# Patient Record
Sex: Female | Born: 2000 | Race: White | Hispanic: No | Marital: Married | Smoking: Never smoker
Health system: Southern US, Community
[De-identification: ages and names within clinical notes are randomized; demographics above are authoritative.]

## PROBLEM LIST (undated history)

## (undated) DIAGNOSIS — Q796 Ehlers-Danlos syndrome, unspecified: Secondary | ICD-10-CM

## (undated) DIAGNOSIS — G43909 Migraine, unspecified, not intractable, without status migrainosus: Secondary | ICD-10-CM

## (undated) DIAGNOSIS — Z8739 Personal history of other diseases of the musculoskeletal system and connective tissue: Secondary | ICD-10-CM

## (undated) DIAGNOSIS — K589 Irritable bowel syndrome without diarrhea: Secondary | ICD-10-CM

## (undated) DIAGNOSIS — F32A Depression, unspecified: Secondary | ICD-10-CM

## (undated) DIAGNOSIS — R519 Headache, unspecified: Secondary | ICD-10-CM

## (undated) DIAGNOSIS — K219 Gastro-esophageal reflux disease without esophagitis: Secondary | ICD-10-CM

## (undated) DIAGNOSIS — R55 Syncope and collapse: Secondary | ICD-10-CM

## (undated) DIAGNOSIS — K6289 Other specified diseases of anus and rectum: Secondary | ICD-10-CM

## (undated) DIAGNOSIS — S060X9A Concussion with loss of consciousness of unspecified duration, initial encounter: Secondary | ICD-10-CM

## (undated) DIAGNOSIS — M1990 Unspecified osteoarthritis, unspecified site: Secondary | ICD-10-CM

## (undated) DIAGNOSIS — M5418 Radiculopathy, sacral and sacrococcygeal region: Secondary | ICD-10-CM

## (undated) DIAGNOSIS — G90A Postural orthostatic tachycardia syndrome (POTS): Secondary | ICD-10-CM

## (undated) DIAGNOSIS — J45990 Exercise induced bronchospasm: Secondary | ICD-10-CM

## (undated) DIAGNOSIS — I951 Orthostatic hypotension: Secondary | ICD-10-CM

## (undated) DIAGNOSIS — R51 Headache: Secondary | ICD-10-CM

## (undated) DIAGNOSIS — T7840XA Allergy, unspecified, initial encounter: Secondary | ICD-10-CM

## (undated) DIAGNOSIS — G90521 Complex regional pain syndrome I of right lower limb: Secondary | ICD-10-CM

## (undated) DIAGNOSIS — Z973 Presence of spectacles and contact lenses: Secondary | ICD-10-CM

## (undated) DIAGNOSIS — G43009 Migraine without aura, not intractable, without status migrainosus: Secondary | ICD-10-CM

## (undated) DIAGNOSIS — Z87898 Personal history of other specified conditions: Secondary | ICD-10-CM

## (undated) DIAGNOSIS — J45909 Unspecified asthma, uncomplicated: Secondary | ICD-10-CM

## (undated) DIAGNOSIS — M797 Fibromyalgia: Secondary | ICD-10-CM

## (undated) DIAGNOSIS — G90523 Complex regional pain syndrome I of lower limb, bilateral: Secondary | ICD-10-CM

## (undated) DIAGNOSIS — F419 Anxiety disorder, unspecified: Secondary | ICD-10-CM

## (undated) DIAGNOSIS — M4317 Spondylolisthesis, lumbosacral region: Secondary | ICD-10-CM

## (undated) HISTORY — PX: WISDOM TOOTH EXTRACTION: SHX21

## (undated) HISTORY — DX: Syncope and collapse: R55

## (undated) HISTORY — DX: Headache: R51

## (undated) HISTORY — DX: Migraine without aura, not intractable, without status migrainosus: G43.009

## (undated) HISTORY — DX: Postural orthostatic tachycardia syndrome (POTS): G90.A

## (undated) HISTORY — DX: Allergy, unspecified, initial encounter: T78.40XA

## (undated) HISTORY — DX: Orthostatic hypotension: I95.1

## (undated) HISTORY — DX: Fibromyalgia: M79.7

## (undated) HISTORY — DX: Ehlers-Danlos syndrome, unspecified: Q79.60

## (undated) HISTORY — DX: Unspecified osteoarthritis, unspecified site: M19.90

## (undated) HISTORY — DX: Gastro-esophageal reflux disease without esophagitis: K21.9

## (undated) HISTORY — DX: Complex regional pain syndrome i of right lower limb: G90.521

## (undated) HISTORY — DX: Unspecified asthma, uncomplicated: J45.909

## (undated) HISTORY — DX: Concussion with loss of consciousness of unspecified duration, initial encounter: S06.0X9A

## (undated) HISTORY — DX: Migraine, unspecified, not intractable, without status migrainosus: G43.909

---
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
## 2021-01-01 ENCOUNTER — Other Ambulatory Visit: Payer: Self-pay

## 2021-01-01 ENCOUNTER — Encounter: Payer: Self-pay | Admitting: Neurology

## 2021-01-01 ENCOUNTER — Ambulatory Visit: Payer: No Typology Code available for payment source | Admitting: Neurology

## 2021-01-01 VITALS — BP 120/77 | HR 93 | Resp 18 | Ht 67.0 in | Wt 153.0 lb

## 2021-01-01 DIAGNOSIS — R55 Syncope and collapse: Secondary | ICD-10-CM

## 2021-01-01 DIAGNOSIS — R200 Anesthesia of skin: Secondary | ICD-10-CM | POA: Diagnosis not present

## 2021-01-01 DIAGNOSIS — R202 Paresthesia of skin: Secondary | ICD-10-CM

## 2021-01-01 DIAGNOSIS — G43009 Migraine without aura, not intractable, without status migrainosus: Secondary | ICD-10-CM

## 2021-01-01 DIAGNOSIS — G90521 Complex regional pain syndrome I of right lower limb: Secondary | ICD-10-CM

## 2021-01-01 NOTE — Patient Instructions (Addendum)
  1. Will check MRI of brain with and without contrast 2. Will check routine EEG.  If unremarkable, will check ambulatory EEG 3. Start Qulipta 60mg  daily  4. Take sumatriptan 60mg  at earliest onset of headache.  May repeat dose once in 2 hours if needed.  Maximum 2 tablets in 24 hours. 5. Limit use of pain relievers to no more than 2 days out of the week.  These medications include acetaminophen, NSAIDs (ibuprofen/Advil/Motrin, naproxen/Aleve, triptans (Imitrex/sumatriptan), Excedrin, and narcotics.  This will help reduce risk of rebound headaches. 6. Be aware of common food triggers:  - Caffeine:  coffee, black tea, cola, Mt. Dew  - Chocolate  - Dairy:  aged cheeses (brie, blue, cheddar, gouda, [place], provolone, [place], Swiss, etc), chocolate milk, buttermilk, sour cream, limit eggs and yogurt  - Nuts, peanut butter  - Alcohol  - Cereals/grains:  FRESH breads (fresh bagels, sourdough, doughnuts), yeast productions  - Processed/canned/aged/cured meats (pre-packaged deli meats, hotdogs)  - MSG/glutamate:  soy sauce, flavor enhancer, pickled/preserved/marinated foods  - Sweeteners:  aspartame (Equal, Nutrasweet).  Sugar and Splenda are okay  - Vegetables:  legumes (lima beans, lentils, snow peas, fava beans, pinto peans, peas, garbanzo beans), sauerkraut, onions, olives, pickles  - Fruit:  avocados, bananas, citrus fruit (orange, lemon, grapefruit), mango  - Other:  Frozen meals, macaroni and cheese 7. Routine exercise 8. Stay adequately hydrated (aim for 64 oz water daily) 9. Keep headache diary 10. Maintain proper stress management 11. Maintain proper sleep hygiene 12. Do not skip meals 13. Consider supplements:  magnesium citrate 400mg  daily, riboflavin 400mg  daily, coenzyme Q10 100mg  three times daily.    We have sent a referral to [facility] for your MRI and they will call you directly to schedule your appointment. They are located at [address] [facility]. If you need to  contact them directly please call [phone].

## 2021-01-07 ENCOUNTER — Other Ambulatory Visit: Payer: Self-pay | Admitting: Cardiology

## 2021-01-07 ENCOUNTER — Encounter: Payer: Self-pay | Admitting: Family Medicine

## 2021-01-07 DIAGNOSIS — G901 Familial dysautonomia [Riley-Day]: Secondary | ICD-10-CM

## 2021-01-07 MED ORDER — MIDODRINE HCL 2.5 MG PO TABS: 2.5000 mg | ORAL_TABLET | Freq: Two times a day (BID) | ORAL | 1 refills | Status: DC

## 2021-01-07 MED ORDER — MIDODRINE HCL 2.5 MG PO TABS: 2.5000 mg | ORAL_TABLET | Freq: Two times a day (BID) | ORAL | Status: DC

## 2021-01-10 ENCOUNTER — Ambulatory Visit (INDEPENDENT_AMBULATORY_CARE_PROVIDER_SITE_OTHER): Payer: No Typology Code available for payment source | Admitting: Neurology

## 2021-01-10 ENCOUNTER — Other Ambulatory Visit: Payer: Self-pay | Admitting: Neurology

## 2021-01-10 ENCOUNTER — Other Ambulatory Visit: Payer: Self-pay

## 2021-01-10 DIAGNOSIS — G43009 Migraine without aura, not intractable, without status migrainosus: Secondary | ICD-10-CM

## 2021-01-10 DIAGNOSIS — R200 Anesthesia of skin: Secondary | ICD-10-CM

## 2021-01-10 DIAGNOSIS — R202 Paresthesia of skin: Secondary | ICD-10-CM

## 2021-01-10 DIAGNOSIS — G90521 Complex regional pain syndrome I of right lower limb: Secondary | ICD-10-CM

## 2021-01-10 DIAGNOSIS — R55 Syncope and collapse: Secondary | ICD-10-CM

## 2021-01-10 MED ORDER — QULIPTA 60 MG PO TABS: 60.0000 mg | ORAL_TABLET | Freq: Every day | ORAL | 5 refills | Status: DC

## 2021-01-10 NOTE — Procedures (Signed)
ELECTROENCEPHALOGRAM REPORT  Date of Study: 01/10/2021  Patient's Name: Kiara Brewer MRN: [id number] Date of Birth: Jun 17, 2001   Clinical History: 20 year old female with migraines and chronic pain presenting with recurrent syncope  Medications: PROVENTIL HFA;VENTOLIN HFA 108 (90 Base) MCG/ACT inhaler WELLBUTRIN XL 300 MG 24 hr tablet TUMS - DOSED IN MG ELEMENTAL CALCIUM 500 MG chewable tablet CYMBALTA 60 MG capsule EPINEPHrine 0.3 mg/0.3 mL IJ SOAJ injection JUNEL 1/20 1-20 MG-MCG tablet KEPPRA 1000 MG tablet SINGULAIR 10 MG tablet MULTIVITAMIN tablet IMITREX 50 MG tablet ZANAFLEX 4 MG tablet  Technical Summary: A multichannel digital EEG recording measured by the international 10-20 system with electrodes applied with paste and impedances below 5000 ohms performed in our laboratory with EKG monitoring in an awake and asleep patient.  Photic stimulation was performed.  The digital EEG was referentially recorded, reformatted, and digitally filtered in a variety of bipolar and referential montages for optimal display.    Description: The patient is awake and asleep during the recording.  During maximal wakefulness, there is a symmetric, medium voltage 10 Hz posterior dominant rhythm that attenuates with eye opening.  The record is symmetric.  During drowsiness and sleep, there is an increase in theta slowing of the background.  Vertex waves and symmetric sleep spindles were seen.  Photic stimulation did not elicit any abnormalities.  There were no epileptiform discharges or electrographic seizures seen.    EKG lead was unremarkable.  Impression: This awake and asleep EEG is normal.    Clinical Correlation: A normal EEG does not exclude a clinical diagnosis of epilepsy.  If further clinical questions remain, prolonged EEG may be helpful.  Clinical correlation is advised.   Shon Millet, DO

## 2021-01-13 NOTE — Progress Notes (Unsigned)
Cardiology Office Note:    Date:  01/15/2021   ID:  Kiara Brewer, DOB 01/01/2001, MRN [id number]  PCP:  Orland MustardWolfe, Allison, MD   [facility]  Cardiologist:  No primary care provider on file.  Advanced Practice Provider:  No care team member to display Electrophysiologist:  None    Referring MD: Orland MustardWolfe, Allison, MD     History of Present Illness:    Kiara Brewer is a 20 y.o. female with a hx of  anxiety, complex regional pain syndrome, fibromyalgia, IBS, GERD and orthostatic hypotension who presents to clinic for follow-up of her syncope  Patient was initially seen by Dr. Lalla BrothersLambert on 09/07/20 where she presented with multiple episodes of orthostatic hypotension and passing out spells. Episodes frequently occurred with changes of position although not always. One of the episodes was preceded by facial numbness. When she passes out she is unconscious for just a couple of seconds and then regains consciousness. Almost every time she changes position from sitting to standing she feels lightheaded. She says she drinks at least a gallon of water daily. During that visit, the patient symptoms were thought to be due to orthostatic intolerance with concern that polypharmacy is contributing. Recommended to see psychiatry to help with medication titration. There was low suspicion for Ehlers-Danlos or Marfan's. Was recommended for cardiac monitor, but could only tolerate for 4 days due to skin irritation. Monitor showed HR [id number] avg 93bpm. Patient triggered events corresponded to sinus rhythm/sinus tachycardia. No SVT. Rare PACs/PVCs.  Was last seen in clinic by me on 12/17/20 where continued to faint daily and sometimes multiple times per day. Often had nausea, sharp headache, and dysequilibrium prior to fainting. Usually occurred when going from sitting to standing or after prolonged standing. Out for a couple seconds and then back to baseline mental status. When she  came back to, she feels tired and nauseas. Was able to exercise but has to take frequent breaks. Had since stopped her norco.  Called clinic due to persistent symptoms and was started on midodrine. Now returning to clinic for follow-up.  Patient states that she did not tolerate midodrine due to migraines. Continues to have syncopal episodes daily with last episode yesterday while exercising at the gym and doing bicep curls. Did not have prodromal symptoms at that time. Was out for a couple of seconds before returning to normal. She has been drinking water and gatorade throughout the day as well as been increasing her salt intake and using compression sock therapy. Nothing has seemed to help.  Past Medical History:  Diagnosis Date  . Anal pain   . Anxiety   . Complex regional pain syndrome of both lower extremities    per pt w/ right thigh numbness  . Depression   . Exercise-induced asthma   . Fibromyalgia   . GERD (gastroesophageal reflux disease)   . History of concussion 04/2019   per pt no residual  . History of Osgood-Schlatter disease   . History of syncope    ED visit 08-29-2020 w/ cp, palpitations--- pt sent for cardiology evaluation , dr Lalla Brotherslambert, office note 09-07-2020 dx orthostatic intolerence ;   echo in epic 09-14-2020 , normal;  event monitor 09-13-2020 no signfiance arrhythmia , SR/ST;  CT angio chest 09-02-2020  normal  . Irritable bowel   . Migraine without aura   . Orthostatic hypotension   . Sacral radiculopathy   . Spondylolisthesis, lumbosacral region    L5--S1  . Wears contact lenses  Past Surgical History:  Procedure Laterality Date  . RECTAL EXAM UNDER ANESTHESIA N/A 11/22/2020   Procedure: ANAL  EXAM UNDER ANESTHESIA;  Surgeon: Romie Levee, MD;  Location: [facility];  Service: General;  Laterality: N/A;  . SPHINCTEROTOMY N/A 11/22/2020   Procedure: CHEMICAL SPHINCTEROTOMY/ FISSURECTOMY;  Surgeon: Romie Levee, MD;  Location: [facility];  Service: General;  Laterality: N/A;  . TONSILLECTOMY AND ADENOIDECTOMY  2010  . WISDOM TOOTH EXTRACTION  2020    Current Medications: Current Meds  Medication Sig  . albuterol (PROVENTIL HFA;VENTOLIN HFA) 108 (90 Base) MCG/ACT inhaler Inhale 2 puffs into the lungs every 6 (six) hours as needed for wheezing or shortness of breath.  . Atogepant (QULIPTA) 60 MG TABS Take 60 mg by mouth daily.  Marland Kitchen buPROPion (WELLBUTRIN XL) 300 MG 24 hr tablet Take 1 tablet (300 mg total) by mouth daily.  . calcium carbonate (TUMS - DOSED IN MG ELEMENTAL CALCIUM) 500 MG chewable tablet Chew 1 tablet by mouth as needed for indigestion or heartburn.  . DULoxetine (CYMBALTA) 60 MG capsule TAKE 1 CAPSULE BY MOUTH EVERY DAY  . EPINEPHrine 0.3 mg/0.3 mL IJ SOAJ injection Inject 0.3 mg into the muscle daily as needed for anaphylaxis.   . fludrocortisone (FLORINEF) 0.1 MG tablet Take 1 tablet (0.1 mg total) by mouth daily.  Colleen Can 1/20 1-20 MG-MCG tablet TAKE 1 TABLET BY MOUTH DAILY. TAKE CONTINUOUSLY, SKIP PLACEBO WEEK (INS ONLY COVERS 90 DAYS)  . levETIRAcetam (KEPPRA) 1000 MG tablet Take 1 tablet (1,000 mg total) by mouth 2 (two) times daily. For nerve pain  . midodrine (PROAMATINE) 2.5 MG tablet Take 1 tablet (2.5 mg total) by mouth 2 (two) times daily with a meal.  . montelukast (SINGULAIR) 10 MG tablet Take 1 tablet (10 mg total) by mouth daily.  . Multiple Vitamin (MULTIVITAMIN) tablet Take 1 tablet by mouth daily.  . promethazine (PHENERGAN) 12.5 MG tablet Take 1 tablet (12.5 mg total) by mouth every 6 (six) hours as needed for nausea or vomiting.  . SUMAtriptan (IMITREX) 50 MG tablet Take 1 tablet (50 mg total) by mouth every 2 (two) hours as needed for migraine. May repeat in 2 hours if headache persists or recurs.  Marland Kitchen tiZANidine (ZANAFLEX) 4 MG tablet TAKE 0.5-1 TABLETS (2-4 MG TOTAL) BY MOUTH 2 (TWO) TIMES DAILY AS NEEDED FOR MUSCLE SPASMS.     Allergies:   Pecan extract allergy skin test,  Strawberry extract, Other, and Gluten meal   Social History   Socioeconomic History  . Marital status: Single    Spouse name: Not on file  . Number of children: Not on file  . Years of education: Not on file  . Highest education level: Not on file  Occupational History  . Not on file  Tobacco Use  . Smoking status: Never Smoker  . Smokeless tobacco: Never Used  Vaping Use  . Vaping Use: Never used  Substance and Sexual Activity  . Alcohol use: Never  . Drug use: Never  . Sexual activity: Not on file  Other Topics Concern  . Not on file  Social History Narrative   Kiara Brewer is in the 12th grade at [employer]; she does well academically. She lives with her parents. She enjoys going to the gym, boxing, and playing with animals   Right handed   Drinks caffeine   An apartment   Social Determinants of Health   Financial Resource Strain: Not on file  Food Insecurity: Not on  file  Transportation Needs: Not on file  Physical Activity: Not on file  Stress: Not on file  Social Connections: Not on file     Family History: The patient's family history includes Alcohol abuse in her paternal grandfather; Anxiety disorder in her brother and brother; Arthritis in her maternal grandmother and mother; Asthma in her brother, brother, and mother; COPD in her father and paternal grandfather; Cancer in her maternal grandmother, paternal grandfather, and paternal grandmother; Diabetes in her maternal grandfather; Heart attack in her maternal grandfather and paternal grandfather; Heart disease in her maternal grandfather and paternal grandfather; Hyperlipidemia in her maternal grandfather and paternal grandfather; Hypertension in her father, maternal grandfather, and paternal grandfather; Migraines in her brother, father, and mother; Miscarriages / Stillbirths in her maternal grandmother and mother; OCD in her brother; Stroke in her maternal grandfather. There is no history of Seizures, Depression, Bipolar  disorder, Schizophrenia, ADD / ADHD, or Autism.  ROS:   Please see the history of present illness.    Review of Systems  Constitutional: Positive for malaise/fatigue. Negative for chills and fever.  HENT: Negative for hearing loss.   Eyes: Negative for blurred vision and redness.  Respiratory: Negative for shortness of breath.   Cardiovascular: Positive for palpitations. Negative for chest pain, orthopnea, claudication, leg swelling and PND.  Gastrointestinal: Negative for melena, nausea and vomiting.  Genitourinary: Negative for dysuria and flank pain.  Musculoskeletal: Positive for falls and myalgias.  Neurological: Positive for dizziness and loss of consciousness.  Endo/Heme/Allergies: Negative for polydipsia.  Psychiatric/Behavioral: Negative for substance abuse.    EKGs/Labs/Other Studies Reviewed:    The following studies were reviewed today: TTE 07-Oct-2020: IMPRESSIONS  1. Left ventricular ejection fraction, by estimation, is 60 to 65%. The  left ventricle has normal function. The left ventricle has no regional  wall motion abnormalities. Left ventricular diastolic parameters were  normal.  2. Right ventricular systolic function is normal. The right ventricular  size is normal.  3. The mitral valve is normal in structure. No evidence of mitral valve  regurgitation. No evidence of mitral stenosis.  4. The aortic valve is normal in structure. Aortic valve regurgitation is  not visualized. No aortic stenosis is present.  5. The inferior vena cava is normal in size with greater than 50%  respiratory variability, suggesting right atrial pressure of 3 mmHg.   Cardiac Monitor 09/13/20: HR 43-173, average 93bpm. Patient triggered episodes correspond to sinus rhythm/sinus tachycardia. No episodes of SVT. Rare supraventricular and ventricular ectopy.   Recent Labs: 08/27/2020: TSH 1.44 10/31/2020: ALT 33; BUN 10; Creatinine, Ser 0.97; Hemoglobin 13.5; Platelets 312.0;  Potassium 4.6; Sodium 136  Recent Lipid Panel    Component Value Date/Time   CHOL 155 08/16/2018 0848   TRIG 80.0 08/16/2018 0848   HDL 58.50 08/16/2018 0848   CHOLHDL 3 08/16/2018 0848   VLDL 16.0 08/16/2018 0848   LDLCALC 81 08/16/2018 0848     Risk Assessment/Calculations:       Physical Exam:    VS:  BP 114/82   Pulse (!) 107   Ht 5\' 7"  (1.702 m)   Wt 151 lb 8 oz (68.7 kg)   SpO2 96%   BMI 23.73 kg/m     Wt Readings from Last 3 Encounters:  01/15/21 151 lb 8 oz (68.7 kg) (83 %, Z= 0.97)*  01/01/21 153 lb (69.4 kg) (85 %, Z= 1.02)*  12/21/20 152 lb (68.9 kg) (84 %, Z= 0.99)*   * Growth percentiles are based on  CDC (Girls, 2-20 Years) data.     GEN:  Well nourished, well developed in no acute distress HEENT: Normal NECK: No JVD; No carotid bruits CARDIAC: RRR, no murmurs, rubs, gallops RESPIRATORY:  Clear to auscultation without rales, wheezing or rhonchi  ABDOMEN: Soft, non-tender, non-distended MUSCULOSKELETAL:  No edema; No deformity  SKIN: Warm and dry NEUROLOGIC:  Alert and oriented x 3 PSYCHIATRIC:  Normal affect   ASSESSMENT:    1. Orthostatic intolerance   2. Syncope, unspecified syncope type   3. Fibromyalgia   4. Dysautonomia (HCC)    PLAN:    In order of problems listed above:  #Orthostatic Hypotension: #Syncope: #Dysautonomia: Patient with suspected orthostatic syncope where she usually faints when going from a seated to a standing position or after prolonged standing. Usually has preceding nausea, headache, dysequilibrium but can occur without warning. Was seen by Dr. Lalla Brothers where orthostatics were positive . There was concern that polypharmacy was contributing to symptoms as patient was on codeine, Wellbutrin, Cymbalta, Neurontin, Lyrica, Zanaflex. He recommended continued hydration, avoiding rapid changes in positions, and compression therapy. Reassured the patient and her mother that she did not appear to have POTs, Ehlers Danlos or  Marfan syndrome. TTE with normal BiV function, no significant valvular abnormalities, normal aortic root and ascending aorta size.  She has since stopped the opiate but has continued symptoms. We did a trial of midodrine which she was not able to tolerate due to migraines. Has been drinking fluid including water and gatorade, increased salt intake and worn compression socks. Given persistent symptoms, will refer to Dr. Graciela Husbands for further management. - Refer to Dr. Graciela Husbands -Trial of fludrocortisone 0.1mg  daily -Did not tolerate midodrine due to migraines -Maintain aggressive hydration with both water and gatorade/poweraide -At least 4g salt per day -Compression therapy and abdominal binders -Avoid triggers like hot showers, warm work-out rooms -Discussed that she needs frequent meals and to always have something with salt with her -Needs to go slow with changing positions -If able, take most sedating medications/vasodilating meds at night -When seated, elevate the legs at the end of the day -Continue graduated exercise as tolerated  #Fibromyalgia #Chronic pain syndrome: -Management per pain specialist   Medication Adjustments/Labs and Tests Ordered: Current medicines are reviewed at length with the patient today.  Concerns regarding medicines are outlined above.  Orders Placed This Encounter  Procedures  . Ambulatory referral to Cardiac Electrophysiology   Meds ordered this encounter  Medications  . fludrocortisone (FLORINEF) 0.1 MG tablet    Sig: Take 1 tablet (0.1 mg total) by mouth daily.    Dispense:  30 tablet    Refill:  11    Patient Instructions  Medication Instructions:  1) STOP MIDODRINE 2) START FLORINEF 0.1 mg tablets once daily *If you need a refill on your cardiac medications before your next appointment, please call your pharmacy*  Follow-Up: You have been referred to Dr. Graciela Husbands for orthostatic hypotension and syncope.    Signed, Meriam Sprague, MD   01/15/2021 12:31 PM    [facility]

## 2021-01-15 ENCOUNTER — Encounter: Payer: Self-pay | Admitting: Cardiology

## 2021-01-15 ENCOUNTER — Ambulatory Visit: Payer: No Typology Code available for payment source | Admitting: Cardiology

## 2021-01-15 ENCOUNTER — Other Ambulatory Visit: Payer: Self-pay

## 2021-01-15 VITALS — BP 114/82 | HR 107 | Ht 67.0 in | Wt 151.5 lb

## 2021-01-15 DIAGNOSIS — M797 Fibromyalgia: Secondary | ICD-10-CM

## 2021-01-15 DIAGNOSIS — G901 Familial dysautonomia [Riley-Day]: Secondary | ICD-10-CM | POA: Diagnosis not present

## 2021-01-15 DIAGNOSIS — I951 Orthostatic hypotension: Secondary | ICD-10-CM

## 2021-01-15 DIAGNOSIS — R55 Syncope and collapse: Secondary | ICD-10-CM | POA: Diagnosis not present

## 2021-01-15 MED ORDER — FLUDROCORTISONE ACETATE 0.1 MG PO TABS: 0.1000 mg | ORAL_TABLET | Freq: Every day | ORAL | 11 refills | Status: DC

## 2021-01-15 NOTE — Patient Instructions (Addendum)
Medication Instructions:  1) STOP MIDODRINE 2) START FLORINEF 0.1 mg tablets once daily *If you need a refill on your cardiac medications before your next appointment, please call your pharmacy*  Follow-Up: You have been referred to Dr. Graciela Husbands for orthostatic hypotension and syncope.

## 2021-01-16 ENCOUNTER — Encounter: Payer: Self-pay | Admitting: Gastroenterology

## 2021-01-16 ENCOUNTER — Ambulatory Visit (AMBULATORY_SURGERY_CENTER): Payer: Self-pay | Admitting: *Deleted

## 2021-01-16 ENCOUNTER — Other Ambulatory Visit: Payer: Self-pay

## 2021-01-16 DIAGNOSIS — K625 Hemorrhage of anus and rectum: Secondary | ICD-10-CM

## 2021-01-16 DIAGNOSIS — K219 Gastro-esophageal reflux disease without esophagitis: Secondary | ICD-10-CM

## 2021-01-16 DIAGNOSIS — R1310 Dysphagia, unspecified: Secondary | ICD-10-CM

## 2021-01-16 MED ORDER — PLENVU 140 G PO SOLR: 1.0000 | ORAL | 0 refills | Status: DC

## 2021-01-16 NOTE — Progress Notes (Signed)
No egg or soy allergy known to patient  No issues with past sedation with any surgeries or procedures Patient denies ever being told they had issues or difficulty with intubation  No FH of Malignant Hyperthermia No diet pills per patient No home 02 use per patient  No blood thinners per patient  Pt denies issues with constipation  No A fib or A flutter  EMMI video to pt or via MyChart  COVID 19 guidelines implemented in PV today with Pt and RN  Pt is fully vaccinated  for Covid    Due to the COVID-19 pandemic we are asking patients to follow certain guidelines.  Pt aware of COVID protocols and [facility] guidelines   Plenvu sample LOT [id number] exp 05-2022  Pt and mother instructed in PV today that it is IMPERATIVE that pt hydrates well he day prior and the morning of her procedures- instructed her to drink 8-10 oz EVERY HOUR on Monday  And up until 8 am TUES - she and her mom both verbalized understanding

## 2021-01-18 ENCOUNTER — Telehealth: Payer: Self-pay

## 2021-01-18 ENCOUNTER — Other Ambulatory Visit: Payer: Self-pay

## 2021-01-18 ENCOUNTER — Encounter: Payer: Self-pay | Admitting: Family Medicine

## 2021-01-18 ENCOUNTER — Ambulatory Visit (INDEPENDENT_AMBULATORY_CARE_PROVIDER_SITE_OTHER): Payer: No Typology Code available for payment source | Admitting: Family Medicine

## 2021-01-18 ENCOUNTER — Ambulatory Visit
Admission: RE | Admit: 2021-01-18 | Discharge: 2021-01-18 | Disposition: A | Discharge status: Home / Self Care | Payer: No Typology Code available for payment source | Source: Ambulatory Visit | Attending: Neurology | Admitting: Neurology

## 2021-01-18 VITALS — BP 124/60 | HR 97 | Temp 98.2°F | Ht 67.0 in | Wt 152.4 lb

## 2021-01-18 DIAGNOSIS — I951 Orthostatic hypotension: Secondary | ICD-10-CM

## 2021-01-18 DIAGNOSIS — G901 Familial dysautonomia [Riley-Day]: Secondary | ICD-10-CM

## 2021-01-18 MED ORDER — GADOBENATE DIMEGLUMINE 529 MG/ML IV SOLN
13.0000 mL | Freq: Once | INTRAVENOUS | Status: AC | PRN
  Administered 2021-01-18: 13 mL via INTRAVENOUS

## 2021-01-18 NOTE — Progress Notes (Signed)
Patient: Kiara Brewer MRN: [id number] DOB: 03-Jun-2001 PCP: Orland Mustard, MD     Subjective:  Chief Complaint  Patient presents with  . Loss of Consciousness    Pt is requesting a service animal. Pt says that her Cardiologist recommended her to discuss with PCP.    HPI: The patient is a 20 y.o. female who presents today to discuss a cardiac service animal. Cardiologist requests she speak with PCP. She says that PCP will have more resources. She was given options by the cardiologist as far as resources. She would like to have a trained service dog, not a puppy. I think this would be a great idea considering she is having syncopal episodes 3-4x/day. She is out for only a few seconds when it happens. She is seeing Dr. Graciela Husbands soon for further testing.   She is currently on fludrocortisone, but doesn't like this. She doesn't feel like it's making a difference as she continues to have syncopal episodes.  Going to [place] this summer. She is getting marred in July and this is her honeymoon.    Review of Systems  Constitutional: Negative for chills, fatigue and fever.  HENT: Negative for dental problem, ear pain, hearing loss and trouble swallowing.   Eyes: Negative for photophobia and visual disturbance.  Respiratory: Negative for cough, chest tightness and shortness of breath.   Cardiovascular: Negative for chest pain, palpitations and leg swelling.  Gastrointestinal: Negative for abdominal pain, blood in stool, diarrhea and nausea.  Endocrine: Negative for cold intolerance, polydipsia, polyphagia and polyuria.  Genitourinary: Negative for dysuria and hematuria.  Musculoskeletal: Negative for arthralgias.  Skin: Negative for rash.  Neurological: Positive for dizziness and syncope. Negative for weakness and headaches.  Psychiatric/Behavioral: Negative for dysphoric mood and sleep disturbance. The patient is not nervous/anxious.     Allergies Patient is allergic to pecan extract  allergy skin test, strawberry extract, other, gluten meal, and shellfish allergy.  Past Medical History Patient  has a past medical history of Anal pain, Anxiety, Complex regional pain syndrome of both lower extremities, Depression, Exercise-induced asthma, Fibromyalgia, GERD (gastroesophageal reflux disease), History of concussion (04/2019), History of Osgood-Schlatter disease, History of syncope, Irritable bowel, Migraine without aura, Orthostatic hypotension, Sacral radiculopathy, Spondylolisthesis, lumbosacral region, and Wears contact lenses.  Surgical History Patient  has a past surgical history that includes Wisdom tooth extraction (2020); Tonsillectomy and adenoidectomy (2010); Rectal exam under anesthesia (N/A, 11/22/2020); and Sphincterotomy (N/A, 11/22/2020).  Family History Pateint's family history includes Alcohol abuse in her paternal grandfather; Anxiety disorder in her brother and brother; Arthritis in her maternal grandmother and mother; Asthma in her brother, brother, and mother; COPD in her father and paternal grandfather; Cancer in her maternal grandmother, paternal grandfather, and paternal grandmother; Colon polyps in her maternal grandfather; Diabetes in her maternal grandfather; Heart attack in her maternal grandfather and paternal grandfather; Heart disease in her maternal grandfather and paternal grandfather; Hyperlipidemia in her maternal grandfather and paternal grandfather; Hypertension in her father, maternal grandfather, and paternal grandfather; Migraines in her brother, father, and mother; Miscarriages / Stillbirths in her maternal grandmother and mother; OCD in her brother; Stroke in her maternal grandfather.  Social History Patient  reports that she has never smoked. She has never used smokeless tobacco. She reports that she does not drink alcohol and does not use drugs.    Objective: Vitals:   01/18/21 0915  BP: 124/60  Pulse: 97  Temp: 98.2 F (36.8 C)   TempSrc: Temporal  SpO2: 99%  Weight:  152 lb 6.4 oz (69.1 kg)  Height: 5\' 7"  (1.702 m)    Body mass index is 23.87 kg/m.  Physical Exam Vitals reviewed.  Constitutional:      Appearance: Normal appearance. She is normal weight.  HENT:     Head: Normocephalic and atraumatic.  Cardiovascular:     Rate and Rhythm: Normal rate and regular rhythm.     Heart sounds: Normal heart sounds.  Pulmonary:     Effort: Pulmonary effort is normal.     Breath sounds: Normal breath sounds.  Abdominal:     General: Abdomen is flat. Bowel sounds are normal.     Palpations: Abdomen is soft.  Musculoskeletal:     Cervical back: Normal range of motion and neck supple.  Skin:    General: Skin is warm.     Capillary Refill: Capillary refill takes less than 2 seconds.  Neurological:     General: No focal deficit present.     Mental Status: She is alert and oriented to person, place, and time.  Psychiatric:        Mood and Affect: Mood normal.        Behavior: Behavior normal.        Assessment/plan: 1. Dysautonomia (HCC) I think a service dog would be a great idea considering she has multiple syncopal episodes a day and is at risk of hurting herself. If the dog could alert it, it could really be beneficial for her. I will write her a letter regarding this.   2. Orthostatic syncope See above, seeing Dr. for further testing.    This visit occurred during the SARS-CoV-2 public health emergency.  Safety protocols were in place, including screening questions prior to the visit, additional usage of staff PPE, and extensive cleaning of exam room while observing appropriate contact time as indicated for disinfecting solutions.      Return if symptoms worsen or fail to improve.   Graciela Husbands, MD [facility]   01/18/2021

## 2021-01-18 NOTE — Telephone Encounter (Signed)
-----   Message from Meriam Sprague, MD sent at 01/18/2021 12:32 PM EDT ----- Can we print this letter for her so she can apply for a cardiac service dog. Also very tempted to write one for [place] so we can also have a Cardiology office service dog :).   -------------------------------------------------------------------------------------------------------------------------    To Whom It May Concern,  Ms. Kiara Brewer has been suffering from recurrent syncopal episodes that have been refractory to medical management. This has resulted in numerous injuries to the patient and continues to pose a risk to her on a daily basis. She would greatly benefit from a cardiac service dog who could sense changes in her heart rate and blood pressure to notify her to lay down before she loses consciousness and falls to the ground. If you have any further questions or concerns, please do not hesitate to call our office at [phone].  Thank you very much.  Sincerely,   Laurance Flatten

## 2021-01-18 NOTE — Patient Instructions (Signed)
im so happy for you! Congratulations!  -will mail you letter so I have time to write it!   Dr. Artis Flock

## 2021-01-18 NOTE — Telephone Encounter (Addendum)
Lm for the pt to see if she would like her letter mailed to her or if she will pick it up.  Letter sent to her My Chart.

## 2021-01-21 NOTE — Telephone Encounter (Signed)
Sent letter to patient via MyCHart.  Requested she contact the office if she needs a copy mailed to her.

## 2021-01-22 NOTE — Progress Notes (Signed)
Pt advised of her Mri results.

## 2021-01-29 ENCOUNTER — Other Ambulatory Visit: Payer: Self-pay | Admitting: Gastroenterology

## 2021-01-29 ENCOUNTER — Ambulatory Visit (AMBULATORY_SURGERY_CENTER): Payer: No Typology Code available for payment source | Admitting: Gastroenterology

## 2021-01-29 ENCOUNTER — Other Ambulatory Visit: Payer: Self-pay

## 2021-01-29 ENCOUNTER — Encounter: Payer: Self-pay | Admitting: Gastroenterology

## 2021-01-29 VITALS — BP 109/61 | HR 71 | Temp 98.0°F | Resp 14 | Ht 67.0 in | Wt 150.0 lb

## 2021-01-29 DIAGNOSIS — K625 Hemorrhage of anus and rectum: Secondary | ICD-10-CM | POA: Diagnosis present

## 2021-01-29 DIAGNOSIS — R1310 Dysphagia, unspecified: Secondary | ICD-10-CM | POA: Diagnosis not present

## 2021-01-29 DIAGNOSIS — K2980 Duodenitis without bleeding: Secondary | ICD-10-CM

## 2021-01-29 DIAGNOSIS — K602 Anal fissure, unspecified: Secondary | ICD-10-CM | POA: Diagnosis not present

## 2021-01-29 DIAGNOSIS — R194 Change in bowel habit: Secondary | ICD-10-CM

## 2021-01-29 DIAGNOSIS — K219 Gastro-esophageal reflux disease without esophagitis: Secondary | ICD-10-CM

## 2021-01-29 DIAGNOSIS — K319 Disease of stomach and duodenum, unspecified: Secondary | ICD-10-CM

## 2021-01-29 DIAGNOSIS — K2100 Gastro-esophageal reflux disease with esophagitis, without bleeding: Secondary | ICD-10-CM | POA: Diagnosis not present

## 2021-01-29 DIAGNOSIS — R109 Unspecified abdominal pain: Secondary | ICD-10-CM

## 2021-01-29 DIAGNOSIS — K3189 Other diseases of stomach and duodenum: Secondary | ICD-10-CM

## 2021-01-29 MED ORDER — OMEPRAZOLE 20 MG PO CPDR: 20.0000 mg | DELAYED_RELEASE_CAPSULE | Freq: Every day | ORAL | 1 refills | Status: DC

## 2021-01-29 MED ORDER — SODIUM CHLORIDE 0.9 % IV SOLN: 500.0000 mL | Freq: Once | INTRAVENOUS | Status: AC

## 2021-01-29 MED ORDER — AMBULATORY NON FORMULARY MEDICATION: 3 refills | Status: DC

## 2021-01-29 NOTE — Patient Instructions (Signed)
YOU HAD AN ENDOSCOPIC PROCEDURE TODAY AT THE [facility]:   Refer to the procedure report that was given to you for any specific questions about what was found during the examination.  If the procedure report does not answer your questions, please call your gastroenterologist to clarify.  If you requested that your care partner not be given the details of your procedure findings, then the procedure report has been included in a sealed envelope for you to review at your convenience later.  YOU SHOULD EXPECT: Some feelings of bloating in the abdomen. Passage of more gas than usual.  Walking can help get rid of the air that was put into your GI tract during the procedure and reduce the bloating. If you had a lower endoscopy (such as a colonoscopy or flexible sigmoidoscopy) you may notice spotting of blood in your stool or on the toilet paper. If you underwent a bowel prep for your procedure, you may not have a normal bowel movement for a few days.  Please Note:  You might notice some irritation and congestion in your nose or some drainage.  This is from the oxygen used during your procedure.  There is no need for concern and it should clear up in a day or so.  SYMPTOMS TO REPORT IMMEDIATELY:   Following lower endoscopy (colonoscopy or flexible sigmoidoscopy):  Excessive amounts of blood in the stool  Significant tenderness or worsening of abdominal pains  Swelling of the abdomen that is new, acute  Fever of 100F or higher   Following upper endoscopy (EGD)  Vomiting of blood or coffee ground material  New chest pain or pain under the shoulder blades  Painful or persistently difficult swallowing  New shortness of breath  Fever of 100F or higher  Black, tarry-looking stools  For urgent or emergent issues, a gastroenterologist can be reached at any hour by calling [phone]. Do not use MyChart messaging for urgent concerns.    DIET:  We do recommend a small meal at first, but  then you may proceed to your regular diet.  Drink plenty of fluids but you should avoid alcoholic beverages for 24 hours.  ACTIVITY:  You should plan to take it easy for the rest of today and you should NOT DRIVE or use heavy machinery until tomorrow (because of the sedation medicines used during the test).    FOLLOW UP: Our staff will call the number listed on your records 48-72 hours following your procedure to check on you and address any questions or concerns that you may have regarding the information given to you following your procedure. If we do not reach you, we will leave a message.  We will attempt to reach you two times.  During this call, we will ask if you have developed any symptoms of COVID 19. If you develop any symptoms (ie: fever, flu-like symptoms, shortness of breath, cough etc.) before then, please call [phone].  If you test positive for Covid 19 in the 2 weeks post procedure, please call and report this information to us.    If any biopsies were taken you will be contacted by phone or by letter within the next 1-3 weeks.  Please call us at [phone] if you have not heard about the biopsies in 3 weeks.    SIGNATURES/CONFIDENTIALITY: You and/or your care partner have signed paperwork which will be entered into your electronic medical record.  These signatures attest to the fact that that the information above on   your After Visit Summary has been reviewed and is understood.  Full responsibility of the confidentiality of this discharge information lies with you and/or your care-partner. 

## 2021-01-29 NOTE — Progress Notes (Signed)
Pt's states no medical or surgical changes since previsit or office visit.  CHECK-IN-AER  VITAL SIGNS-CW 

## 2021-01-29 NOTE — Progress Notes (Signed)
pt tolerated well. VSS. awake and to recovery. Report given to RN. Bite block left insitu to recovery. 

## 2021-01-29 NOTE — Op Note (Addendum)
[facility] Patient Name: Kiara Brewer Procedure Date: 01/29/2021 10:57 AM MRN: [id number] Endoscopist: Viviann Spare P. Adela Lank , MD Age: 20 Referring MD:  Date of Birth: 2001-09-10 Gender: Female Account #: [id number] Procedure:                Colonoscopy Indications:              Rectal bleeding, altered bowel habits (loose and                            constipated stools), abdominal pain, previously                            treated for fissue with topical diltiazem ointment,                            topical nitroglycerin (caused headache), Botox                            injection per surgery, and on metamucil Medicines:                Monitored Anesthesia Care Procedure:                Pre-Anesthesia Assessment:                           - Prior to the procedure, a History and Physical                            was performed, and patient medications and                            allergies were reviewed. The patient's tolerance of                            previous anesthesia was also reviewed. The risks                            and benefits of the procedure and the sedation                            options and risks were discussed with the patient.                            All questions were answered, and informed consent                            was obtained. Prior Anticoagulants: The patient has                            taken no previous anticoagulant or antiplatelet                            agents. ASA Grade Assessment: II - A patient with  mild systemic disease. After reviewing the risks                            and benefits, the patient was deemed in                            satisfactory condition to undergo the procedure.                           After obtaining informed consent, the colonoscope                            was passed under direct vision. Throughout the                            procedure, the  patient's blood pressure, pulse, and                            oxygen saturations were monitored continuously. The                            Olympus PFC-H190DL (#[id number]) Colonoscope was                            introduced through the anus and advanced to the the                            terminal ileum, with identification of the                            appendiceal orifice and IC valve. The colonoscopy                            was performed without difficulty. The patient                            tolerated the procedure well. The quality of the                            bowel preparation was adequate. The terminal ileum,                            ileocecal valve, appendiceal orifice, and rectum                            were photographed. Scope In: 11:21:52 AM Scope Out: 11:45:05 AM Scope Withdrawal Time: 0 hours 18 minutes 22 seconds  Total Procedure Duration: 0 hours 23 minutes 13 seconds  Findings:                 An anal fissure was found on perianal exam in the                            anterior midline location.  The terminal ileum appeared normal.                           Internal hemorrhoids were found during                            retroflexion. The hemorrhoids were small.                           Anal papilla(e) were hypertrophied.                           The exam was otherwise without abnormality. No                            overt inflammatory changes. The prep was adequate                            but several minutes taken to lavage the colon to                            achieve adequate views.                           Biopsies for histology were taken with a cold                            forceps from the right colon, left colon and                            transverse colon for evaluation of microscopic                            colitis. Complications:            No immediate complications. Estimated blood loss:                             Minimal. Estimated Blood Loss:     Estimated blood loss was minimal. Impression:               - Anal fissure found on perianal exam.                           - The examined portion of the ileum was normal.                           - Internal hemorrhoids.                           - Anal papilla(e) were hypertrophied.                           - The examination was otherwise normal.                           - Biopsies were taken with  a cold forceps from the                            right colon, left colon and transverse colon for                            evaluation of microscopic colitis.                           Anal fissure is the likely cause for bleeding                            symptoms. Recommendation:           - Patient has a contact number available for                            emergencies. The signs and symptoms of potential                            delayed complications were discussed with the                            patient. Return to normal activities tomorrow.                            Written discharge instructions were provided to the                            patient.                           - Resume previous diet.                           - Continue present medications.                           - Stop metamucil, trial of Citrucel once daily                            (will be better tolerated)                           - Given intolerance of topical nitroglycerin, would                            continue topical diltiazem ointment applied three                            times daily, pea sized amount. Follow up with                            general surgery if fissure persists                           - Await  pathology results. Viviann Spare P. Hagen Tidd, MD 01/29/2021 11:51:13 AM This report has been signed electronically.

## 2021-01-29 NOTE — Op Note (Signed)
Kiara Brewer Patient Name: Kiara Brewer Procedure Date: 01/29/2021 10:58 AM MRN: [id number] Endoscopist: Viviann Spare P. Adela Lank , MD Age: 20 Referring MD:  Date of Birth: 2001/03/07 Gender: Female Account #: [id number] Procedure:                Upper GI endoscopy Indications:              Upper abdominal pain, Dysphagia, Suspected                            gastro-esophageal reflux disease, history of                            reported hematemesis, patient with history of                            gluten intolerance and some food allergies Medicines:                Monitored Anesthesia Care Procedure:                Pre-Anesthesia Assessment:                           - Prior to the procedure, a History and Physical                            was performed, and patient medications and                            allergies were reviewed. The patient's tolerance of                            previous anesthesia was also reviewed. The risks                            and benefits of the procedure and the sedation                            options and risks were discussed with the patient.                            All questions were answered, and informed consent                            was obtained. Prior Anticoagulants: The patient has                            taken no previous anticoagulant or antiplatelet                            agents. ASA Grade Assessment: II - A patient with                            mild systemic disease. After reviewing the risks  and benefits, the patient was deemed in                            satisfactory condition to undergo the procedure.                           After obtaining informed consent, the endoscope was                            passed under direct vision. Throughout the                            procedure, the patient's blood pressure, pulse, and                            oxygen saturations  were monitored continuously. The                            Endoscope was introduced through the mouth, and                            advanced to the second part of duodenum. The upper                            GI endoscopy was accomplished without difficulty.                            The patient tolerated the procedure well. Scope In: Scope Out: Findings:                 Esophagogastric landmarks were identified: the                            Z-line was found at 37 cm, the gastroesophageal                            junction was found at 37 cm and the upper extent of                            the gastric folds was found at 37 cm from the                            incisors.                           LA Grade A esophagitis was found 37 cm from the                            incisors.                           The exam of the esophagus was otherwise normal. No  obvious stenosis / stricture appreciated.                           A guidewire was placed and the scope was withdrawn.                            Empiric dilation was performed in the entire                            esophagus with a Savary dilator with mild                            resistance at 17 mm. Relook endoscopy showed no                            mucosal wrents. Biopsies were taken with a cold                            forceps in the upper third of the esophagus, in the                            middle third of the esophagus and in the lower                            third of the esophagus for histology.                           The entire examined stomach was normal. Biopsies                            were taken with a cold forceps for Helicobacter                            pylori testing.                           The duodenal bulb and second portion of the                            duodenum were normal. Biopsies for histology were                            taken with a cold  forceps for evaluation of celiac                            disease. Complications:            No immediate complications. Estimated blood loss:                            Minimal. Estimated Blood Loss:     Estimated blood loss was minimal. Impression:               - Esophagogastric landmarks identified.                           -  LA Grade A reflux esophagitis.                           - Normal esophagus otherwise - empiric dilation                            performed to 58mm and biopsies obtained.                           - Normal stomach. Biopsied.                           - Normal duodenal bulb and second portion of the                            duodenum. Biopsied. Recommendation:           - Patient has a contact number available for                            emergencies. The signs and symptoms of potential                            delayed complications were discussed with the                            patient. Return to normal activities tomorrow.                            Written discharge instructions were provided to the                            patient.                           - Resume previous diet.                           - Continue present medications.                           - Await pathology results.                           - Trial of omeprazole 20mg  / day if patient has not                            tried PPI yet P. Eternity Dexter, MD 01/29/2021 12:01:14 PM This report has been signed electronically.

## 2021-01-29 NOTE — Progress Notes (Signed)
Patient spit out bite block per Ellwood Sayers, CRNA.

## 2021-01-31 ENCOUNTER — Telehealth: Payer: Self-pay

## 2021-01-31 NOTE — Telephone Encounter (Signed)
  Follow up Call-  Call back number 01/29/2021  Post procedure Call Back phone  # [phone]  Permission to leave phone message Yes  Some recent data might be hidden     Patient questions:  Do you have a fever, pain , or abdominal swelling? No. Pain Score  0 *  Have you tolerated food without any problems? Yes.    Have you been able to return to your normal activities? Yes.    Do you have any questions about your discharge instructions: Diet   No. Medications  No. Follow up visit  No.  Do you have questions or concerns about your Care? No.  Actions: * If pain score is 4 or above: No action needed, pain <4.   1. Have you developed a fever since your procedure? No   2.   Have you had an respiratory symptoms (SOB or cough) since your procedure? No   3.   Have you tested positive for COVID 19 since your procedure? No   4.   Have you had any family members/close contacts diagnosed with the COVID 19 since your procedure?  No    If yes to any of these questions please route to Laverna Peace, RN and Karlton Lemon, RN

## 2021-02-04 ENCOUNTER — Encounter: Payer: Self-pay | Admitting: Neurology

## 2021-02-04 ENCOUNTER — Telehealth: Payer: Self-pay

## 2021-02-04 ENCOUNTER — Other Ambulatory Visit: Payer: Self-pay

## 2021-02-04 ENCOUNTER — Ambulatory Visit (INDEPENDENT_AMBULATORY_CARE_PROVIDER_SITE_OTHER): Payer: No Typology Code available for payment source | Admitting: Neurology

## 2021-02-04 DIAGNOSIS — R55 Syncope and collapse: Secondary | ICD-10-CM

## 2021-02-04 NOTE — Telephone Encounter (Signed)
-----   Message from Vallarie Mare sent at 02/04/2021 10:22 AM EDT ----- Regarding: need order Will you please place an order for an ambulatory EEG for her? Thanks

## 2021-02-07 NOTE — Procedures (Signed)
ELECTROENCEPHALOGRAM REPORT  Dates of Recording: 02/04/2021 at 07:14 to 02/06/2021 at 07:45  Patient's Name: Kiara Brewer MRN: [id number] Date of Birth: May 07, 2001  Procedure: 48-hour ambulatory EEG  History: 20 year old female with chronic pain syndrome and migraines who presents for recurrent syncope  Medications:  PROVENTIL HFA;VENTOLIN HFA 108 (90 Base) MCG/ACT inhaler WELLBUTRIN XL 300 MG 24 hr tablet TUMS - DOSED IN MG ELEMENTAL CALCIUM 500 MG chewable tablet CYMBALTA 60 MG capsule EPINEPHrine 0.3 mg/0.3 mL IJ SOAJ injection JUNEL 1/20 1-20 MG-MCG tablet KEPPRA 1000 MG tablet SINGULAIR 10 MG tablet MULTIVITAMIN tablet IMITREX 50 MG tablet ZANAFLEX 4 MG tablet  Technical Summary: This is a 48-hour multichannel digital EEG recording measured by the international 10-20 system with electrodes applied with paste and impedances below 5000 ohms performed as portable with EKG monitoring.  The digital EEG was referentially recorded, reformatted, and digitally filtered in a variety of bipolar and referential montages for optimal display.    DESCRIPTION OF RECORDING: During maximal wakefulness, the background activity consisted of a symmetric 10Hz  posterior dominant rhythm which was reactive to eye opening.  There were no epileptiform discharges or focal slowing seen in wakefulness.  During the recording, the patient progresses through wakefulness, drowsiness, and Stage 2 sleep.  Again, there were no epileptiform discharges seen.  Events:  02/04/2021 at 21:02 - Patient reported fainting.  On video, patient stood up from laying supine on the floor, stood for 3 to 4 seconds and then fell.  Leaning on elbow for a few seconds before sitting up  .  No convulsions.  There were no electrographic seizures seen.  EKG lead was unremarkable.  02/05/2021 at 20:56 - Patient reported fainting.  Video not available.  There were no electrographic seizures seen.  EKG lead was  unremarkable.  02/05/2021 at 21:55 - Patient reported fainting.  Video not available.  There were no electrographic seizures seen.  EKG lead was unremarkable.  IMPRESSION: This 48-hour ambulatory EEG study is normal.    CLINICAL CORRELATION: 3 of patient's habitual events captured without electrographic correlate, indicating these are not epileptic events.     04/07/2021, DO

## 2021-02-13 NOTE — Telephone Encounter (Signed)
Error

## 2021-02-18 ENCOUNTER — Encounter: Payer: No Typology Code available for payment source | Admitting: Physical Medicine and Rehabilitation

## 2021-03-07 ENCOUNTER — Other Ambulatory Visit: Payer: Self-pay | Admitting: Obstetrics and Gynecology

## 2021-03-13 ENCOUNTER — Other Ambulatory Visit: Payer: Self-pay

## 2021-03-13 ENCOUNTER — Other Ambulatory Visit: Payer: Self-pay | Admitting: Obstetrics and Gynecology

## 2021-03-13 NOTE — Telephone Encounter (Signed)
Last AEX 12/12/19. Refill denied with note to call the office to schedule an appt. Prior to refill.

## 2021-03-14 ENCOUNTER — Ambulatory Visit: Payer: No Typology Code available for payment source | Admitting: Internal Medicine

## 2021-03-14 ENCOUNTER — Encounter: Payer: Self-pay | Admitting: Internal Medicine

## 2021-03-14 ENCOUNTER — Other Ambulatory Visit: Payer: Self-pay

## 2021-03-14 VITALS — BP 114/73 | HR 89 | Ht 67.0 in | Wt 150.0 lb

## 2021-03-14 DIAGNOSIS — I951 Orthostatic hypotension: Secondary | ICD-10-CM | POA: Diagnosis not present

## 2021-03-14 DIAGNOSIS — G901 Familial dysautonomia [Riley-Day]: Secondary | ICD-10-CM

## 2021-03-14 MED ORDER — BISOPROLOL FUMARATE 5 MG PO TABS: 2.5000 mg | ORAL_TABLET | Freq: Every day | ORAL | 0 refills | Status: DC

## 2021-03-14 MED ORDER — METOPROLOL TARTRATE 25 MG PO TABS: 12.5000 mg | ORAL_TABLET | Freq: Two times a day (BID) | ORAL | 0 refills | Status: DC

## 2021-03-14 MED ORDER — ATENOLOL 25 MG PO TABS: 12.5000 mg | ORAL_TABLET | Freq: Every day | ORAL | 0 refills | Status: DC

## 2021-03-14 MED ORDER — SUMATRIPTAN SUCCINATE 50 MG PO TABS: 50.0000 mg | ORAL_TABLET | ORAL | 5 refills | Status: DC | PRN

## 2021-03-14 NOTE — Patient Instructions (Addendum)
Medication Instructions:  - Your physician has recommended you make the following change in your medication:   You have been given 3 different beta blockers to try. You may take them in any order, but DO NOT take more than 1 at a time. Please try to give at least 2 weeks on each medication to see what works the best for you. If you decide which 1 you like, please call the office at [phone] so we may send in additional refills for you.   1) Atenolol 25 mg- take 0.5 tablet (12.5 mg) by mouth ONCE daily  2) Metoprolol tartrate 25 mg- take 0.5 tablet (12.5 mg) by mouth TWICE daily  3) Bisoprolol 5 mg- take 0.5 tablet (2.5 mg) by mouth ONCE daily   *If you need a refill on your cardiac medications before your next appointment, please call your pharmacy*   Lab Work: - none ordered  If you have labs (blood work) drawn today and your tests are completely normal, you will receive your results only by: Marland Kitchen MyChart Message (if you have MyChart) OR . A paper copy in the mail If you have any lab test that is abnormal or we need to change your treatment, we will call you to review the results.   Testing/Procedures: - none ordered   Follow-Up: At [facility], you and your health needs are our priority.  As part of our continuing mission to provide you with exceptional heart care, we have created designated Provider Care Teams.  These Care Teams include your primary Cardiologist (physician) and Advanced Practice Providers (APPs -  Physician Assistants and Nurse Practitioners) who all work together to provide you with the care you need, when you need it.  We recommend signing up for the patient portal called "MyChart".  Sign up information is provided on this After Visit Summary.  MyChart is used to connect with patients for Virtual Visits (Telemedicine).  Patients are able to view lab/test results, encounter notes, upcoming appointments, etc.  Non-urgent messages can be sent to your provider as  well.   To learn more about what you can do with MyChart, go to [email or web address].    Your next appointment:   3-4 month(s)  The format for your next appointment:   In Person  Provider:   Sherryl Manges, MD   Other Instructions n/a
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
## 2021-03-23 ENCOUNTER — Other Ambulatory Visit: Payer: Self-pay | Admitting: Obstetrics and Gynecology

## 2021-03-29 ENCOUNTER — Telehealth: Payer: Self-pay | Admitting: *Deleted

## 2021-03-29 NOTE — Telephone Encounter (Signed)
Patient called requesting refill on birth control pills, overdue for annual exam. I transferred to appointments to schedule, then refill can be sent.

## 2021-04-01 MED ORDER — NORETHINDRONE ACET-ETHINYL EST 1-20 MG-MCG PO TABS: ORAL_TABLET | ORAL | 0 refills | Status: DC

## 2021-04-01 NOTE — Telephone Encounter (Signed)
annual exam scheduled on 05/03/21. Rx sent.

## 2021-04-03 ENCOUNTER — Encounter
Payer: No Typology Code available for payment source | Attending: Physical Medicine and Rehabilitation | Admitting: Physical Medicine and Rehabilitation

## 2021-04-03 ENCOUNTER — Encounter: Payer: Self-pay | Admitting: Physical Medicine and Rehabilitation

## 2021-04-03 ENCOUNTER — Other Ambulatory Visit: Payer: Self-pay

## 2021-04-03 VITALS — BP 107/78 | HR 87 | Temp 98.2°F | Ht 67.0 in | Wt 149.4 lb

## 2021-04-03 DIAGNOSIS — G43019 Migraine without aura, intractable, without status migrainosus: Secondary | ICD-10-CM | POA: Diagnosis present

## 2021-04-03 DIAGNOSIS — M797 Fibromyalgia: Secondary | ICD-10-CM | POA: Diagnosis not present

## 2021-04-03 DIAGNOSIS — M7918 Myalgia, other site: Secondary | ICD-10-CM | POA: Diagnosis present

## 2021-04-03 NOTE — Progress Notes (Signed)
Subjective:    Patient ID: Kiara Brewer, female    DOB: 2001-01-15, 20 y.o.   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   Went and saw Dr Clide Cliff- gave her 3 different B blockers- and trying them out- trying to see which one works the best for her.  They all cause her dizziness.  Tried 1st and 2nd- and trying 3rd one today.   Didn't add a new medicine last time.  Feels like would be doing better, but missed trP inj last time and feels like they make a big difference.    Fainting wise- still fainting.    Service dog- on schedule to get a service dog.    Pain Inventory Average Pain 7 Pain Right Now 6 My pain is constant, dull and aching  In the last 24 hours, has pain interfered with the following? General activity 5 Relation with others 5 Enjoyment of life 5 What TIME of day is your pain at its worst? morning , daytime, evening, night and varies Sleep (in general) Fair  Pain is worse with: unsure and some activites Pain improves with: medication and injections Relief from Meds: 6  Family History  Problem Relation Age of Onset  . Arthritis Mother   . Asthma Mother   . Miscarriages / [place] Mother   . Migraines Mother   . COPD Father        per pt's mother-mild  . Hypertension Father   . Migraines Father   . Asthma Brother   . Migraines Brother   . Anxiety disorder Brother   . Arthritis Maternal Grandmother   . Cancer Maternal Grandmother   . Miscarriages / Stillbirths Maternal Grandmother   . Diabetes Maternal Grandfather   . Heart attack Maternal Grandfather   . Heart disease Maternal Grandfather   . Hyperlipidemia Maternal  Grandfather   . Hypertension Maternal Grandfather   . Stroke Maternal Grandfather   . Colon polyps Maternal Grandfather   . Cancer Paternal Grandmother   . Alcohol abuse Paternal Grandfather   . Cancer Paternal Grandfather   . COPD Paternal Grandfather   . Heart attack Paternal Grandfather   . Heart disease Paternal Grandfather   . Hyperlipidemia Paternal Grandfather   . Hypertension Paternal Grandfather   . Asthma Brother   . OCD Brother   . Anxiety disorder Brother   . Seizures Neg Hx   . Depression Neg Hx   . Bipolar disorder Neg Hx   . Schizophrenia Neg Hx   . ADD / ADHD Neg Hx   . Autism Neg Hx   . Colon cancer Neg Hx   . Esophageal cancer Neg Hx   . Rectal cancer Neg Hx   . Stomach cancer Neg Hx    Social History   Socioeconomic History  . Marital status: Single    Spouse name: Not on file  . Number of children: Not on file  . Years of education: Not on file  . Highest education level: Not on file  Occupational History  . Not on file  Tobacco Use  . Smoking status: Never Smoker  . Smokeless tobacco: Never Used  Vaping Use  . Vaping  Use: Never used  Substance and Sexual Activity  . Alcohol use: Never  . Drug use: Never  . Sexual activity: Not on file  Other Topics Concern  . Not on file  Social History Narrative   Kiara Brewer is in the 12th grade at [employer]; she does well academically. She lives with her parents. She enjoys going to the gym, boxing, and playing with animals   Right handed   Drinks caffeine   An apartment   Social Determinants of Health   Financial Resource Strain: Not on file  Food Insecurity: Not on file  Transportation Needs: Not on file  Physical Activity: Not on file  Stress: Not on file  Social Connections: Not on file   Past Surgical History:  Procedure Laterality Date  . RECTAL EXAM UNDER ANESTHESIA N/A 11/22/2020   Procedure: ANAL  EXAM UNDER ANESTHESIA;  Surgeon: Romie Levee, MD;  Location: [facility];  Service:  General;  Laterality: N/A;  . SPHINCTEROTOMY N/A 11/22/2020   Procedure: CHEMICAL SPHINCTEROTOMY/ FISSURECTOMY;  Surgeon: Romie Levee, MD;  [facility];  Service: General;  Laterality: N/A;  . TONSILLECTOMY AND ADENOIDECTOMY  2010  . WISDOM TOOTH EXTRACTION  2020   Past Surgical History:  Procedure Laterality Date  . RECTAL EXAM UNDER ANESTHESIA N/A 11/22/2020   Procedure: ANAL  EXAM UNDER ANESTHESIA;  Surgeon: Romie Levee, MD;  Location: [facility];  Service: General;  Laterality: N/A;  . SPHINCTEROTOMY N/A 11/22/2020   Procedure: CHEMICAL SPHINCTEROTOMY/ FISSURECTOMY;  Surgeon: Romie Levee, MD;  Location: [facility];  Service: General;  Laterality: N/A;  . TONSILLECTOMY AND ADENOIDECTOMY  2010  . WISDOM TOOTH EXTRACTION  2020   Past Medical History:  Diagnosis Date  . Anal pain   . Anxiety   . Complex regional pain syndrome of both lower extremities    per pt w/ right thigh numbness  . Depression   . Exercise-induced asthma   . Fibromyalgia   . GERD (gastroesophageal reflux disease)   . History of concussion 04/2019   per pt no residual  . History of Osgood-Schlatter disease   . History of syncope    ED visit 08-29-2020 w/ cp, palpitations--- pt sent for cardiology evaluation , dr Lalla Brothers, office note 09-07-2020 dx orthostatic intolerence ;   echo in epic 09-14-2020 , normal;  event monitor 09-13-2020 no signfiance arrhythmia , SR/ST;  CT angio chest 09-02-2020  normal  . Irritable bowel   . Migraine without aura   . Orthostatic hypotension   . Sacral radiculopathy   . Spondylolisthesis, lumbosacral region    L5--S1  . Wears contact lenses    BP 107/78   Pulse 87   Temp 98.2 F (36.8 C)   Ht 5\' 7"  (1.702 m)   Wt 149 lb 6.4 oz (67.8 kg)   SpO2 100%   BMI 23.40 kg/m   Opioid Risk Score:   Fall Risk Score:  `1  Depression screen PHQ 2/9  Depression screen [facility] 2/9 01/18/2021 09/03/2020 06/28/2020 05/04/2020  04/20/2020  Decreased Interest 0 1 2 0 0  Down, Depressed, Hopeless 0 1 3 0 0  PHQ - 2 Score 0 2 5 0 0  Altered sleeping - 3 3 - -  Tired, decreased energy - 3 3 - -  Change in appetite - 2 2 - -  Feeling bad or failure about yourself  - 1 3 - -  Trouble concentrating - 1 2 - -  Moving  slowly or fidgety/restless - 1 2 - -  Suicidal thoughts - 0 1 - -  PHQ-9 Score - 13 21 - -  Difficult doing work/chores - Very difficult Very difficult - -     Review of Systems  Constitutional: Negative.   HENT: Negative.   Eyes: Negative.   Respiratory: Negative.   Cardiovascular: Negative.   Gastrointestinal: Negative.   Endocrine: Negative.   Genitourinary: Negative.   Musculoskeletal: Positive for back pain.       Left and right hip pain  Skin: Negative.   Allergic/Immunologic: Negative.   Neurological: Positive for dizziness and headaches.  Hematological: Negative.   Psychiatric/Behavioral: Negative.        Objective:   Physical Exam  Awake, alert, appropriate, accompanied by mother, NAD Anxious affect- hates needles Has 5 piercing sin L ear.   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 Still fainting out- frequently- heading in right direction.   2. Getting service dog- going to get out of [employer] [employer]-   3. Patient here for trigger point injections for myofascial pain  Consent done and on chart.  Cleaned areas with alcohol and injected using a 27 gauge 1.5 inch needle  Injected 2cc Using 1% Lidocaine with no EPI  Upper traps Levators Posterior scalenes Middle scalenes Splenius Capitus Pectoralis Major Rhomboids Infraspinatus Teres  Major/minor Thoracic paraspinals Lumbar paraspinals B/L x2  Other injections-    Patient's level of pain prior was 7/10 Current level of pain after injections is 7/10- says it takes a few days to work.   There was no bleeding or complications.  Patient was advised to drink a lot of water on day after injections to flush system Will have increased soreness for 12-48 hours after injections.  Can use Lidocaine patches the day AFTER injections Can use theracane on day of injections in places didn't inject Can use heating pad 4-6 hours AFTER injections   4. Still on keppra  500 mg BID, Zanaflex QHS at night and Imitrex prn.   5. F/U in 2 months.    6. Pt having more migraines- suggest using imitrex- was having difficulty getting it. Suggested getting them filled EVERY month, no matter what.

## 2021-04-03 NOTE — Patient Instructions (Signed)
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 Still fainting out- frequently- heading in right direction.   2. Getting service dog- going to get out of [employer]-   3. Patient here for trigger point injections for myofascial pain  Consent done and on chart.  Cleaned areas with alcohol and injected using a 27 gauge 1.5 inch needle  Injected 2cc Using 1% Lidocaine with no EPI  Upper traps Levators Posterior scalenes Middle scalenes Splenius Capitus Pectoralis Major Rhomboids Infraspinatus Teres Major/minor Thoracic paraspinals Lumbar paraspinals B/L x2  Other injections-    Patient's level of pain prior was 7/10 Current level of pain after injections is 7/10- says it takes a few days to work.   There was no bleeding or complications.  Patient was advised to drink a lot of water on day after injections to flush system Will have increased soreness for 12-48 hours after injections.  Can use Lidocaine patches the day AFTER injections Can use theracane on day of injections in places didn't inject Can use heating pad 4-6 hours AFTER injections   4. Still on keppra  500 mg BID, Zanaflex QHS at night and Imitrex prn.   5. F/U in 2 months.    6. Pt having more migraines- suggest using imitrex- was having difficulty getting it. Suggested getting them filled EVERY month, no matter what.

## 2021-04-14 ENCOUNTER — Other Ambulatory Visit: Payer: Self-pay | Admitting: Internal Medicine

## 2021-04-22 ENCOUNTER — Other Ambulatory Visit: Payer: Self-pay | Admitting: Internal Medicine

## 2021-05-03 ENCOUNTER — Encounter: Payer: Self-pay | Admitting: Obstetrics and Gynecology

## 2021-05-03 ENCOUNTER — Ambulatory Visit (INDEPENDENT_AMBULATORY_CARE_PROVIDER_SITE_OTHER): Payer: Self-pay | Admitting: Obstetrics and Gynecology

## 2021-05-03 ENCOUNTER — Other Ambulatory Visit: Payer: Self-pay

## 2021-05-03 VITALS — BP 116/76 | HR 85 | Ht 67.0 in | Wt 152.0 lb

## 2021-05-03 DIAGNOSIS — Z01419 Encounter for gynecological examination (general) (routine) without abnormal findings: Secondary | ICD-10-CM

## 2021-05-03 DIAGNOSIS — Z113 Encounter for screening for infections with a predominantly sexual mode of transmission: Secondary | ICD-10-CM

## 2021-05-03 DIAGNOSIS — Z3041 Encounter for surveillance of contraceptive pills: Secondary | ICD-10-CM

## 2021-05-03 MED ORDER — NORETHINDRONE ACET-ETHINYL EST 1-20 MG-MCG PO TABS
ORAL_TABLET | ORAL | 4 refills | Status: DC
Start: 2021-05-03 — End: 2021-06-25

## 2021-05-03 NOTE — Patient Instructions (Signed)

## 2021-05-03 NOTE — Progress Notes (Signed)
20 y.o. G0P0000 Single White or Caucasian Not Hispanic or Latino female here for annual exam.   She is on continuous OCP's, no cycles.  She is sexually active, together for a year. Getting married this month. No dyspareunia.    She had surgery with Dr Maisie Fus in 1/22 to remove an anal fissure.   She has been diagnosed with POTS and vasovagal syncope in the last year. She is on beta blockers and getting a service dog.   No LMP recorded. (Menstrual status: Oral contraceptives).          Sexually active: Yes.    The current method of family planning is OCP (estrogen/progesterone).    Exercising: Yes.    Gym/ health club routine includes cardio and light weights. Smoker:  no  Health Maintenance: Pap:  never History of abnormal Pap:  no MMG:  N/A BMD:   N/A Colonoscopy:N/A TDaP:  01-02-14 Gardasil: 05-2016,04-2014. Wants to hold on her last shot until next year.   reports that she has never smoked. She has never used smokeless tobacco. She reports that she does not drink alcohol and does not use drugs. She will be starting her second year to [employer] in the fall, will transfer the following year to [employer]. She wants to teach elementary school teacher.   Past Medical History:  Diagnosis Date   Anal pain    Anxiety    Complex regional pain syndrome of both lower extremities    per pt w/ right thigh numbness   Depression    Exercise-induced asthma    Fainting episodes    Fibromyalgia    GERD (gastroesophageal reflux disease)    History of concussion 04/2019   per pt no residual   History of Osgood-Schlatter disease    History of syncope    ED visit 08-29-2020 w/ cp, palpitations--- pt sent for cardiology evaluation , dr Lalla Brothers, office note 09-07-2020 dx orthostatic intolerence ;   echo in epic 09-14-2020 , normal;  event monitor 09-13-2020 no signfiance arrhythmia , SR/ST;  CT angio chest 09-02-2020  normal   Irritable bowel    Migraine without aura    Orthostatic hypotension    Sacral  radiculopathy    Spondylolisthesis, lumbosacral region    L5--S1   Wears contact lenses     Past Surgical History:  Procedure Laterality Date   RECTAL EXAM UNDER ANESTHESIA N/A 11/22/2020   Procedure: ANAL  EXAM UNDER ANESTHESIA;  Surgeon: Romie Levee, MD;  Location: [facility] [place];  Service: General;  Laterality: N/A;   SPHINCTEROTOMY N/A 11/22/2020   Procedure: CHEMICAL SPHINCTEROTOMY/ FISSURECTOMY;  Surgeon: Romie Levee, MD;  Location: [facility];  Service: General;  Laterality: N/A;   TONSILLECTOMY AND ADENOIDECTOMY  2010   WISDOM TOOTH EXTRACTION  2020    Current Outpatient Medications  Medication Sig Dispense Refill   albuterol (PROVENTIL HFA;VENTOLIN HFA) 108 (90 Base) MCG/ACT inhaler Inhale 2 puffs into the lungs every 6 (six) hours as needed for wheezing or shortness of breath. 1 Inhaler 3   Atogepant (QULIPTA) 60 MG TABS Take 60 mg by mouth daily. 30 tablet 5   bisoprolol (ZEBETA) 5 MG tablet TAKE 0.5 TABLET BY MOUTH DAILY 45 tablet 3   buPROPion (WELLBUTRIN XL) 300 MG 24 hr tablet Take 1 tablet (300 mg total) by mouth daily. 90 tablet 1   calcium carbonate (TUMS - DOSED IN MG ELEMENTAL CALCIUM) 500 MG chewable tablet Chew 1 tablet by mouth as needed for indigestion or heartburn.  DULoxetine (CYMBALTA) 60 MG capsule TAKE 1 CAPSULE BY MOUTH EVERY DAY 90 capsule 1   EPINEPHrine 0.3 mg/0.3 mL IJ SOAJ injection Inject 0.3 mg into the muscle daily as needed for anaphylaxis.      montelukast (SINGULAIR) 10 MG tablet Take 1 tablet (10 mg total) by mouth daily. 90 tablet 3   Multiple Vitamin (MULTIVITAMIN) tablet Take 1 tablet by mouth daily.     omeprazole (PRILOSEC) 20 MG capsule Take 1 capsule (20 mg total) by mouth daily. 90 capsule 1   promethazine (PHENERGAN) 12.5 MG tablet Take 1 tablet (12.5 mg total) by mouth every 6 (six) hours as needed for nausea or vomiting. 120 tablet 5   SUMAtriptan (IMITREX) 50 MG tablet Take 1 tablet (50 mg total) by  mouth every 2 (two) hours as needed for migraine. May repeat in 2 hours if headache persists or recurs. 10 tablet 5   tiZANidine (ZANAFLEX) 4 MG tablet TAKE 0.5-1 TABLETS (2-4 MG TOTAL) BY MOUTH 2 (TWO) TIMES DAILY AS NEEDED FOR MUSCLE SPASMS. 180 tablet 1   atenolol (TENORMIN) 25 MG tablet Take 0.5 tablets (12.5 mg total) by mouth daily. (Patient not taking: Reported on 05/03/2021) 15 tablet 0   metoprolol tartrate (LOPRESSOR) 25 MG tablet TAKE 0.5 TABLET (12.5 MG) BY MOUTH 2 TIMES DAILY. (Patient not taking: Reported on 05/03/2021) 30 tablet 3   norethindrone-ethinyl estradiol (JUNEL 1/20) 1-20 MG-MCG tablet TAKE 1 TABLET BY MOUTH DAILY. TAKE CONTINUOUSLY, SKIP PLACEBO WEEK 112 tablet 4   Current Facility-Administered Medications  Medication Dose Route Frequency Provider Last Rate Last Admin   0.9 %  sodium chloride infusion  500 mL Intravenous Once Armbruster, Willaim Rayas, MD        Family History  Problem Relation Age of Onset   Arthritis Mother    Asthma Mother    Miscarriages / [place] Mother    Migraines Mother    COPD Father        per pt's mother-mild   Hypertension Father    Migraines Father    Asthma Brother    Migraines Brother    Anxiety disorder Brother    Arthritis Maternal Grandmother    Cancer Maternal Grandmother    Miscarriages / Stillbirths Maternal Grandmother    Diabetes Maternal Grandfather    Heart attack Maternal Grandfather    Heart disease Maternal Grandfather    Hyperlipidemia Maternal Grandfather    Hypertension Maternal Grandfather    Stroke Maternal Grandfather    Colon polyps Maternal Grandfather    Cancer Paternal Grandmother    Alcohol abuse Paternal Grandfather    Cancer Paternal Grandfather    COPD Paternal Grandfather    Heart attack Paternal Grandfather    Heart disease Paternal Grandfather    Hyperlipidemia Paternal Grandfather    Hypertension Paternal Grandfather    Asthma Brother    OCD Brother    Anxiety disorder Brother    Seizures  Neg Hx    Depression Neg Hx    Bipolar disorder Neg Hx    Schizophrenia Neg Hx    ADD / ADHD Neg Hx    Autism Neg Hx    Colon cancer Neg Hx    Esophageal cancer Neg Hx    Rectal cancer Neg Hx    Stomach cancer Neg Hx     Review of Systems  All other systems reviewed and are negative.  Exam:   BP 116/76 (BP Location: Right Arm, Patient Position: Sitting, Cuff Size: Normal)   Pulse 85  Ht 5\' 7"  (1.702 m)   Wt 152 lb (68.9 kg)   SpO2 99%   BMI 23.81 kg/m   Weight change: @WEIGHTCHANGE @ Height:   Height: 5\' 7"  (170.2 cm)  Ht Readings from Last 3 Encounters:  05/03/21 5\' 7"  (1.702 m) (86 %, Z= 1.07)*  04/03/21 5\' 7"  (1.702 m) (86 %, Z= 1.07)*  03/14/21 5\' 7"  (1.702 m) (86 %, Z= 1.07)*   * Growth percentiles are based on CDC (Girls, 2-20 Years) data.    General appearance: alert, cooperative and appears stated age Head: Normocephalic, without obvious abnormality, atraumatic Neck: no adenopathy, supple, symmetrical, trachea midline and thyroid normal to inspection and palpation Lungs: clear to auscultation bilaterally Cardiovascular: regular rate and rhythm Breasts: normal appearance, no masses or tenderness Abdomen: soft, non-tender; non distended,  no masses,  no organomegaly Extremities: extremities normal, atraumatic, no cyanosis or edema Skin: Skin color, texture, turgor normal. No rashes or lesions Lymph nodes: Cervical, supraclavicular, and axillary nodes normal. No abnormal inguinal nodes palpated Neurologic: Grossly normal Pelvic: deferred Patient self collected vaginal swab  1. Well woman exam Normal exam Discussed breast self exam Discussed calcium and vit D intake Labs with primary No pap until she is 22  2. Screening examination for STD (sexually transmitted disease) Declines blood work - SURESWAB CT/NG/T. vaginalis  3. Encounter for surveillance of contraceptive pills Continue to take the pill continuously - norethindrone-ethinyl estradiol (JUNEL  1/20) 1-20 MG-MCG tablet; TAKE 1 TABLET BY MOUTH DAILY. TAKE CONTINUOUSLY, SKIP PLACEBO WEEK  Dispense: 112 tablet; Refill: 4

## 2021-05-06 LAB — SURESWAB CT/NG/T. VAGINALIS
C. trachomatis RNA, TMA: NOT DETECTED
N. gonorrhoeae RNA, TMA: NOT DETECTED
Trichomonas vaginalis RNA: NOT DETECTED

## 2021-05-07 NOTE — Progress Notes (Signed)
NEUROLOGY FOLLOW UP OFFICE NOTE  Kiara Brewer [id number]  Assessment/Plan:   Chronic migraine Recurrent syncope/orthostatic hypotension/tachycardia - dysautonomia - currently followed by cardiology. Complex regional pain syndrome  Migraine prevention:  Will start topiramate 95m at bedtime.  We can increase to 565mat bedtime if needed.  Side effects discussed (cognitive changes, paresthesias, weight loss, teratogenic effects so take precautions not to get pregnant).  She is already on antidepressants and beta blocker.  Migraine rescue:  she will retry sumatriptan knowing that she may repeat dose after 2 hours.  If ineffective, then would try rizatriptan. Promethazine for nausea. Limit use of pain relievers to no more than 2 days out of week to prevent risk of rebound or medication-overuse headache. Keep headache diary Follow up 6 months.   Subjective:  Kiara Brewer an [age]ear old right-handed female with fibromyalgia and migraine who follows up for migraine    UPDATE: MRI of brain with and without contrast on 01/18/2021 personally reviewed was normal.  Routine EEG on 01/10/2021 was normal.  48 hour ambulatory EEG in April was normal.  Captured 3 habitual events without electrographic correlate.    Started QuLenoria Chimen March.  She reports worsening headaches right after starting it, so she stopped after a few weeks. Intensity:  7/10 Duration:  several hours to all day Frequency:  daily Frequency of abortive medication: sumatriptan one day a week Current NSAIDS/analgesics:  none Current triptans:  Sumatriptan 503murrent anti-emetic:  Phenergan 12.5mg26mrrent muscle relaxants:  Tizanidine 2-4mg 66m PRN Current Antihypertensive medications:  bisoprolol Current Antidepressant medications:  Cymbalta 60mg 61my, Wellbutrin XL 300mg d57m Current Anticonvulsant medications:  Keppra 1000mg BI28mor neuralgia) Current CGRP inhibitor:  none  Caffeine:  Celcius energy  drink Diet: Drinks a lot of water and Gatorade.  Snacks throughout day rather than large meals.   Exercise:  Lift weights 6 times a week. . Sometimes running Depression:  yes; Anxiety:  yes Sleep hygiene:  Falls asleep but trouble staying asleep.  HISTORY:  Patient has complex medical history.   When she was in middle school, she fell on the ice landing on her right leg.  It didn't seem too bad but over the next several days, she developed severe pain.  She started experiencing discoloration.  [facility] with complex regional pain syndrome.  In 2019, she began having numbness and tingling in her feet, mostly the right foot.  She would intermittently have a drop foot, causing her leg to give out.  She had a NCV-EMG of the right lower extremity in December 2019 but she was unable to tolerate it and it was abruptly stopped.  NCV showed prolonged distal motor latency and low motor amplitude of the right peroneal nerve with slowing above and below the fibular head.  Right sural and superficial peroneal nerves were normal.  On needle exam, the right tibialis anterior muscle was normal but she then refused to continue the study.  Lab work included negative workup for autoimmune disease - including ANA, sed rate, CRP, HLA-B27.  B12, folate, TSH, CK and aldolase have been normal.  She is under the care of pain management.   She has had headaches off and on since childhood but she started having more severe and frequent migraines in 2020.  She describes a severe stabbing and pounding pain behind the ears or in the eyes, either side.  There is associated nausea, vomiting, photophobia, phonophobia and  osmophobia.  If she is able to take sumatriptan within 15 minutes of onset, then it resolves within an other, otherwise it lasts all day.  They occur 2 to 3 days a week.  She also has a daily dull headache for which she takes Excedrin daily.     Also  since 2020, she has had recurrent episodes of passing out.  She describes feeling hot and lightheaded with heaviness in her legs and then passes out.  It lasts only a few seconds.  No convulsions, incontinence, tongue/cheek biting.  No postictal confusion.  It occurs 2-3 times a day..  Sometimes preceded briefly by sharp severe headache.  It mostly occurs with change in position, such as bending over, but some are spontaneous.  She has been diagnosed with orthostatic hypotension.  She feels lightheaded when she stands up from sitting.  Unclear if related to polypharmacy.  She has been referred cardiology.  TTE unremarkable with normal EF and no valvular abnormalities.  She was ordered a 14 day ZIO patch but only tolerated 4 days due to skin irritation.  Heart rate demonstrated 43-173bpm with average 93 bpm.  Rare PVCs and PACs.  Events corresponded to sinus rhythm or sinus tachycardia but no SVT.  Cardiology does not suspect POTS, Marfan's or Ehlers Danlos Syndrome.  Due to hitting her head from a fall, she had a CT of head of head without contrast on 08/29/2020 personally reviewed which was slightly limited by streak artifact from metal portions of hair extension but appeared normal.      Past NSAIDS/analgesics:  Norco, Excedrin  Past abortive triptans:  none Past muscle relaxants:  baclofen Past anti-emetic:  Zofran Past antihypertensive medications:  atenolol, metoprolol Past antidepressant medications:  amitriptyline Past anticonvulsant medications:  Gabapentin, Lyrica Past CGRP inhibitor:  Qulipta (made headaches worse) Other past therapies:  none    Family history of headache:  Father (migraines), mother (occasional headaches)  PAST MEDICAL HISTORY: Past Medical History:  Diagnosis Date   Anal pain    Anxiety    Complex regional pain syndrome of both lower extremities    per pt w/ right thigh numbness   Depression    Exercise-induced asthma    Fainting episodes    Fibromyalgia    GERD  (gastroesophageal reflux disease)    History of concussion 04/2019   per pt no residual   History of Osgood-Schlatter disease    History of syncope    ED visit 08-29-2020 w/ cp, palpitations--- pt sent for cardiology evaluation , dr Quentin Ore, office note 09-07-2020 dx orthostatic intolerence ;   echo in epic 09-14-2020 , normal;  event monitor 09-13-2020 no signfiance arrhythmia , SR/ST;  CT angio chest 09-02-2020  normal   Irritable bowel    Migraine without aura    Orthostatic hypotension    Sacral radiculopathy    Spondylolisthesis, lumbosacral region    L5--S1   Wears contact lenses     MEDICATIONS: Current Outpatient Medications on File Prior to Visit  Medication Sig Dispense Refill   albuterol (PROVENTIL HFA;VENTOLIN HFA) 108 (90 Base) MCG/ACT inhaler Inhale 2 puffs into the lungs every 6 (six) hours as needed for wheezing or shortness of breath. 1 Inhaler 3   atenolol (TENORMIN) 25 MG tablet Take 0.5 tablets (12.5 mg total) by mouth daily. (Patient not taking: Reported on 05/03/2021) 15 tablet 0   Atogepant (QULIPTA) 60 MG TABS Take 60 mg by mouth daily. 30 tablet 5   bisoprolol (ZEBETA) 5 MG tablet TAKE  0.5 TABLET BY MOUTH DAILY 45 tablet 3   buPROPion (WELLBUTRIN XL) 300 MG 24 hr tablet Take 1 tablet (300 mg total) by mouth daily. 90 tablet 1   calcium carbonate (TUMS - DOSED IN MG ELEMENTAL CALCIUM) 500 MG chewable tablet Chew 1 tablet by mouth as needed for indigestion or heartburn.     DULoxetine (CYMBALTA) 60 MG capsule TAKE 1 CAPSULE BY MOUTH EVERY DAY 90 capsule 1   EPINEPHrine 0.3 mg/0.3 mL IJ SOAJ injection Inject 0.3 mg into the muscle daily as needed for anaphylaxis.      metoprolol tartrate (LOPRESSOR) 25 MG tablet TAKE 0.5 TABLET (12.5 MG) BY MOUTH 2 TIMES DAILY. (Patient not taking: Reported on 05/03/2021) 30 tablet 3   montelukast (SINGULAIR) 10 MG tablet Take 1 tablet (10 mg total) by mouth daily. 90 tablet 3   Multiple Vitamin (MULTIVITAMIN) tablet Take 1 tablet by  mouth daily.     norethindrone-ethinyl estradiol (JUNEL 1/20) 1-20 MG-MCG tablet TAKE 1 TABLET BY MOUTH DAILY. TAKE CONTINUOUSLY, SKIP PLACEBO WEEK 112 tablet 4   omeprazole (PRILOSEC) 20 MG capsule Take 1 capsule (20 mg total) by mouth daily. 90 capsule 1   promethazine (PHENERGAN) 12.5 MG tablet Take 1 tablet (12.5 mg total) by mouth every 6 (six) hours as needed for nausea or vomiting. 120 tablet 5   SUMAtriptan (IMITREX) 50 MG tablet Take 1 tablet (50 mg total) by mouth every 2 (two) hours as needed for migraine. May repeat in 2 hours if headache persists or recurs. 10 tablet 5   tiZANidine (ZANAFLEX) 4 MG tablet TAKE 0.5-1 TABLETS (2-4 MG TOTAL) BY MOUTH 2 (TWO) TIMES DAILY AS NEEDED FOR MUSCLE SPASMS. 180 tablet 1   Current Facility-Administered Medications on File Prior to Visit  Medication Dose Route Frequency Provider Last Rate Last Admin   0.9 %  sodium chloride infusion  500 mL Intravenous Once Armbruster, Carlota Raspberry, MD        ALLERGIES: Allergies  Allergen Reactions   Pecan Extract Allergy Skin Test Anaphylaxis   Strawberry Extract Anaphylaxis   Other Hives    Red Meat    Gluten Meal    Shellfish Allergy     lobster    FAMILY HISTORY: Family History  Problem Relation Age of Onset   Arthritis Mother    Asthma Mother    Miscarriages / [place] Mother    Migraines Mother    COPD Father        per pt's mother-mild   Hypertension Father    Migraines Father    Asthma Brother    Migraines Brother    Anxiety disorder Brother    Arthritis Maternal Grandmother    Cancer Maternal Grandmother    Miscarriages / Stillbirths Maternal Grandmother    Diabetes Maternal Grandfather    Heart attack Maternal Grandfather    Heart disease Maternal Grandfather    Hyperlipidemia Maternal Grandfather    Hypertension Maternal Grandfather    Stroke Maternal Grandfather    Colon polyps Maternal Grandfather    Cancer Paternal Grandmother    Alcohol abuse Paternal Grandfather     Cancer Paternal Grandfather    COPD Paternal Grandfather    Heart attack Paternal Grandfather    Heart disease Paternal Grandfather    Hyperlipidemia Paternal Grandfather    Hypertension Paternal Grandfather    Asthma Brother    OCD Brother    Anxiety disorder Brother    Seizures Neg Hx    Depression Neg Hx  Bipolar disorder Neg Hx    Schizophrenia Neg Hx    ADD / ADHD Neg Hx    Autism Neg Hx    Colon cancer Neg Hx    Esophageal cancer Neg Hx    Rectal cancer Neg Hx    Stomach cancer Neg Hx       Objective:  Blood pressure 97/61, pulse 90, height '5\' 7"'  (1.702 m), weight 150 lb (68 kg), SpO2 96 %. General: No acute distress.  Patient appears well-groomed.     Metta Clines, DO  CC: Orma Flaming, MD

## 2021-05-08 ENCOUNTER — Encounter: Payer: Self-pay | Admitting: Neurology

## 2021-05-08 ENCOUNTER — Ambulatory Visit (INDEPENDENT_AMBULATORY_CARE_PROVIDER_SITE_OTHER): Payer: Self-pay | Admitting: Neurology

## 2021-05-08 ENCOUNTER — Other Ambulatory Visit: Payer: Self-pay

## 2021-05-08 VITALS — BP 97/61 | HR 90 | Ht 67.0 in | Wt 150.0 lb

## 2021-05-08 DIAGNOSIS — G43709 Chronic migraine without aura, not intractable, without status migrainosus: Secondary | ICD-10-CM

## 2021-05-08 MED ORDER — TOPIRAMATE 25 MG PO TABS
25.0000 mg | ORAL_TABLET | Freq: Every day | ORAL | 5 refills | Status: DC
Start: 2021-05-08 — End: 2021-10-02

## 2021-05-08 NOTE — Patient Instructions (Signed)
  Start topiramate 25mg  at bedtime.  Contact in 4 weeks with update and we can increase dose if needed. Take sumatriptan at earliest onset of headache.  May repeat dose once in 2 hours if needed.  Maximum 2 tablets in 24 hours. Limit use of pain relievers to no more than 2 days out of the week.  These medications include acetaminophen, NSAIDs (ibuprofen/Advil/Motrin, naproxen/Aleve, triptans (Imitrex/sumatriptan), Excedrin, and narcotics.  This will help reduce risk of rebound headaches. Be aware of common food triggers:  - Caffeine:  coffee, black tea, cola, Mt. Dew  - Chocolate  - Dairy:  aged cheeses (brie, blue, cheddar, gouda, Palmer, provolone, [place], Swiss, etc), chocolate milk, buttermilk, sour cream, limit eggs and yogurt  - Nuts, peanut butter  - Alcohol  - Cereals/grains:  FRESH breads (fresh bagels, sourdough, doughnuts), yeast productions  - Processed/canned/aged/cured meats (pre-packaged deli meats, hotdogs)  - MSG/glutamate:  soy sauce, flavor enhancer, pickled/preserved/marinated foods  - Sweeteners:  aspartame (Equal, Nutrasweet).  Sugar and Splenda are okay  - Vegetables:  legumes (lima beans, lentils, snow peas, fava beans, pinto peans, peas, garbanzo beans), sauerkraut, onions, olives, pickles  - Fruit:  avocados, bananas, citrus fruit (orange, lemon, grapefruit), mango  - Other:  Frozen meals, macaroni and cheese Routine exercise Stay adequately hydrated (aim for 64 oz water daily) Keep headache diary Maintain proper stress management Maintain proper sleep hygiene Do not skip meals Consider supplements:  magnesium citrate 400mg  daily, riboflavin 400mg  daily, coenzyme Q10 100mg  three times daily.

## 2021-05-15 ENCOUNTER — Other Ambulatory Visit: Payer: Self-pay | Admitting: Physical Medicine and Rehabilitation

## 2021-05-23 ENCOUNTER — Ambulatory Visit: Payer: No Typology Code available for payment source | Admitting: Gastroenterology

## 2021-05-23 NOTE — Progress Notes (Deleted)
HPI :  Kiara Brewer  is an 20 year old female with a past medical history of anxiety, depression, asthma, fibromyalgia, complex regional pain syndrome of the right lower extremity, migraine headaches, fibromyalgia and an anal fissure.  Past T & A surgery and wisdom teeth extraction surgery.   I initially saw the patient in the office on 08/27/2020 for further evaluation regarding IBS symptoms and an anal fissure.  At that time, I prescribed diltiazem/lidocaine fissure ointment, fiber and MiraLAX.  Eventual colonoscopy after cardiology evaluation for syncopal episodes completed.  She presents today accompanied by her mother for further follow-up.   She was seen in the ED 11/3/201 with malaise, palpitations, CP and SOB. D-dimer elevated.  Chest CT angiogram was negative for PE.  Covid 19 negative.    She was evaluated by cardiologist Dr. Lalla Brothers on 09/07/2020 due to having a syncopal episodes and chest pain, palpitations and shortness of breath as noted above.  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  Dr. Lalla Brothers was concerned her polypharmacy was mot likely contributing to her orthostatic intolerance as she is on Codeine, Wellbutrin, Cymbalta, Neurontin, Lyrica and Zanaflex.  He was advised to follow-up with psychiatry sort through his medications.  She was advised to stay hydrated and to avoid rapid changes in position.  No further cardiac evaluation was recommended.   She complains of having nausea for the past month.  She has nausea with vomiting when she has a migraine headache which occurs once or twice weekly.  She last vomited secretions with a bit of partially digested food 2 nights ago that hematemesis.  However, she reported going to [facility] in [facility] 08/08/2020 due to having nausea/vomiting and diarrhea x1 day.  LFTs and lipase were normal. She reported having black emesis at that time.  An EGD was not  done.  She was diagnosed with a viral illness and she was prescribe Zofran then discharged home.  She complains of having heartburn several days weekly for which she takes Tums with relief.  She complains of having dysphagia, feels as if her esophagus gets tight and she cannot swallow which occurs 3 times weekly for the past few years.  She denies having food gets stuck in the throat or esophagus.  She has variable intermittent upper and lower abdominal pain.  No abdominal pain at this time.  She continues to to have pain and bleeding from her anal fissure.  She reports being compliant with taking diltiazem/lidocaine fissure ointment 3 times daily since her last office visit.  He is passing a fairly soft formed bowel movement daily without straining.  She is seeing a small amount of bright red blood on the toilet tissue and on the stool.  She is no longer taking ibuprofen.  .  20 year old female with GERD, dysphagia, nausea x 1 month and questionable hematemesis (x 1 episode 08/08/2020). -EGD benefits and risks discussed including risk with sedation, risk of bleeding, perforation and infection  -Famotidine 20 mg daily -Consider abdominal sonogram if EGD negative   2.  Anal fissure, rectal bleeding -Colonoscopy benefits and risks discussed including risk with sedation, risk of bleeding, perforation and infection  -Patient to hydrate well prior to colonoscopy date, avoid dehydration discussed thoroughly.  Patient to follow colonoscopy prep handout instructions. -Continue diltiazem/lidocaine fissure cream 3 times daily as previously prescribed.  May add Desitin apply small amount inside the anal opening into the external anal area 3  times daily.  NTG ointment not prescribe this medication may worsen her orthostatic intolerance and migraine headaches. -Refer to general surgery for further fissure evaluation/treatment   3.  Orthostatic intolerance. No further cardiac evaluation required per Dr. Lalla BrothersLambert.    4. Anxiety and depression -Agree with psychiatrist evaluation for polypharmacy management   EGD 01/29/21 - Esophagogastric landmarks were identified: the Z-line was found at 37 cm, the gastroesophageal junction was found at 37 cm and the upper extent of the gastric folds was found at 37 cm from the incisors. Findings: - LA Grade A esophagitis was found 37 cm from the incisors. - The exam of the esophagus was otherwise normal. No obvious stenosis / stricture appreciated. - A guidewire was placed and the scope was withdrawn. Empiric dilation was performed in the entire esophagus with a Savary dilator with mild resistance at 17 mm. Relook endoscopy showed no mucosal wrents. Biopsies were taken with a cold forceps in the upper third of the esophagus, in the middle third of the esophagus and in the lower third of the esophagus for histology. - The entire examined stomach was normal. Biopsies were taken with a cold forceps for Helicobacter pylori testing. - The duodenal bulb and second portion of the duodenum were normal. Biopsies for histology were taken with a cold forceps for evaluation of celiac disease.  Colonoscopy 01/29/21 - An anal fissure was found on perianal exam in the anterior midline location. - The terminal ileum appeared normal. - Internal hemorrhoids were found during retroflexion. The hemorrhoids were small. - Anal papilla(e) were hypertrophied. - The exam was otherwise without abnormality. No overt inflammatory changes. The prep was adequate but several minutes taken to lavage the colon to achieve adequate views. - Biopsies for histology were taken with a cold forceps from the right colon, left colon and transverse colon for evaluation of microscopic colitis.  Diagnosis 1. Surgical [P], small bowel bx's - PEPTIC DUODENITIS - NO DYSPLASIA OR MALIGNANCY IDENTIFIED 2. Surgical [P], gastric antrum and gastric body - REACTIVE GASTROPATHY - NO H. PYLORI OR INTESTINAL  METAPLASIA IDENTIFIED - SEE COMMENT 3. Surgical [P], esophageal - REACTIVE SQUAMOUS MUCOSA - NO INCREASED INTRAEPITHELIAL EOSINOPHILS 4. Surgical [P], colon nos, random sites - BENIGN COLONIC MUCOSA - NO ACTIVE INFLAMMATION OR EVIDENCE OF MICROSCOPIC COLITIS - NO HIGH GRADE DYSPLASIA OR MALIGNANCY IDENTIFIED    Trial of omeprazole Switched metamucil to citrucel Tried diltiazem ointment Past Medical History:  Diagnosis Date   Anal pain    Anxiety    Complex regional pain syndrome of both lower extremities    per pt w/ right thigh numbness   Depression    Exercise-induced asthma    Fainting episodes    Fibromyalgia    GERD (gastroesophageal reflux disease)    History of concussion 04/2019   per pt no residual   History of Osgood-Schlatter disease    History of syncope    ED visit 08-29-2020 w/ cp, palpitations--- pt sent for cardiology evaluation , dr Lalla Brotherslambert, office note 09-07-2020 dx orthostatic intolerence ;   echo in epic 09-14-2020 , normal;  event monitor 09-13-2020 no signfiance arrhythmia , SR/ST;  CT angio chest 09-02-2020  normal   Irritable bowel    Migraine without aura    Orthostatic hypotension    Sacral radiculopathy    Spondylolisthesis, lumbosacral region    L5--S1   Wears contact lenses      Past Surgical History:  Procedure Laterality Date   RECTAL EXAM UNDER ANESTHESIA N/A 11/22/2020  Procedure: ANAL  EXAM UNDER ANESTHESIA;  Surgeon: Romie Levee, MD;  Location: [facility];  Service: General;  Laterality: N/A;   SPHINCTEROTOMY N/A 11/22/2020   Procedure: CHEMICAL SPHINCTEROTOMY/ FISSURECTOMY;  Surgeon: Romie Levee, MD;  Location: [facility] ;  Service: General;  Laterality: N/A;   TONSILLECTOMY AND ADENOIDECTOMY  2010   WISDOM TOOTH EXTRACTION  2020   Family History  Problem Relation Age of Onset   Arthritis Mother    Asthma Mother    Miscarriages / [place] Mother    Migraines Mother    COPD Father         per pt's mother-mild   Hypertension Father    Migraines Father    Asthma Brother    Migraines Brother    Anxiety disorder Brother    Arthritis Maternal Grandmother    Cancer Maternal Grandmother    Miscarriages / Stillbirths Maternal Grandmother    Diabetes Maternal Grandfather    Heart attack Maternal Grandfather    Heart disease Maternal Grandfather    Hyperlipidemia Maternal Grandfather    Hypertension Maternal Grandfather    Stroke Maternal Grandfather    Colon polyps Maternal Grandfather    Cancer Paternal Grandmother    Alcohol abuse Paternal Grandfather    Cancer Paternal Grandfather    COPD Paternal Grandfather    Heart attack Paternal Grandfather    Heart disease Paternal Grandfather    Hyperlipidemia Paternal Grandfather    Hypertension Paternal Grandfather    Asthma Brother    OCD Brother    Anxiety disorder Brother    Seizures Neg Hx    Depression Neg Hx    Bipolar disorder Neg Hx    Schizophrenia Neg Hx    ADD / ADHD Neg Hx    Autism Neg Hx    Colon cancer Neg Hx    Esophageal cancer Neg Hx    Rectal cancer Neg Hx    Stomach cancer Neg Hx    Social History   Tobacco Use   Smoking status: Never   Smokeless tobacco: Never  Vaping Use   Vaping Use: Never used  Substance Use Topics   Alcohol use: Never   Drug use: Never   Current Outpatient Medications  Medication Sig Dispense Refill   albuterol (PROVENTIL HFA;VENTOLIN HFA) 108 (90 Base) MCG/ACT inhaler Inhale 2 puffs into the lungs every 6 (six) hours as needed for wheezing or shortness of breath. 1 Inhaler 3   atenolol (TENORMIN) 25 MG tablet Take 0.5 tablets (12.5 mg total) by mouth daily. (Patient not taking: No sig reported) 15 tablet 0   Atogepant (QULIPTA) 60 MG TABS Take 60 mg by mouth daily. (Patient not taking: Reported on 05/08/2021) 30 tablet 5   bisoprolol (ZEBETA) 5 MG tablet TAKE 0.5 TABLET BY MOUTH DAILY 45 tablet 3   buPROPion (WELLBUTRIN XL) 300 MG 24 hr tablet Take 1 tablet (300 mg  total) by mouth daily. 90 tablet 1   calcium carbonate (TUMS - DOSED IN MG ELEMENTAL CALCIUM) 500 MG chewable tablet Chew 1 tablet by mouth as needed for indigestion or heartburn.     DULoxetine (CYMBALTA) 60 MG capsule TAKE 1 CAPSULE BY MOUTH EVERY DAY 90 capsule 1   EPINEPHrine 0.3 mg/0.3 mL IJ SOAJ injection Inject 0.3 mg into the muscle daily as needed for anaphylaxis.  (Patient not taking: Reported on 05/08/2021)     metoprolol tartrate (LOPRESSOR) 25 MG tablet TAKE 0.5 TABLET (12.5 MG) BY MOUTH 2 TIMES DAILY. (Patient not  taking: No sig reported) 30 tablet 3   montelukast (SINGULAIR) 10 MG tablet Take 1 tablet (10 mg total) by mouth daily. 90 tablet 3   Multiple Vitamin (MULTIVITAMIN) tablet Take 1 tablet by mouth daily.     norethindrone-ethinyl estradiol (JUNEL 1/20) 1-20 MG-MCG tablet TAKE 1 TABLET BY MOUTH DAILY. TAKE CONTINUOUSLY, SKIP PLACEBO WEEK 112 tablet 4   omeprazole (PRILOSEC) 20 MG capsule Take 1 capsule (20 mg total) by mouth daily. 90 capsule 1   promethazine (PHENERGAN) 12.5 MG tablet Take 1 tablet (12.5 mg total) by mouth every 6 (six) hours as needed for nausea or vomiting. 120 tablet 5   SUMAtriptan (IMITREX) 50 MG tablet Take 1 tablet (50 mg total) by mouth every 2 (two) hours as needed for migraine. May repeat in 2 hours if headache persists or recurs. 10 tablet 5   tiZANidine (ZANAFLEX) 4 MG tablet TAKE 0.5-1 TABLETS (2-4 MG TOTAL) BY MOUTH 2 (TWO) TIMES DAILY AS NEEDED FOR MUSCLE SPASMS. 180 tablet 1   topiramate (TOPAMAX) 25 MG tablet Take 1 tablet (25 mg total) by mouth at bedtime. 30 tablet 5   Current Facility-Administered Medications  Medication Dose Route Frequency Provider Last Rate Last Admin   0.9 %  sodium chloride infusion  500 mL Intravenous Once Kurt Azimi, Willaim Rayas, MD       Allergies  Allergen Reactions   Pecan Extract Allergy Skin Test Anaphylaxis   Strawberry Extract Anaphylaxis   Other Hives    Red Meat    Gluten Meal    Shellfish Allergy      lobster     Review of Systems: All systems reviewed and negative except where noted in HPI.    No results found.  Physical Exam: There were no vitals taken for this visit. Constitutional: Pleasant,well-developed, ***female in no acute distress. HEENT: Normocephalic and atraumatic. Conjunctivae are normal. No scleral icterus. Neck supple.  Cardiovascular: Normal rate, regular rhythm.  Pulmonary/chest: Effort normal and breath sounds normal. No wheezing, rales or rhonchi. Abdominal: Soft, nondistended, nontender. Bowel sounds active throughout. There are no masses palpable. No hepatomegaly. Extremities: no edema Lymphadenopathy: No cervical adenopathy noted. Neurological: Alert and oriented to person place and time. Skin: Skin is warm and dry. No rashes noted. Psychiatric: Normal mood and affect. Behavior is normal.   ASSESSMENT AND PLAN:  Orland Mustard, MD

## 2021-06-03 DIAGNOSIS — G901 Familial dysautonomia [Riley-Day]: Secondary | ICD-10-CM | POA: Insufficient documentation

## 2021-06-03 DIAGNOSIS — I951 Orthostatic hypotension: Secondary | ICD-10-CM | POA: Insufficient documentation

## 2021-06-06 ENCOUNTER — Other Ambulatory Visit: Payer: Self-pay | Admitting: Family Medicine

## 2021-06-07 ENCOUNTER — Other Ambulatory Visit: Payer: Self-pay

## 2021-06-07 ENCOUNTER — Encounter: Payer: Self-pay | Admitting: Physical Medicine and Rehabilitation

## 2021-06-07 ENCOUNTER — Encounter: Payer: 59 | Attending: Physical Medicine and Rehabilitation | Admitting: Physical Medicine and Rehabilitation

## 2021-06-07 VITALS — BP 110/66 | HR 63 | Temp 98.7°F | Ht 67.0 in | Wt 151.0 lb

## 2021-06-07 DIAGNOSIS — M7918 Myalgia, other site: Secondary | ICD-10-CM | POA: Diagnosis not present

## 2021-06-07 DIAGNOSIS — G43019 Migraine without aura, intractable, without status migrainosus: Secondary | ICD-10-CM | POA: Diagnosis not present

## 2021-06-07 DIAGNOSIS — M797 Fibromyalgia: Secondary | ICD-10-CM

## 2021-06-07 MED ORDER — SUMATRIPTAN SUCCINATE 50 MG PO TABS: 50.0000 mg | ORAL_TABLET | ORAL | 5 refills | Status: DC | PRN

## 2021-06-07 MED ORDER — PROMETHAZINE HCL 12.5 MG PO TABS: 12.5000 mg | ORAL_TABLET | Freq: Four times a day (QID) | ORAL | 5 refills | Status: DC | PRN

## 2021-06-07 NOTE — Patient Instructions (Signed)
Plan: Don't have samples for Imitrex- insurance won't transition medication to another [facility] for her- so will send new Rx- however, since has had migraine for 3 days and having trouble getting her meds, will give her samples of Nurtec- 75 mg #4- educated on Nurtec- amples given.  Sent Imitrex to [facility] in [place], [place]- #10 with 5 refills.  Will also send in Phenergan 12.5 mg- can tak eup to 25 mg at a time for nausea- sent in #90 with 5 refills. For nausea related to migraines.   4. Patient here for trigger point injections for  Consent done and on chart.  Cleaned areas with alcohol and injected using a 27 gauge 1.5 inch needle  Injected  1cc Using 1% Lidocaine with no EPI  Upper traps B/L- with 1 stick on levators- aimed to upper traps.  Levators B/L  Posterior scalenes Middle scalenes Splenius Capitus Pectoralis Major Rhomboids Infraspinatus Teres Major/minor Thoracic paraspinals Lumbar paraspinals Other injections-    Patient's level of pain prior was 7/10 Current level of pain after injections is 5/10  There was no bleeding or complications.  Patient was advised to drink a lot of water on day after injections to flush system Will have increased soreness for 12-48 hours after injections.  Can use Lidocaine patches the day AFTER injections Can use theracane on day of injections in places didn't inject Can use heating pad 4-6 hours AFTER injections  5. Discussed using theracane when gets home- focus on side of neck on each side- posterior scalenes.  Hold pressure 2+ minutes- no massage.    6. F/U in 2-3 months

## 2021-06-07 NOTE — Progress Notes (Signed)
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 Also has chronic pain syndrome- could also be due to fibromyalgia/Myofascial pain syndrome   Here for f/u on myofascial pain, hypermobility syndrome and fibromyalgia.   Having a lot of migraines-  Has been having current migraine for 3 days straight-  Thinks it could be weather related.   Kiara Brew[place] for [employer] and went to [place] [employer].   Went to urgent care yesterday- got 2 medications- "a muscle relaxer for stomach".  Also having stomach cramping- also nausea.  Hurting behind both eyes.  Like stabbing pain with associated nausea- having light >sound sensitivity.    Exam: Awake, alert, quieter than normal- due to HA- turned light of due to light sensitivity-  tight in levators as well as scalenes B/L    Plan: Don't have samples for Imitrex- insurance won't transition medication to another [facility] for [facility]- so will send new Rx- however, since has had migraine for 3 days and having trouble getting her meds, will give her samples of Nurtec- 75 mg #4- educated on Nurtec- amples given.  Sent Imitrex to [facility] in [place], [place]- #10 with 5 refills.  Will also send in Phenergan 12.5 mg- can tak eup to 25 mg at a time for nausea- sent in #90 with 5 refills. For nausea related to migraines.   4. Patient here for trigger point injections for  Consent done and on chart.  Cleaned areas with alcohol and injected using a 27 gauge 1.5 inch needle  Injected  1cc Using 1% Lidocaine with no EPI  Upper traps B/L- with 1 stick on levators- aimed to upper traps.  Levators B/L  Posterior scalenes Middle scalenes Splenius Capitus Pectoralis Major Rhomboids Infraspinatus Teres Major/minor Thoracic paraspinals Lumbar paraspinals Other injections-    Patient's level of pain prior was 7/10 Current level of pain after injections is 5/10  There was no  bleeding or complications.  Patient was advised to drink a lot of water on day after injections to flush system Will have increased soreness for 12-48 hours after injections.  Can use Lidocaine patches the day AFTER injections Can use theracane on day of injections in places didn't inject Can use heating pad 4-6 hours AFTER injections  5. Discussed using theracane when gets home- focus on side of neck on each side- posterior scalenes.  Hold pressure 2+ minutes- no massage.    6. F/U in 2-3 months  I spent a total of 26 minute son visit- 10 minutes on injections and 16 minutes on f/u discussing migraines.

## 2021-06-10 ENCOUNTER — Other Ambulatory Visit: Payer: Self-pay | Admitting: Physical Medicine and Rehabilitation

## 2021-06-10 ENCOUNTER — Other Ambulatory Visit: Payer: Self-pay | Admitting: Family Medicine

## 2021-06-13 ENCOUNTER — Ambulatory Visit: Payer: Self-pay | Admitting: Internal Medicine

## 2021-06-13 DIAGNOSIS — I951 Orthostatic hypotension: Secondary | ICD-10-CM

## 2021-06-13 DIAGNOSIS — G901 Familial dysautonomia [Riley-Day]: Secondary | ICD-10-CM

## 2021-06-18 ENCOUNTER — Encounter: Payer: 59 | Admitting: Physician Assistant

## 2021-06-18 ENCOUNTER — Encounter: Payer: No Typology Code available for payment source | Admitting: Physician Assistant

## 2021-06-22 ENCOUNTER — Other Ambulatory Visit: Payer: Self-pay | Admitting: Obstetrics and Gynecology

## 2021-06-22 DIAGNOSIS — Z3041 Encounter for surveillance of contraceptive pills: Secondary | ICD-10-CM

## 2021-07-02 ENCOUNTER — Other Ambulatory Visit: Payer: Self-pay | Admitting: Physical Medicine and Rehabilitation

## 2021-07-02 NOTE — Telephone Encounter (Signed)
Refill request for Amitriptyline but I do not see it on med list nor mentioned in last note. Please advise.

## 2021-07-15 ENCOUNTER — Other Ambulatory Visit: Payer: Self-pay | Admitting: Physical Medicine and Rehabilitation

## 2021-08-11 ENCOUNTER — Telehealth: Payer: Self-pay | Admitting: Obstetrics and Gynecology

## 2021-08-11 NOTE — Telephone Encounter (Signed)
This is Dr. Edward Jolly reviewing Dr. Salli Quarry labs while she is out of the office.   Dr. Oscar La received a notice about an interaction between the patient's birth control and her tizanidine.  The birth control can make the effect of the tizanidine stronger.  If she is taking tizanidine, she should contact her physician prescribing this.

## 2021-08-23 ENCOUNTER — Encounter: Payer: 59 | Admitting: Physician Assistant

## 2021-09-01 ENCOUNTER — Other Ambulatory Visit: Payer: Self-pay | Admitting: Physician Assistant

## 2021-09-07 NOTE — Telephone Encounter (Signed)
Encounter reviewed and closed.  

## 2021-09-09 ENCOUNTER — Encounter: Payer: 59 | Admitting: Physical Medicine and Rehabilitation

## 2021-09-14 ENCOUNTER — Other Ambulatory Visit: Payer: Self-pay | Admitting: Obstetrics and Gynecology

## 2021-09-14 DIAGNOSIS — Z3041 Encounter for surveillance of contraceptive pills: Secondary | ICD-10-CM

## 2021-09-16 NOTE — Telephone Encounter (Signed)
Annual exam was on 7/22

## 2021-10-01 NOTE — Progress Notes (Signed)
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  She has already tried amitriptyline, topiramate, beta blocker, Qulipta Migraine rescue:  rizatriptan 167mPromethazine 12.49m37mor nausea. Limit use of pain relievers to no more than 2 days out of week to prevent risk of rebound or medication-overuse headache. Keep headache diary Follow up 7 months.    Subjective:  Kiara Brewer an 20 39ar old right-handed female with fibromyalgia and migraine who follows up for migraine     UPDATE: Started topiramate in July but stopped because it made her feel worse. With daily headache.  Regarding migraines specifically, Intensity:  7/10 Duration:  several hours to all day Frequency:  3 to 4 days a week Frequency of abortive medication: sumatriptan one day a week Current NSAIDS/analgesics:  none Current triptans:  Sumatriptan 29m36mrrent anti-emetic:  Phenergan 12.49mg 3mrent muscle relaxants:  Tizanidine 2-4mg B19mPRN Current Antihypertensive medications:  bisoprolol Current Antidepressant medications:  Cymbalta 60mg d23m, Wellbutrin XL 300mg da71mCurrent Anticonvulsant medications:  Keppra 1000mg BID71mr neuralgia) Current CGRP inhibitor:  none   Caffeine:  Celcius energy drink Diet: Drinks a lot of water and Gatorade.  Snacks throughout day rather than large meals.   Exercise:  Lift weights 6 times a week. . Sometimes running Depression:  yes; Anxiety:  yes Sleep hygiene:  Falls asleep but trouble staying asleep.   HISTORY:  Patient has complex medical history.   When she was in middle school, she fell on the ice landing on her right leg.  It didn't seem too bad but over the next several days, she developed severe pain.  She started experiencing discoloration.  [facility].  In 2019, she began having numbness and tingling in her feet, mostly the right foot.  She would intermittently have a drop foot, causing her leg to give out.  She had a NCV-EMG of the right lower extremity in December 2019 but she was unable to tolerate it and it was abruptly stopped.  NCV showed prolonged distal motor latency and low motor amplitude of the right peroneal nerve with slowing above and below the fibular head.  Right sural and superficial peroneal nerves were normal.  On needle exam, the right tibialis anterior muscle was normal but she then refused to continue the study.  Lab work included negative workup for autoimmune disease - including ANA, sed rate, CRP, HLA-B27.  B12, folate, TSH, CK and aldolase have been normal.  She is under the care of pain management.   She has had headaches off and on since childhood but she started having more severe and frequent migraines in 2020.  She describes a severe stabbing and pounding pain behind the ears or in the eyes, either side.  There is associated nausea, vomiting, photophobia, phonophobia and osmophobia.  If she is able to take sumatriptan within 15 minutes of onset, then it resolves within an other, otherwise it lasts all day.  They occur 2 to 3 days a week.  She also has a daily dull headache for which she takes Excedrin daily.     Also since 2020, she has had recurrent episodes of passing out.  She describes feeling hot and lightheaded with heaviness in her legs and then passes out.  It  lasts only a few seconds.  No convulsions, incontinence, tongue/cheek biting.  No postictal confusion.  It occurs 2-3 times a day..  Sometimes preceded briefly by sharp severe headache.  It mostly occurs with change in position, such as bending over, but some are spontaneous.  She has been diagnosed with orthostatic hypotension.  She feels lightheaded when she  stands up from sitting.  Unclear if related to polypharmacy.  She has been referred cardiology.  TTE unremarkable with normal EF and no valvular abnormalities.  She was ordered a 14 day ZIO patch but only tolerated 4 days due to skin irritation.  Heart rate demonstrated 43-173bpm with average 93 bpm.  Rare PVCs and PACs.  Events corresponded to sinus rhythm or sinus tachycardia but no SVT.  Cardiology does not suspect POTS, Marfan's or Ehlers Danlos Syndrome.  Due to hitting her head from a fall, she had a CT of head of head without contrast on 08/29/2020 personally reviewed which was slightly limited by streak artifact from metal portions of hair extension but appeared normal.   MRI of brain with and without contrast on 01/18/2021 was normal.  Routine EEG on 01/10/2021 was normal.  48 hour ambulatory EEG in April 2022 was normal.  Captured 3 habitual events without electrographic correlate.       Past NSAIDS/analgesics:  Norco, Excedrin  Past abortive triptans:  none Past muscle relaxants:  baclofen Past anti-emetic:  Zofran Past antihypertensive medications:  atenolol, metoprolol Past antidepressant medications:  amitriptyline Past anticonvulsant medications:  Topiramate, gabapentin, Lyrica Past CGRP inhibitor:  Qulipta (made headaches worse) Other past therapies:  none     Family history of headache:  Father (migraines), mother (occasional headaches)  PAST MEDICAL HISTORY: Past Medical History:  Diagnosis Date   Anal pain    Anxiety    Complex regional pain syndrome of both lower extremities    per pt w/ right thigh numbness   Depression    Exercise-induced asthma    Fainting episodes    Fibromyalgia    GERD (gastroesophageal reflux disease)    History of concussion 04/2019   per pt no residual   History of Osgood-Schlatter disease    History of syncope    ED visit 08-29-2020 w/ cp, palpitations--- pt sent for cardiology evaluation , dr Quentin Ore, office note 09-07-2020 dx orthostatic  intolerence ;   echo in epic 09-14-2020 , normal;  event monitor 09-13-2020 no signfiance arrhythmia , SR/ST;  CT angio chest 09-02-2020  normal   Irritable bowel    Migraine without aura    Orthostatic hypotension    Sacral radiculopathy    Spondylolisthesis, lumbosacral region    L5--S1   Wears contact lenses     MEDICATIONS: Current Outpatient Medications on File Prior to Visit  Medication Sig Dispense Refill   albuterol (PROVENTIL HFA;VENTOLIN HFA) 108 (90 Base) MCG/ACT inhaler Inhale 2 puffs into the lungs every 6 (six) hours as needed for wheezing or shortness of breath. 1 Inhaler 3   amitriptyline (ELAVIL) 25 MG tablet TAKE 1 TABLET BY MOUTH EVERYDAY AT BEDTIME 90 tablet 0   atenolol (TENORMIN) 25 MG tablet Take 0.5 tablets (12.5 mg total) by mouth daily. 15 tablet 0   Atogepant (QULIPTA) 60 MG TABS Take 60 mg by mouth daily. 30 tablet 5   bisoprolol (ZEBETA) 5 MG tablet TAKE 0.5 TABLET BY MOUTH DAILY 45 tablet 3   buPROPion (WELLBUTRIN XL) 300 MG 24 hr tablet TAKE 1 TABLET BY MOUTH EVERY DAY 90 tablet 0  calcium carbonate (TUMS - DOSED IN MG ELEMENTAL CALCIUM) 500 MG chewable tablet Chew 1 tablet by mouth as needed for indigestion or heartburn.     DULoxetine (CYMBALTA) 60 MG capsule TAKE 1 CAPSULE BY MOUTH EVERY DAY 90 capsule 1   EPINEPHrine 0.3 mg/0.3 mL IJ SOAJ injection Inject 0.3 mg into the muscle daily as needed for anaphylaxis.     metoprolol tartrate (LOPRESSOR) 25 MG tablet TAKE 0.5 TABLET (12.5 MG) BY MOUTH 2 TIMES DAILY. 30 tablet 3   montelukast (SINGULAIR) 10 MG tablet TAKE 1 TABLET BY MOUTH EVERY DAY 90 tablet 0   Multiple Vitamin (MULTIVITAMIN) tablet Take 1 tablet by mouth daily.     norethindrone-ethinyl estradiol (JUNEL 1/20) 1-20 MG-MCG tablet TAKE 1 TABLET BY MOUTH DAILY. TAKE CONTINUOUSLY, SKIP PLACEBO WEEK 112 tablet 2   omeprazole (PRILOSEC) 20 MG capsule Take 1 capsule (20 mg total) by mouth daily. 90 capsule 1   promethazine (PHENERGAN) 12.5 MG tablet  Take 1 tablet (12.5 mg total) by mouth every 6 (six) hours as needed for nausea or vomiting. 90 tablet 5   SUMAtriptan (IMITREX) 50 MG tablet Take 1 tablet (50 mg total) by mouth every 2 (two) hours as needed for migraine. May repeat in 2 hours if headache persists or recurs. 10 tablet 5   tiZANidine (ZANAFLEX) 4 MG tablet TAKE 0.5-1 TABLETS (2-4 MG TOTAL) BY MOUTH 2 (TWO) TIMES DAILY AS NEEDED FOR MUSCLE SPASMS. 180 tablet 1   topiramate (TOPAMAX) 25 MG tablet Take 1 tablet (25 mg total) by mouth at bedtime. 30 tablet 5   Current Facility-Administered Medications on File Prior to Visit  Medication Dose Route Frequency Provider Last Rate Last Admin   0.9 %  sodium chloride infusion  500 mL Intravenous Once Armbruster, Carlota Raspberry, MD        ALLERGIES: Allergies  Allergen Reactions   Pecan Extract Allergy Skin Test Anaphylaxis   Strawberry Extract Anaphylaxis   Other Hives    Red Meat    Gluten Meal    Shellfish Allergy     lobster    FAMILY HISTORY: Family History  Problem Relation Age of Onset   Arthritis Mother    Asthma Mother    Miscarriages / [place] Mother    Migraines Mother    COPD Father        per pt's mother-mild   Hypertension Father    Migraines Father    Asthma Brother    Migraines Brother    Anxiety disorder Brother    Arthritis Maternal Grandmother    Cancer Maternal Grandmother    Miscarriages / Stillbirths Maternal Grandmother    Diabetes Maternal Grandfather    Heart attack Maternal Grandfather    Heart disease Maternal Grandfather    Hyperlipidemia Maternal Grandfather    Hypertension Maternal Grandfather    Stroke Maternal Grandfather    Colon polyps Maternal Grandfather    Cancer Paternal Grandmother    Alcohol abuse Paternal Grandfather    Cancer Paternal Grandfather    COPD Paternal Grandfather    Heart attack Paternal Grandfather    Heart disease Paternal Grandfather    Hyperlipidemia Paternal Grandfather    Hypertension Paternal  Grandfather    Asthma Brother    OCD Brother    Anxiety disorder Brother    Seizures Neg Hx    Depression Neg Hx    Bipolar disorder Neg Hx    Schizophrenia Neg Hx    ADD / ADHD Neg Hx  Autism Neg Hx    Colon cancer Neg Hx    Esophageal cancer Neg Hx    Rectal cancer Neg Hx    Stomach cancer Neg Hx       Objective:  Blood pressure 116/71, pulse 83, height '5\' 7"'  (1.702 m), weight 162 lb (73.5 kg), SpO2 100 %. General: No acute distress.  Patient appears well-groomed.   Head:  Normocephalic/atraumatic Eyes:  Fundi examined but not visualized Neck: supple, no paraspinal tenderness, full range of motion Heart:  Regular rate and rhythm Lungs:  Clear to auscultation bilaterally Back: No paraspinal tenderness Neurological Exam: alert and oriented to person, place, and time.  Speech fluent and not dysarthric, language intact.  CN II-XII intact. Bulk and tone normal, muscle strength 5/5 throughout.  Sensation to light touch intact.  Deep tendon reflexes 2+ throughout, toes downgoing.  Finger to nose testing intact.  Gait normal, Romberg negative.   Metta Clines, DO  CC:  Alyssa Allwardt, PA-C

## 2021-10-02 ENCOUNTER — Other Ambulatory Visit: Payer: Self-pay

## 2021-10-02 ENCOUNTER — Ambulatory Visit (INDEPENDENT_AMBULATORY_CARE_PROVIDER_SITE_OTHER): Payer: 59 | Admitting: Neurology

## 2021-10-02 ENCOUNTER — Encounter: Payer: Self-pay | Admitting: Neurology

## 2021-10-02 VITALS — BP 116/71 | HR 83 | Ht 67.0 in | Wt 162.0 lb

## 2021-10-02 DIAGNOSIS — G901 Familial dysautonomia [Riley-Day]: Secondary | ICD-10-CM | POA: Diagnosis not present

## 2021-10-02 DIAGNOSIS — G43709 Chronic migraine without aura, not intractable, without status migrainosus: Secondary | ICD-10-CM | POA: Diagnosis not present

## 2021-10-02 MED ORDER — PROMETHAZINE HCL 12.5 MG PO TABS
12.5000 mg | ORAL_TABLET | Freq: Four times a day (QID) | ORAL | 5 refills | Status: DC | PRN
Start: 2021-10-02 — End: 2022-08-11

## 2021-10-02 MED ORDER — PROMETHAZINE HCL 12.5 MG PO TABS: 12.5000 mg | ORAL_TABLET | Freq: Four times a day (QID) | ORAL | 5 refills | Status: DC | PRN

## 2021-10-02 MED ORDER — RIZATRIPTAN BENZOATE 10 MG PO TBDP: ORAL_TABLET | ORAL | 5 refills | Status: DC

## 2021-10-02 MED ORDER — AIMOVIG 140 MG/ML ~~LOC~~ SOAJ: 140.0000 mg | SUBCUTANEOUS | 5 refills | Status: DC

## 2021-10-02 NOTE — Patient Instructions (Addendum)
Start Aimovig 140mg  injection every 28 days Stop sumatriptan.  At earliest onset of migraine, take rizatriptan 10mg .  May repeat after 2 hours if needed (maximum 2 tablets in 24 hours).  Promethazine for nausea. Limit use of pain relievers to no more than 2 days out of week to prevent risk of rebound or medication-overuse headache. Keep headache diary Follow up in 7 months.

## 2021-10-02 NOTE — Progress Notes (Signed)
Medication Samples have been provided to the patient.  Drug name: Aimovig       Strength: 140 mg        Qty: 1   LOT: [id number]  Exp.Date: 03/26/2022  Dosing instructions: Every 30 days  The patient has been instructed regarding the correct time, dose, and frequency of taking this medication, including desired effects and most common side effects.   Leida Lauth 2:49 PM 10/02/2021

## 2021-10-08 ENCOUNTER — Telehealth: Payer: Self-pay

## 2021-10-08 NOTE — Telephone Encounter (Signed)
New message   I called the patient twice at  [phone] and left a detailed voicemail to reach out to her insurance company for an update on the address.    Website  CoverMyMeds   Message from Plan Patient authentication failed. The patient's zip code and/or phone number provided do not match our records. Please update the request and resubmit via ePA.  Receive fax from [facility] medication Aimovig  140 mg

## 2021-10-09 ENCOUNTER — Ambulatory Visit (INDEPENDENT_AMBULATORY_CARE_PROVIDER_SITE_OTHER): Payer: 59 | Admitting: Internal Medicine

## 2021-10-09 ENCOUNTER — Encounter: Payer: Self-pay | Admitting: Internal Medicine

## 2021-10-09 ENCOUNTER — Other Ambulatory Visit: Payer: Self-pay

## 2021-10-09 VITALS — BP 110/68 | HR 84 | Ht 67.0 in

## 2021-10-09 DIAGNOSIS — G901 Familial dysautonomia [Riley-Day]: Secondary | ICD-10-CM

## 2021-10-09 DIAGNOSIS — I951 Orthostatic hypotension: Secondary | ICD-10-CM

## 2021-10-09 MED ORDER — BISOPROLOL FUMARATE 5 MG PO TABS: 5.0000 mg | ORAL_TABLET | Freq: Every day | ORAL | 3 refills | Status: DC

## 2021-10-09 NOTE — Progress Notes (Signed)
Patient Care Team: Allwardt, Crist Infante, PA-C as PCP - General (Physician Assistant) Meriam Sprague, MD as PCP - Cardiology (Cardiology)   HPI  Kiara Brewer is a 20 y.o. female seen in follow-up for orthostatic intolerance, syncope, complex pain issues, including fibromyalgia and complex regional pain syndrome, anxiety and Depression.  With tachypalpitations no change in blood pressure, given prescriptions to try low-dose beta-blockers.  The report from her story that was complicated and discordant.  (See prior note) had [facility], Dr. Berneice Gandy or or [facility].    She says that her heart rate has been bad recently, as well as having nausea and dizziness. Heart rate can change dramatically, saying that it can go as low as 30-45 bpm.  In regards to heart rate, she notices that it is racing while sitting, and when going from a sitting position to a standing position, the heart rate changes significantly and produces the sensation of feeling like she is going to faint. She admits that they are mostly linked together.  The beta blockers given were all tried. The one that she has stuck to using is Zebeta, which she splits into unequal halves, taking a half 1x a day. She states that she feels better on the days where she takes the larger half, and has not taken a whole pill each day.  She states that she has difficulty with catching her breath, and has difficulty raising her arms to wash her hair in the shower, washing her hair 1-2x a week.   Crouching causes worsening of symptoms, with dizziness and hot sensation, and going from crouching to standing she can feel like she is going to pass out.   Her mother states that when her daughter's husband picks her up over his shoulder, she passes out immediately.  To increase water and sodium intake, she makes sure to add salt to her food, has gatorade and similar,  pedialyte care, and liquid IV's.  When exercising she wears compression socks/leggings, as well as a waist compressor band. This would sometime produce alleviation, but not always. She lifts for exercise. Due to the lifting motion this produces the fainting sensation as well, having to stop occasionally to prevent syncope. She states that sometimes she does have syncope episodes while exercising.   Her mother says that she can tell that she is going to have a syncope episode based upon significant eye dilation, and the patient says that she thinks that the syncope could be related to lighting conditions.     Past Medical History:  Diagnosis Date   Anal pain    Anxiety    Complex regional pain syndrome of both lower extremities    per pt w/ right thigh numbness   Depression    Exercise-induced asthma    Fainting episodes    Fibromyalgia    GERD (gastroesophageal reflux disease)    History of concussion 04/2019   per pt no residual   History of Osgood-Schlatter disease    History of syncope    ED visit 08-29-2020 w/ cp, palpitations--- pt sent for cardiology evaluation , dr Lalla Brothers, office note 09-07-2020 dx orthostatic intolerence ;   echo in epic 09-14-2020 , normal;  event monitor 09-13-2020 no signfiance arrhythmia , SR/ST;  CT angio chest 09-02-2020  normal   Irritable bowel    Migraine without aura    Orthostatic hypotension    Sacral radiculopathy  Spondylolisthesis, lumbosacral region    L5--S1   Wears contact lenses     Past Surgical History:  Procedure Laterality Date   RECTAL EXAM UNDER ANESTHESIA N/A 11/22/2020   Procedure: ANAL  EXAM UNDER ANESTHESIA;  Surgeon: Romie Levee, MD;  Location: [facility];  Service: General;  Laterality: N/A;   SPHINCTEROTOMY N/A 11/22/2020   Procedure: CHEMICAL SPHINCTEROTOMY/ FISSURECTOMY;  Surgeon: Romie Levee, MD;  Location: [facility];  Service: General;  Laterality: N/A;   TONSILLECTOMY AND  ADENOIDECTOMY  2010   WISDOM TOOTH EXTRACTION  2020    Current Meds  Medication Sig   albuterol (PROVENTIL HFA;VENTOLIN HFA) 108 (90 Base) MCG/ACT inhaler Inhale 2 puffs into the lungs every 6 (six) hours as needed for wheezing or shortness of breath.   bisoprolol (ZEBETA) 5 MG tablet TAKE 0.5 TABLET BY MOUTH DAILY   buPROPion (WELLBUTRIN XL) 300 MG 24 hr tablet TAKE 1 TABLET BY MOUTH EVERY DAY   calcium carbonate (TUMS - DOSED IN MG ELEMENTAL CALCIUM) 500 MG chewable tablet Chew 1 tablet by mouth as needed for indigestion or heartburn.   DULoxetine (CYMBALTA) 60 MG capsule TAKE 1 CAPSULE BY MOUTH EVERY DAY   EPINEPHrine 0.3 mg/0.3 mL IJ SOAJ injection Inject 0.3 mg into the muscle daily as needed for anaphylaxis.   Erenumab-aooe (AIMOVIG) 140 MG/ML SOAJ Inject 140 mg into the skin every 28 (twenty-eight) days.   montelukast (SINGULAIR) 10 MG tablet TAKE 1 TABLET BY MOUTH EVERY DAY   Multiple Vitamin (MULTIVITAMIN) tablet Take 1 tablet by mouth daily.   norethindrone-ethinyl estradiol (JUNEL 1/20) 1-20 MG-MCG tablet TAKE 1 TABLET BY MOUTH DAILY. TAKE CONTINUOUSLY, SKIP PLACEBO WEEK   omeprazole (PRILOSEC) 20 MG capsule Take 1 capsule (20 mg total) by mouth daily.   promethazine (PHENERGAN) 12.5 MG tablet Take 1 tablet (12.5 mg total) by mouth every 6 (six) hours as needed for nausea or vomiting.   rizatriptan (MAXALT-MLT) 10 MG disintegrating tablet Take 1 tablet earliest onset of migraine.  May repeat in 2 hours if needed.  Maximum 2 tablets in 24 hours.   tiZANidine (ZANAFLEX) 4 MG tablet TAKE 0.5-1 TABLETS (2-4 MG TOTAL) BY MOUTH 2 (TWO) TIMES DAILY AS NEEDED FOR MUSCLE SPASMS.   Current Facility-Administered Medications for the 10/09/21 encounter (Office Visit) with Duke Salvia, MD  Medication   0.9 %  sodium chloride infusion    Allergies  Allergen Reactions   Pecan Extract Allergy Skin Test Anaphylaxis   Strawberry Extract Anaphylaxis   Other Hives    Red Meat    Gluten Meal     Shellfish Allergy     lobster      Review of Systems negative except from HPI and PMH  Physical Exam BP 110/68    Pulse 84    Ht 5\' 7"  (1.702 m)    SpO2 96%    BMI 25.37 kg/m  Well developed and well nourished in no acute distress HENT normal E scleral and icterus clear Neck Supple JVP flat; carotids brisk and full Clear to ausculation Regular rate and rhythm, no murmurs gallops or rub Soft with active bowel sounds No clubbing cyanosis  Edema Alert and oriented, grossly normal motor and sensory function Skin Warm and Dry  ECG sinus @ 84 18/09/35  CrCl cannot be calculated (Patient's most recent lab result is older than the maximum 21 days allowed.).   Assessment and  Plan Recurrent syncope   Orthostatic intolerance   Complex regional pain  Fibromyalgia   Anxiety/depression  Continues to struggle significantly with symptoms.  Again there are things that are discordant, including her dizziness and syncope without pallor and significant symptoms this today even associated with out change in orthostatic vital signs.  We have again reviewed the physiology of dysautonomia to the degree that that might be part or all of this and some atypical way.  We will push salt and water.  Discussed the importance of changing her weight lifting exercises.  Suggested she shower at night not in the morning.  Have written given her letter for accommodation at school.  Handicap sticker.  Again suggested that she consider referral to [facility] and see Dr. Berneice Gandy at [facility].  Current medicines are reviewed at length with the patient today .  The patient does not  have concerns regarding medicines.   I,Jordan Kelly,acting as a Neurosurgeon for Sherryl Manges, MD.,have documented all relevant documentation on the behalf of Sherryl Manges, MD,as directed by  Sherryl Manges, MD while in the presence of Sherryl Manges, MD. I, Sherryl Manges, MD, have reviewed all documentation for this visit. The  documentation on 10/09/21 for the exam, diagnosis, procedures, and orders are all accurate and complete.

## 2021-10-09 NOTE — Progress Notes (Deleted)
Patient Care Team: Allwardt, Crist Infante, PA-C as PCP - General (Physician Assistant) Meriam Sprague, MD as PCP - Cardiology (Cardiology)   HPI  Kiara Brewer is a 20 y.o. female seen in follow-up for orthostatic intolerance, syncope, complex pain issues, including fibromyalgia and complex regional pain syndrome, anxiety and Depression.  With tachypalpitations no change in blood pressure, given prescriptions to try low-dose beta-blockers.  The report from her story that was complicated and discordant.  (See prior note) had suggested [facility], Dr. Berneice Gandy or or [facility].  Records and Results Reviewed***  Past Medical History:  Diagnosis Date   Anal pain    Anxiety    Complex regional pain syndrome of both lower extremities    per pt w/ right thigh numbness   Depression    Exercise-induced asthma    Fainting episodes    Fibromyalgia    GERD (gastroesophageal reflux disease)    History of concussion 04/2019   per pt no residual   History of Osgood-Schlatter disease    History of syncope    ED visit 08-29-2020 w/ cp, palpitations--- pt sent for cardiology evaluation , dr Lalla Brothers, office note 09-07-2020 dx orthostatic intolerence ;   echo in epic 09-14-2020 , normal;  event monitor 09-13-2020 no signfiance arrhythmia , SR/ST;  CT angio chest 09-02-2020  normal   Irritable bowel    Migraine without aura    Orthostatic hypotension    Sacral radiculopathy    Spondylolisthesis, lumbosacral region    L5--S1   Wears contact lenses     Past Surgical History:  Procedure Laterality Date   RECTAL EXAM UNDER ANESTHESIA N/A 11/22/2020   Procedure: ANAL  EXAM UNDER ANESTHESIA;  Surgeon: Romie Levee, MD;  Location: [facility] [place];  Service: General;  Laterality: N/A;   SPHINCTEROTOMY N/A 11/22/2020   Procedure: CHEMICAL SPHINCTEROTOMY/ FISSURECTOMY;  Surgeon: Romie Levee, MD;  Location: [place]  [place];  Service: General;  Laterality: N/A;   TONSILLECTOMY AND ADENOIDECTOMY  2010   WISDOM TOOTH EXTRACTION  2020    Current Meds  Medication Sig   albuterol (PROVENTIL HFA;VENTOLIN HFA) 108 (90 Base) MCG/ACT inhaler Inhale 2 puffs into the lungs every 6 (six) hours as needed for wheezing or shortness of breath.   bisoprolol (ZEBETA) 5 MG tablet TAKE 0.5 TABLET BY MOUTH DAILY   buPROPion (WELLBUTRIN XL) 300 MG 24 hr tablet TAKE 1 TABLET BY MOUTH EVERY DAY   calcium carbonate (TUMS - DOSED IN MG ELEMENTAL CALCIUM) 500 MG chewable tablet Chew 1 tablet by mouth as needed for indigestion or heartburn.   DULoxetine (CYMBALTA) 60 MG capsule TAKE 1 CAPSULE BY MOUTH EVERY DAY   EPINEPHrine 0.3 mg/0.3 mL IJ SOAJ injection Inject 0.3 mg into the muscle daily as needed for anaphylaxis.   Erenumab-aooe (AIMOVIG) 140 MG/ML SOAJ Inject 140 mg into the skin every 28 (twenty-eight) days.   montelukast (SINGULAIR) 10 MG tablet TAKE 1 TABLET BY MOUTH EVERY DAY   Multiple Vitamin (MULTIVITAMIN) tablet Take 1 tablet by mouth daily.   norethindrone-ethinyl estradiol (JUNEL 1/20) 1-20 MG-MCG tablet TAKE 1 TABLET BY MOUTH DAILY. TAKE CONTINUOUSLY, SKIP PLACEBO WEEK   omeprazole (PRILOSEC) 20 MG capsule Take 1 capsule (20 mg total) by mouth daily.   promethazine (PHENERGAN) 12.5 MG tablet Take 1 tablet (12.5 mg total) by mouth every 6 (six) hours as needed for nausea or vomiting.   rizatriptan (MAXALT-MLT) 10  MG disintegrating tablet Take 1 tablet earliest onset of migraine.  May repeat in 2 hours if needed.  Maximum 2 tablets in 24 hours.   tiZANidine (ZANAFLEX) 4 MG tablet TAKE 0.5-1 TABLETS (2-4 MG TOTAL) BY MOUTH 2 (TWO) TIMES DAILY AS NEEDED FOR MUSCLE SPASMS.   Current Facility-Administered Medications for the 10/09/21 encounter (Office Visit) with Duke Salvia, MD  Medication   0.9 %  sodium chloride infusion    Allergies  Allergen Reactions   Pecan Extract Allergy Skin Test  Anaphylaxis   Strawberry Extract Anaphylaxis   Other Hives    Red Meat    Gluten Meal    Shellfish Allergy     lobster      Review of Systems negative except from HPI and PMH  Physical Exam BP 110/68    Pulse 84    Ht 5\' 7"  (1.702 m)    SpO2 96%    BMI 25.37 kg/m  Well developed and well nourished in no acute distress HENT normal E scleral and icterus clear Neck Supple JVP flat; carotids brisk and full Clear to ausculation {CARD RHYTHM:10874} ***Regular rate and rhythm, no murmurs gallops or rub Soft with active bowel sounds No clubbing cyanosis {Numbers; edema:17696} Edema Alert and oriented, grossly normal motor and sensory function Skin Warm and Dry  ECG ***  CrCl cannot be calculated (Patient's most recent lab result is older than the maximum 21 days allowed.).   Assessment and  Plan Recurrent syncope   Orthostatic intolerance   Complex regional pain   Fibromyalgia   Anxiety/depression      Current medicines are reviewed at length with the patient today .  The patient does not*** have concerns regarding medicines.

## 2021-10-09 NOTE — Patient Instructions (Addendum)
Medication Instructions:  Your physician has recommended you make the following change in your medication:   ** Increase your Bisoprolol from 2.5mg  to 5 mg - 1 tablet by mouth daily   *If you need a refill on your cardiac medications before your next appointment, please call your pharmacy*   Lab Work: None ordered.  If you have labs (blood work) drawn today and your tests are completely normal, you will receive your results only by: MyChart Message (if you have MyChart) OR A paper copy in the mail If you have any lab test that is abnormal or we need to change your treatment, we will call you to review the results.   Testing/Procedures: None ordered.    Follow-Up: At [facility], you and your health needs are our priority.  As part of our continuing mission to provide you with exceptional heart care, we have created designated Provider Care Teams.  These Care Teams include your primary Cardiologist (physician) and Advanced Practice Providers (APPs -  Physician Assistants and Nurse Practitioners) who all work together to provide you with the care you need, when you need it.  We recommend signing up for the patient portal called "MyChart".  Sign up information is provided on this After Visit Summary.  MyChart is used to connect with patients for Virtual Visits (Telemedicine).  Patients are able to view lab/test results, encounter notes, upcoming appointments, etc.  Non-urgent messages can be sent to your provider as well.   To learn more about what you can do with MyChart, go to [email or web address].    Your next appointment:   02/13/2022 at 2pm, virtual visit

## 2021-10-11 ENCOUNTER — Other Ambulatory Visit: Payer: Self-pay

## 2021-10-11 ENCOUNTER — Emergency Department (HOSPITAL_BASED_OUTPATIENT_CLINIC_OR_DEPARTMENT_OTHER)
Admission: EM | Admit: 2021-10-11 | Discharge: 2021-10-11 | Disposition: A | Discharge status: Home / Self Care | Payer: 59 | Attending: Emergency Medicine | Admitting: Emergency Medicine

## 2021-10-11 ENCOUNTER — Encounter (HOSPITAL_BASED_OUTPATIENT_CLINIC_OR_DEPARTMENT_OTHER): Payer: Self-pay | Admitting: Emergency Medicine

## 2021-10-11 DIAGNOSIS — R112 Nausea with vomiting, unspecified: Secondary | ICD-10-CM | POA: Diagnosis not present

## 2021-10-11 DIAGNOSIS — R197 Diarrhea, unspecified: Secondary | ICD-10-CM | POA: Insufficient documentation

## 2021-10-11 NOTE — ED Provider Notes (Signed)
[place] EMERGENCY DEPARTMENT Provider Note   CSN: [id number] Arrival date & time: 10/11/21  1817     History No chief complaint on file.   Kiara Brewer is a 20 y.o. female.  The history is provided by the patient.  Diarrhea Quality:  Watery Severity:  Mild Duration:  6 days Timing:  Intermittent Relieved by:  Nothing Worsened by:  Nothing Associated symptoms: vomiting   Associated symptoms: no abdominal pain, no arthralgias, no chills, no recent cough, no diaphoresis, no fever, no headaches and no myalgias   Risk factors: no recent antibiotic use and no suspicious food intake       Past Medical History:  Diagnosis Date   Anal pain    Anxiety    Complex regional pain syndrome of both lower extremities    per pt w/ right thigh numbness   Depression    Exercise-induced asthma    Fainting episodes    Fibromyalgia    GERD (gastroesophageal reflux disease)    History of concussion 04/2019   per pt no residual   History of Osgood-Schlatter disease    History of syncope    ED visit 08-29-2020 w/ cp, palpitations--- pt sent for cardiology evaluation , dr Quentin Ore, office note 09-07-2020 dx orthostatic intolerence ;   echo in epic 09-14-2020 , normal;  event monitor 09-13-2020 no signfiance arrhythmia , SR/ST;  CT angio chest 09-02-2020  normal   Irritable bowel    Migraine without aura    Orthostatic hypotension    Sacral radiculopathy    Spondylolisthesis, lumbosacral region    L5--S1   Wears contact lenses     Patient Active Problem List   Diagnosis Date Noted   Dysautonomia ([place]) 06/03/2021   Orthostatic intolerance 06/03/2021   Myofascial pain 10/08/2020   Depression, recurrent ([place]) 09/11/2020   Syncope 09/07/2020   Fibromyalgia    Migraine without aura    Vasovagal syncope 11/23/2019   Complex regional pain syndrome i of lower limb, bilateral 01/11/2019   Right leg numbness 08/31/2018   New onset headache 08/31/2018   Exercise-induced  asthma 08/13/2018   Osgood-Schlatter's disease 08/13/2018   Complex regional pain syndrome of lower extremity 08/13/2018   Sacral radiculopathy 08/02/2018   Spondylolisthesis at L5-S1 level 08/02/2018    Past Surgical History:  Procedure Laterality Date   RECTAL EXAM UNDER ANESTHESIA N/A 11/22/2020   Procedure: ANAL  EXAM UNDER ANESTHESIA;  Surgeon: Leighton Ruff, MD;  Location: [place];  Service: General;  Laterality: N/A;   SPHINCTEROTOMY N/A 11/22/2020   Procedure: CHEMICAL SPHINCTEROTOMY/ FISSURECTOMY;  Surgeon: Leighton Ruff, MD;  Location: [place];  Service: General;  Laterality: N/A;   TONSILLECTOMY AND ADENOIDECTOMY  2010   WISDOM TOOTH EXTRACTION  2020     OB History     Gravida  0   Para  0   Term  0   Preterm  0   AB  0   Living  0      SAB  0   IAB  0   Ectopic  0   Multiple  0   Live Births  0           Family History  Problem Relation Age of Onset   Arthritis Mother    Asthma Mother    Miscarriages / [place] Mother    Migraines Mother    COPD Father        per pt's mother-mild   Hypertension Father  Migraines Father    Asthma Brother    Migraines Brother    Anxiety disorder Brother    Arthritis Maternal Grandmother    Cancer Maternal Grandmother    Miscarriages / Stillbirths Maternal Grandmother    Diabetes Maternal Grandfather    Heart attack Maternal Grandfather    Heart disease Maternal Grandfather    Hyperlipidemia Maternal Grandfather    Hypertension Maternal Grandfather    Stroke Maternal Grandfather    Colon polyps Maternal Grandfather    Cancer Paternal Grandmother    Alcohol abuse Paternal Grandfather    Cancer Paternal Grandfather    COPD Paternal Grandfather    Heart attack Paternal Grandfather    Heart disease Paternal Grandfather    Hyperlipidemia Paternal Grandfather    Hypertension Paternal Grandfather    Asthma Brother    OCD Brother    Anxiety disorder Brother     Seizures Neg Hx    Depression Neg Hx    Bipolar disorder Neg Hx    Schizophrenia Neg Hx    ADD / ADHD Neg Hx    Autism Neg Hx    Colon cancer Neg Hx    Esophageal cancer Neg Hx    Rectal cancer Neg Hx    Stomach cancer Neg Hx     Social History   Tobacco Use   Smoking status: Never   Smokeless tobacco: Never  Vaping Use   Vaping Use: Never used  Substance Use Topics   Alcohol use: Never   Drug use: Never    Home Medications Prior to Admission medications   Medication Sig Start Date End Date Taking? Authorizing Provider  albuterol (PROVENTIL HFA;VENTOLIN HFA) 108 (90 Base) MCG/ACT inhaler Inhale 2 puffs into the lungs every 6 (six) hours as needed for wheezing or shortness of breath. 12/15/18   Orland Mustard, MD  bisoprolol (ZEBETA) 5 MG tablet Take 1 tablet (5 mg total) by mouth daily. 10/09/21   Duke Salvia, MD  buPROPion (WELLBUTRIN XL) 300 MG 24 hr tablet TAKE 1 TABLET BY MOUTH EVERY DAY 06/10/21   Allwardt, Crist Infante, PA-C  calcium carbonate (TUMS - DOSED IN MG ELEMENTAL CALCIUM) 500 MG chewable tablet Chew 1 tablet by mouth as needed for indigestion or heartburn.    [provider]  DULoxetine (CYMBALTA) 60 MG capsule TAKE 1 CAPSULE BY MOUTH EVERY DAY 05/15/21   Lovorn, Aundra Millet, MD  EPINEPHrine 0.3 mg/0.3 mL IJ SOAJ injection Inject 0.3 mg into the muscle daily as needed for anaphylaxis.    [provider]  Erenumab-aooe (AIMOVIG) 140 MG/ML SOAJ Inject 140 mg into the skin every 28 (twenty-eight) days. 10/02/21   Everlena Cooper, Virgilene Stryker R, DO  montelukast (SINGULAIR) 10 MG tablet TAKE 1 TABLET BY MOUTH EVERY DAY 09/03/21   Allwardt, Crist Infante, PA-C  Multiple Vitamin (MULTIVITAMIN) tablet Take 1 tablet by mouth daily.    [provider]  norethindrone-ethinyl estradiol (JUNEL 1/20) 1-20 MG-MCG tablet TAKE 1 TABLET BY MOUTH DAILY. TAKE CONTINUOUSLY, SKIP PLACEBO WEEK 09/16/21   Romualdo Bolk, MD  omeprazole (PRILOSEC) 20 MG capsule Take 1 capsule (20 mg  total) by mouth daily. 01/29/21   Armbruster, Willaim Rayas, MD  promethazine (PHENERGAN) 12.5 MG tablet Take 1 tablet (12.5 mg total) by mouth every 6 (six) hours as needed for nausea or vomiting. 10/02/21   Drema Dallas, DO  rizatriptan (MAXALT-MLT) 10 MG disintegrating tablet Take 1 tablet earliest onset of migraine.  May repeat in 2 hours if needed.  Maximum 2  tablets in 24 hours. 10/02/21   Jaffe, Bowen Kia R, DO  tiZANidine (ZANAFLEX) 4 MG tablet TAKE 0.5-1 TABLETS (2-4 MG TOTAL) BY MOUTH 2 (TWO) TIMES DAILY AS NEEDED FOR MUSCLE SPASMS. 06/10/21   Lovorn, Jinny Blossom, MD    Allergies    Pecan extract allergy skin test, Strawberry extract, Other, Gluten meal, and Shellfish allergy  Review of Systems   Review of Systems  Constitutional:  Negative for chills, diaphoresis and fever.  HENT:  Negative for ear pain and sore throat.   Eyes:  Negative for pain and visual disturbance.  Respiratory:  Negative for cough and shortness of breath.   Cardiovascular:  Negative for chest pain and palpitations.  Gastrointestinal:  Positive for diarrhea, nausea and vomiting. Negative for abdominal pain.  Genitourinary:  Negative for dysuria and hematuria.  Musculoskeletal:  Negative for arthralgias, back pain and myalgias.  Skin:  Negative for color change and rash.  Neurological:  Negative for seizures, syncope and headaches.  All other systems reviewed and are negative.  Physical Exam Updated Vital Signs There were no vitals taken for this visit.  Physical Exam Vitals and nursing note reviewed.  Constitutional:      General: She is not in acute distress.    Appearance: She is well-developed. She is ill-appearing.  HENT:     Head: Normocephalic and atraumatic.     Mouth/Throat:     Mouth: Mucous membranes are moist.  Eyes:     Conjunctiva/sclera: Conjunctivae normal.  Cardiovascular:     Rate and Rhythm: Normal rate and regular rhythm.     Heart sounds: No murmur heard. Pulmonary:     Effort: Pulmonary  effort is normal. No respiratory distress.     Breath sounds: Normal breath sounds.  Abdominal:     General: There is no distension.     Palpations: Abdomen is soft.     Tenderness: There is no abdominal tenderness.  Musculoskeletal:        General: No swelling.     Cervical back: Neck supple.  Skin:    General: Skin is warm and dry.     Capillary Refill: Capillary refill takes less than 2 seconds.  Neurological:     Mental Status: She is alert.  Psychiatric:        Mood and Affect: Mood normal.    ED Results / Procedures / Treatments   Labs (all labs ordered are listed, but only abnormal results are displayed) Labs Reviewed - No data to display  EKG None  Radiology No results found.  Procedures Procedures   Medications Ordered in ED Medications - No data to display  ED Course  I have reviewed the triage vital signs and the nursing notes.  Pertinent labs & imaging results that were available during my care of the patient were reviewed by me and considered in my medical decision making (see chart for details).    MDM Rules/Calculators/A&P                          Kiara Brewer is a 20 year old female with history of reflux, irritable bowel syndrome, fibromyalgia, complex regional pain syndrome, who presents the ED with intermittent nausea, vomiting, diarrhea.  Ongoing for the last 6 days.  Was seen yesterday at urgent care and had negative flu and COVID test.  She is not had any fevers.  No abdominal pain.  Zofran has helped with the symptoms.  She still having some intermittent nausea symptoms  despite the Zofran however.  She is able to eat and drink without any issues but every time she goes to the bathroom she is having loose stools.  Overall suspect that this could be some sort of irritable bowel syndrome.  She has no focal abdominal tenderness.  No fever.  No concern for appendicitis or other intra-abdominal process.  She is able to tolerate p.o.  She does have a  prescription for Phenergan that I recommend that she use as well if needed.  She has had work-up with GI in the past including colonoscopy.  Overall if diarrhea is persisting longer than 2 weeks recommend that she follow-up with her primary care doctor for stool sample.  Recommend they be follow-up with GI as well.  Overall she appears well and no concern for dehydration.  Discharged in good condition.  This chart was dictated using voice recognition software.  Despite best efforts to proofread,  errors can occur which can change the documentation meaning.       Final Clinical Impression(s) / ED Diagnoses Final diagnoses:  Diarrhea, unspecified type    Rx / DC Orders ED Discharge Orders     None        Lennice Sites, DO 10/11/21 1834

## 2021-10-11 NOTE — Discharge Instructions (Signed)
Recommend continued use of your Zofran.  It is okay to take doses of Phenergan as well if needed.  Follow-up with your primary care doctor if diarrhea is persisting longer than 2 weeks.  Please return if you develop severe abdominal pain, fever.

## 2021-10-11 NOTE — ED Triage Notes (Signed)
Pt having N/V/D x 1 week  NAD

## 2021-10-15 NOTE — Telephone Encounter (Signed)
F/u   Received paper fax from [facility]  for prior authorization   [facility]    Address [address] [place], [place] [address]  In epic system last name is Gambrel .   Medication Aimovig 140 mg   Required MD signature , Dr. Everlena Cooper is out of the office this week on vacation.

## 2021-10-22 NOTE — Telephone Encounter (Signed)
F/u   Fax information with MD signature back to  [phone].  [facility]  Medication Aimovig 140 mg

## 2021-10-22 NOTE — Telephone Encounter (Signed)
F/u   Received fax from [facility]   Medication Aimovig 140 mg   Effective 11.23.2022 through 12.23.2023

## 2021-11-15 ENCOUNTER — Encounter: Payer: Self-pay | Admitting: Physical Medicine and Rehabilitation

## 2021-11-15 ENCOUNTER — Encounter: Payer: 59 | Attending: Physical Medicine and Rehabilitation | Admitting: Physical Medicine and Rehabilitation

## 2021-11-15 ENCOUNTER — Other Ambulatory Visit: Payer: Self-pay

## 2021-11-15 ENCOUNTER — Ambulatory Visit: Payer: Self-pay | Admitting: Neurology

## 2021-11-15 VITALS — BP 121/72 | HR 89 | Temp 97.9°F | Ht 67.0 in | Wt 151.0 lb

## 2021-11-15 DIAGNOSIS — M357 Hypermobility syndrome: Secondary | ICD-10-CM | POA: Diagnosis not present

## 2021-11-15 DIAGNOSIS — G894 Chronic pain syndrome: Secondary | ICD-10-CM | POA: Diagnosis not present

## 2021-11-15 DIAGNOSIS — M7918 Myalgia, other site: Secondary | ICD-10-CM

## 2021-11-15 DIAGNOSIS — M797 Fibromyalgia: Secondary | ICD-10-CM | POA: Insufficient documentation

## 2021-11-15 MED ORDER — DULOXETINE HCL 60 MG PO CPEP: ORAL_CAPSULE | ORAL | 3 refills | Status: DC

## 2021-11-15 NOTE — Patient Instructions (Signed)
Plan: If going to use theracane, hold pressure for 2 minutes- explained the first 30 seconds, the muscles are tight and tighten up more- so need to hold 2+ minutes.   2. Patient here for trigger point injections for  Consent done and on chart.  Cleaned areas with alcohol and injected using a 27 gauge 1.5 inch needle  Injected 2.5cc Using 1% Lidocaine with no EPI  Upper traps Levators Posterior scalenes Middle scalenes Splenius Capitus Pectoralis Major Rhomboids Infraspinatus Teres Major/minor Thoracic paraspinals Lumbar paraspinals- B/L  x2 total 4 injections Other injections-    Patient's level of pain prior was 6/10 Current level of pain after injections is 6/10  There was no bleeding or complications.  Patient was advised to drink a lot of water on day after injections to flush system Will have increased soreness for 12-48 hours after injections.  Can use Lidocaine patches the day AFTER injections Can use theracane on day of injections in places didn't inject Can use heating pad 4-6 hours AFTER injections   3. Medical dog at next appt-    4. Has enough phenergan - had 5 refills last month.   5. If legs jump, takes Zanaflex-   6. Needs refill on Cymbalta/Duloxetine 60 mg daily. - sent in 1 year supply.   7. F/U in 3 months- trigger point injections.

## 2021-11-15 NOTE — Progress Notes (Signed)
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 Also has chronic pain syndrome- could also be due to fibromyalgia/Myofascial pain syndrome   Here for f/u on fibromyalgia, Myofascial pain, and hypermobility.   Nurtec didn't work her her- imitrex didn't work after the first few weeks - Migraines more consistent and worse-  Amovig has helped a ton! Now- has only had 1 migraine since starting - started in early-mid December-  Sam and she give her the injection.   Got a theracane- didn't like it- hurts- so hasn't really used it.  Used for 30 seconds at a time-   Got assigned medical dog- being trained and get in march 2023.  Possibly going to go to [facility] or [place] for heart issues.   Needs socks to sleep because legs will jump if cold.    Plan: If going to use theracane, hold pressure for 2 minutes- explained the first 30 seconds, the muscles are tight and tighten up more- so need to hold 2+ minutes.   2. Patient here for trigger point injections for  Consent done and on chart.  Cleaned areas with alcohol and injected using a 27 gauge 1.5 inch needle  Injected 2.5cc Using 1% Lidocaine with no EPI  Upper traps Levators Posterior scalenes Middle scalenes Splenius Capitus Pectoralis Major Rhomboids Infraspinatus Teres Major/minor Thoracic paraspinals Lumbar paraspinals- B/L  x2 total 4 injections Other injections-    Patient's level of pain prior was 6/10 Current level of pain after injections is 6/10  There was no bleeding or complications.  Patient was advised to drink a lot of water on day after injections to flush system Will have increased soreness for 12-48 hours after injections.  Can use Lidocaine patches the day AFTER injections Can use theracane on day of injections in places didn't inject Can use heating pad 4-6 hours AFTER injections   3. Medical dog at next appt-    4. Has enough  phenergan - had 5 refills last month.   5. If legs jump, takes Zanaflex-   6. Needs refill on Cymbalta/Duloxetine 60 mg daily. - sent in 1 year supply.   7. F/U in 3 months- trigger point injections.

## 2021-12-05 ENCOUNTER — Encounter: Payer: Self-pay | Admitting: Neurology

## 2021-12-12 ENCOUNTER — Other Ambulatory Visit: Payer: Self-pay | Admitting: Physician Assistant

## 2021-12-13 ENCOUNTER — Other Ambulatory Visit: Payer: Self-pay

## 2021-12-13 MED ORDER — PREDNISONE 10 MG (21) PO TBPK: ORAL_TABLET | ORAL | 0 refills | Status: DC

## 2021-12-21 ENCOUNTER — Other Ambulatory Visit: Payer: Self-pay | Admitting: Physical Medicine and Rehabilitation

## 2021-12-23 ENCOUNTER — Encounter: Payer: Self-pay | Admitting: Internal Medicine

## 2021-12-26 ENCOUNTER — Other Ambulatory Visit: Payer: Self-pay | Admitting: Physician Assistant

## 2022-01-03 ENCOUNTER — Encounter: Payer: Self-pay | Admitting: Neurology

## 2022-01-06 ENCOUNTER — Other Ambulatory Visit: Payer: Self-pay

## 2022-01-06 DIAGNOSIS — G43009 Migraine without aura, not intractable, without status migrainosus: Secondary | ICD-10-CM

## 2022-01-06 MED ORDER — EMGALITY 120 MG/ML ~~LOC~~ SOAJ: 120.0000 mg | SUBCUTANEOUS | 5 refills | Status: DC

## 2022-01-06 MED ORDER — EMGALITY 120 MG/ML ~~LOC~~ SOAJ: 240.0000 mg | Freq: Once | SUBCUTANEOUS | 0 refills | Status: AC

## 2022-01-06 NOTE — Progress Notes (Signed)
Per Dr.Jaffe,We can try switching to [facility] - first dose is a loading dose requiring 2 injections, then 1 injection every 28 days thereafter.  She will need to wait until her next scheduled injection to start. ? ?Pt agrees to switch.  ?

## 2022-01-17 ENCOUNTER — Other Ambulatory Visit: Payer: Self-pay | Admitting: Physician Assistant

## 2022-01-22 ENCOUNTER — Other Ambulatory Visit: Payer: Self-pay | Admitting: Neurology

## 2022-01-27 ENCOUNTER — Telehealth: Payer: Self-pay | Admitting: Neurology

## 2022-01-27 DIAGNOSIS — G43009 Migraine without aura, not intractable, without status migrainosus: Secondary | ICD-10-CM

## 2022-01-27 NOTE — Telephone Encounter (Signed)
Patient said she just off phone with [facility], they told her to call the office regarding her emgality shot. She wasn't sure what exactly needed to be done ?

## 2022-01-28 MED ORDER — EMGALITY 120 MG/ML ~~LOC~~ SOAJ: 240.0000 mg | Freq: Once | SUBCUTANEOUS | 0 refills | Status: DC

## 2022-01-28 MED ORDER — EMGALITY 120 MG/ML ~~LOC~~ SOAJ: 120.0000 mg | SUBCUTANEOUS | 5 refills | Status: DC

## 2022-01-28 NOTE — Telephone Encounter (Signed)
Emgality sent to the [facility].  ?

## 2022-01-30 MED ORDER — EMGALITY 120 MG/ML ~~LOC~~ SOAJ: 240.0000 mg | Freq: Once | SUBCUTANEOUS | 0 refills | Status: AC

## 2022-01-30 MED ORDER — EMGALITY 120 MG/ML ~~LOC~~ SOAJ: 120.0000 mg | SUBCUTANEOUS | 5 refills | Status: DC

## 2022-01-30 NOTE — Addendum Note (Signed)
Addended by: Leida Lauth on: 01/30/2022 03:54 PM ? ? Modules accepted: Orders ? ?

## 2022-01-30 NOTE — Telephone Encounter (Signed)
Telephone call from patient, Patient tried to pick up her Emgaltity and was advised by the pharmacy that the script never came.  ? ?Resent script, [facility]oke to [facility] to make sure script received. ? ?Per [facility].  ?

## 2022-01-31 ENCOUNTER — Encounter: Payer: Self-pay | Admitting: Neurology

## 2022-02-03 ENCOUNTER — Other Ambulatory Visit (HOSPITAL_COMMUNITY): Payer: Self-pay

## 2022-02-03 ENCOUNTER — Telehealth: Payer: Self-pay | Admitting: Neurology

## 2022-02-03 ENCOUNTER — Telehealth (HOSPITAL_COMMUNITY): Payer: Self-pay | Admitting: Pharmacy Technician

## 2022-02-03 NOTE — Telephone Encounter (Signed)
Patient's mom Kiara Brewer called and said an Emgality PA is needed. Please call: [phone] ? ?[facility] ? ?ID [id number], Kiara Brewer is the name they have on file. ? ?Patient has been without the medication. ? ?

## 2022-02-03 NOTE — Telephone Encounter (Signed)
Telephone call to patient mother, Mychart message sent as well as advised patient by phone please stop by and get a sample of Emgaltiy. ?Not sure why the PA need additional questions answered. ? ?Message sent to the PA team to call and go over question.  ?

## 2022-02-03 NOTE — Telephone Encounter (Signed)
Patient Advocate Encounter ? ?Prior Authorization for [employer] 120MG /ML auto-injectors  has been approved.   ? ?PA# [id number] ?Effective dates: 02/03/2022 through 02/03/2023 ? ? ? ? ? ?Lyndel Safe, CPhT ?Pharmacy Patient Advocate Specialist ?[place] [facility] Patient Advocate Team ?Direct Number: [phone]  Fax: [phone]  ?

## 2022-02-03 NOTE — Telephone Encounter (Signed)
LMOVM, ?Per PA team, ?Josepha Pigg, CPhT  Leida Lauth, CMA; P Rx Prior Auth Team ?Yes it did come to use and Prior Authorization for Emgality 120MG /ML auto-injectors  has been approved.    ?   ?PA#  ?Effective dates: 02/03/2022 through 02/03/2023. ? ? ? ?

## 2022-02-06 ENCOUNTER — Encounter: Payer: 59 | Admitting: Family

## 2022-02-13 ENCOUNTER — Encounter: Payer: Self-pay | Admitting: Internal Medicine

## 2022-02-13 ENCOUNTER — Ambulatory Visit (INDEPENDENT_AMBULATORY_CARE_PROVIDER_SITE_OTHER): Payer: 59 | Admitting: Internal Medicine

## 2022-02-13 VITALS — HR 112 | Ht 67.0 in | Wt 150.0 lb

## 2022-02-13 DIAGNOSIS — I951 Orthostatic hypotension: Secondary | ICD-10-CM | POA: Diagnosis not present

## 2022-02-13 DIAGNOSIS — G901 Familial dysautonomia [Riley-Day]: Secondary | ICD-10-CM | POA: Diagnosis not present

## 2022-02-13 NOTE — Patient Instructions (Signed)
Medication Instructions:  ?Your physician recommends that you continue on your current medications as directed. Please refer to the Current Medication list given to you today. ? ?*If you need a refill on your cardiac medications before your next appointment, please call your pharmacy* ? ? ?Lab Work: ?None ordered. ? ?If you have labs (blood work) drawn today and your tests are completely normal, you will receive your results only by: ?MyChart Message (if you have MyChart) OR ?A paper copy in the mail ?If you have any lab test that is abnormal or we need to change your treatment, we will call you to review the results. ? ? ?Testing/Procedures: ?None ordered. ? ? ? ?Follow-Up: ?At [facility], you and your health needs are our priority.  As part of our continuing mission to provide you with exceptional heart care, we have created designated Provider Care Teams.  These Care Teams include your primary Cardiologist (physician) and Advanced Practice Providers (APPs -  Physician Assistants and Nurse Practitioners) who all work together to provide you with the care you need, when you need it. ? ?We recommend signing up for the patient portal called "MyChart".  Sign up information is provided on this After Visit Summary.  MyChart is used to connect with patients for Virtual Visits (Telemedicine).  Patients are able to view lab/test results, encounter notes, upcoming appointments, etc.  Non-urgent messages can be sent to your provider as well.   ?To learn more about what you can do with MyChart, go to [email or web address].   ? ?Your next appointment:   ?Follow up to be determined. ? ?Important Information About Sugar ? ? ? ? ?  ?

## 2022-02-13 NOTE — Progress Notes (Signed)
?  ? ?Electrophysiology TeleHealth Note ? ? ?Due to national recommendations of social distancing due to COVID 19, an audio/video telehealth visit is felt to be most appropriate for this patient at this time.  See MyChart message from today for the patient's consent to telehealth for [facility]. ? ? ?Date:  02/13/2022  ? ?ID:  [name], DOB March 29, 2001, MRN [id number]  Location: patient's home ? ?Provider location: [address], [place] ? ?Evaluation Performed: Follow-up visit ? ?PCP:  No primary care provider on file.  ?Cardiologist:     ?Electrophysiologist:  SK  ? ?Chief Complaint:  syncope ? ?History of Present Illness:   ? ?Kiara Brewer is a 21 y.o. female who presents via audio/video conferencing for a telehealth visit today.  Since last being seen in our clinic for syncope and symptoms suggestive of dysautonomia the patient reports recurrent syncope, up to 3 times per day.  She has fainted while exercising.  Her husband reports that her eyes were closed during these events. ?Moreover, most of these events are not associated with her falling but rather with her squatting and becoming alert while she is still squatted as it were or sitting up. ? ?Bisoprolol has been assoc with slower HR and less fainting>> ?  ?Review of her chart identifies vital signs consistent with POTS 11/21 but not since ? ?Event recorder 11/21 was reviewed again.  Triggered events were associate with sinus rhythm mostly with heart rates less than 100 but notable on May the tracings was a significant variation in heart rate from 70s--90s ? ?The patient denies symptoms of fevers, chills, cough, or new SOB worrisome for COVID 19.   ? ?Past Medical History:  ?Diagnosis Date  ? Anal pain   ? Anxiety   ? Complex regional pain syndrome of both lower extremities   ? per pt w/ right thigh numbness  ? Depression   ? Exercise-induced asthma   ? Fainting episodes   ? Fibromyalgia   ? GERD (gastroesophageal reflux disease)   ?  History of concussion 04/2019  ? per pt no residual  ? History of Osgood-Schlatter disease   ? History of syncope   ? ED visit 08-29-2020 w/ cp, palpitations--- pt sent for cardiology evaluation , dr Lalla Brothers, office note 09-07-2020 dx orthostatic intolerence ;   echo in epic 09-14-2020 , normal;  event monitor 09-13-2020 no signfiance arrhythmia , SR/ST;  CT angio chest 09-02-2020  normal  ? Irritable bowel   ? Migraine without aura   ? Orthostatic hypotension   ? Sacral radiculopathy   ? Spondylolisthesis, lumbosacral region   ? L5--S1  ? Wears contact lenses   ? ? ?Past Surgical History:  ?Procedure Laterality Date  ? RECTAL EXAM UNDER ANESTHESIA N/A 11/22/2020  ? Procedure: ANAL  EXAM UNDER ANESTHESIA;  Surgeon: Romie Levee, MD;  Location: [facility];  Service: General;  Laterality: N/A;  ? SPHINCTEROTOMY N/A 11/22/2020  ? Procedure: CHEMICAL SPHINCTEROTOMY/ FISSURECTOMY;  Surgeon: Romie Levee, MD;  Location: [facility];  Service: General;  Laterality: N/A;  ? TONSILLECTOMY AND ADENOIDECTOMY  2010  ? WISDOM TOOTH EXTRACTION  2020  ? ? ?Current Outpatient Medications  ?Medication Sig Dispense Refill  ? albuterol (PROVENTIL HFA;VENTOLIN HFA) 108 (90 Base) MCG/ACT inhaler Inhale 2 puffs into the lungs every 6 (six) hours as needed for wheezing or shortness of breath. 1 Inhaler 3  ? bisoprolol (ZEBETA) 5 MG tablet Take 1 tablet (5 mg total) by mouth  daily. 90 tablet 3  ? buPROPion (WELLBUTRIN XL) 300 MG 24 hr tablet TAKE 1 TABLET BY MOUTH EVERY DAY 90 tablet 0  ? calcium carbonate (TUMS - DOSED IN MG ELEMENTAL CALCIUM) 500 MG chewable tablet Chew 1 tablet by mouth as needed for indigestion or heartburn.    ? DULoxetine (CYMBALTA) 60 MG capsule TAKE 1 CAPSULE BY MOUTH EVERY DAY 90 capsule 1  ? EPINEPHrine 0.3 mg/0.3 mL IJ SOAJ injection Inject 0.3 mg into the muscle daily as needed for anaphylaxis.    ? Galcanezumab-gnlm (EMGALITY) 120 MG/ML SOAJ Inject 120 mg into the skin every 30  (thirty) days. 1.12 mL 5  ? montelukast (SINGULAIR) 10 MG tablet TAKE 1 TABLET BY MOUTH EVERY DAY 90 tablet 0  ? Multiple Vitamin (MULTIVITAMIN) tablet Take 1 tablet by mouth daily.    ? norethindrone-ethinyl estradiol (JUNEL 1/20) 1-20 MG-MCG tablet TAKE 1 TABLET BY MOUTH DAILY. TAKE CONTINUOUSLY, SKIP PLACEBO WEEK 112 tablet 2  ? promethazine (PHENERGAN) 12.5 MG tablet Take 1 tablet (12.5 mg total) by mouth every 6 (six) hours as needed for nausea or vomiting. 90 tablet 5  ? rizatriptan (MAXALT-MLT) 10 MG disintegrating tablet Take 1 tablet earliest onset of migraine.  May repeat in 2 hours if needed.  Maximum 2 tablets in 24 hours. 10 tablet 5  ? tiZANidine (ZANAFLEX) 4 MG tablet TAKE 0.5-1 TABLETS (2-4 MG TOTAL) BY MOUTH 2 (TWO) TIMES DAILY AS NEEDED FOR MUSCLE SPASMS. 180 tablet 1  ? ?Current Facility-Administered Medications  ?Medication Dose Route Frequency Provider Last Rate Last Admin  ? 0.9 %  sodium chloride infusion  500 mL Intravenous Once Armbruster, Willaim RayasSteven P, MD      ? ? ?Allergies:   Pecan extract allergy skin test, Strawberry extract, Other, Gluten meal, and Shellfish allergy  ? ?Social History:  The patient  reports that she has never smoked. She has never used smokeless tobacco. She reports that she does not drink alcohol and does not use drugs.  ? ?Family History:  The patient's   family history includes Alcohol abuse in her paternal grandfather; Anxiety disorder in her brother and brother; Arthritis in her maternal grandmother and mother; Asthma in her brother, brother, and mother; COPD in her father and paternal grandfather; Cancer in her maternal grandmother, paternal grandfather, and paternal grandmother; Colon polyps in her maternal grandfather; Diabetes in her maternal grandfather; Heart attack in her maternal grandfather and paternal grandfather; Heart disease in her maternal grandfather and paternal grandfather; Hyperlipidemia in her maternal grandfather and paternal grandfather;  Hypertension in her father, maternal grandfather, and paternal grandfather; Migraines in her brother, father, and mother; Miscarriages / Stillbirths in her maternal grandmother and mother; OCD in her brother; Stroke in her maternal grandfather.  ? ?ROS:  Please see the history of present illness.   All other systems are personally reviewed and negative.  ? ? ?Exam:   ? ?Vital Signs:  Pulse (!) 112   Ht 5\' 7"  (1.702 m)   Wt 150 lb (68 kg)   BMI 23.49 kg/m?    ? ?  ? ? ?Labs/Other Tests and Data Reviewed:   ? ?Recent Labs: ?No results found for requested labs within last 8760 hours.  ? ?Wt Readings from Last 3 Encounters:  ?02/13/22 150 lb (68 kg) (80 %, Z= 0.84)*  ?11/15/21 151 lb (68.5 kg) (81 %, Z= 0.88)*  ?10/11/21 161 lb 13.1 oz (73.4 kg) (88 %, Z= 1.19)*  ? ?* Growth percentiles are based on CDC (  Girls, 2-20 Years) data.  ?  ? ?Other studies personally reviewed: ?Additional studies/ records that were reviewed today include:   ? ? ? ?ASSESSMENT & PLAN:   ? ?Recurrent syncope ?  ?Orthostatic intolerance ?  ?Complex regional pain ?  ?Fibromyalgia ?  ?Anxiety/depression ? ?Recurrent events are curious to me in that if indeed her eyes are closed, that would strongly suggest noncardiovascular or neurological syncope, but rather pseudo syncope.  Consistent with this hypothesis is the fact that her loss of postural tone is incomplete. ? ?Is her impression that her bisoprolol has been advantageous.  We will increase it from 5--7.5 mg twice daily. ? ?Asked her to try to do orthostatic vital signs for [place] at home. ? ? ? ?COVID 19 screen ?The patient denies symptoms of COVID 19 at this time.  The importance of social distancing was discussed today. ? ?Follow-up:  THV 6 weeks  ? ?Current medicines are reviewed at length with the patient today.   ?The patient does not have concerns regarding her medicines.  The following changes were made today:  bisoprolol increase 5 >> 7.5  ? ?Labs/ tests ordered today include: event  recorder   ? ?No orders of the defined types were placed in this encounter. ? ?Letter to be able to sit down during examination  ?Future tests ( post COVID )  ? in   months ? ?Patient Risk:  after full review of this

## 2022-02-21 ENCOUNTER — Encounter: Payer: Self-pay | Admitting: Physical Medicine and Rehabilitation

## 2022-02-21 ENCOUNTER — Encounter: Payer: 59 | Attending: Physical Medicine and Rehabilitation | Admitting: Physical Medicine and Rehabilitation

## 2022-02-21 VITALS — BP 107/72 | HR 78 | Temp 97.9°F | Ht 67.0 in | Wt 164.0 lb

## 2022-02-21 DIAGNOSIS — G894 Chronic pain syndrome: Secondary | ICD-10-CM | POA: Diagnosis not present

## 2022-02-21 DIAGNOSIS — M797 Fibromyalgia: Secondary | ICD-10-CM | POA: Insufficient documentation

## 2022-02-21 DIAGNOSIS — M7918 Myalgia, other site: Secondary | ICD-10-CM | POA: Insufficient documentation

## 2022-02-21 DIAGNOSIS — Z79899 Other long term (current) drug therapy: Secondary | ICD-10-CM | POA: Insufficient documentation

## 2022-02-21 DIAGNOSIS — R55 Syncope and collapse: Secondary | ICD-10-CM | POA: Diagnosis present

## 2022-02-21 DIAGNOSIS — M357 Hypermobility syndrome: Secondary | ICD-10-CM

## 2022-02-21 DIAGNOSIS — G43019 Migraine without aura, intractable, without status migrainosus: Secondary | ICD-10-CM

## 2022-02-21 DIAGNOSIS — G43919 Migraine, unspecified, intractable, without status migrainosus: Secondary | ICD-10-CM | POA: Diagnosis not present

## 2022-02-21 MED ORDER — DULOXETINE HCL 60 MG PO CPEP: 120.0000 mg | ORAL_CAPSULE | Freq: Every day | ORAL | 1 refills | Status: DC

## 2022-02-21 NOTE — Patient Instructions (Signed)
Plan: ?1.  Patient here for trigger point injections for ? Consent done and on chart. ? ?Cleaned areas with alcohol and injected using a 27 gauge 1.5 inch needle ? ?Injected 2cc ?Using 1% Lidocaine with no EPI ? ?Upper traps ?Levators ?Posterior scalenes ?Middle scalenes ?Splenius Capitus ?Pectoralis Major ?Rhomboids ?Infraspinatus ?Teres Major/minor ?Thoracic paraspinals ?Lumbar paraspinals B/L x2- so total of 4 injections ?Other injections-  ? ? ?Patient's level of pain prior was  3/10 ?Current level of pain after injections is- 6/10- more with injections. Usually hurts more for 1-2 days and then feels better ? ?There was no bleeding or complications. ? ?Patient was advised to drink a lot of water on day after injections to flush system ?Will have increased soreness for 12-48 hours after injections.  ?Can use Lidocaine patches the day AFTER injections ?Can use theracane on day of injections in places didn't inject ?Can use heating pad 4-6 hours AFTER injections  ? ? ? 2. Just switched to new PCP- so hasn't been checked for RA- suggest RA work up by PCP- and see if needs referral to Dr Lillia Carmel.   ? ?3. Con't Duloxetine-takes 60 mg daily but on bad days takes 2 tabs-  so 120 mg daily- with 3 months supply and 1 refill.  ? ?4. Con't Emgality- /Maxalt for Migraines- per Neuro.  ? ? ?5. Increased Wellbutrin 450 mg daily and and Zebeta 7.5 mg daily now.- per Cards/PCP.  ? ?6. Gets Phenergan from Dr Tomi Likens as well.  ? ? ?7. F/U in 3 months- trigger point injections.  ?

## 2022-02-21 NOTE — Progress Notes (Signed)
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 Also has chronic pain syndrome- could also be due to fibromyalgia/Myofascial pain syndrome  ? ?Here for f/u on fibromyalgia, Myofascial pain, and hypermobility. Also has migraines- intractable.  ? ? ?Ready for injections.  ?Hurting pretty bad.  ?Mainly low back and hips ? ?Was put on different migraine injection- ? ?Was put on Emgality- didn't get 2 injections for first time- got 1st injection- coming close for next one.  ?Took 4/10.  ? ?Getting medical dog next week .  ? ?Been noticing joints in wrists fingers and ankles are aching more.  ? ?Has been taking Zanaflex at night-  4 mg QHS 1x every other week.  ?Is helpful- only takes when legs are super jumpy.  ? ? ? ?Plan: ?1.  Patient here for trigger point injections for ? Consent done and on chart. ? ?Cleaned areas with alcohol and injected using a 27 gauge 1.5 inch needle ? ?Injected 2cc ?Using 1% Lidocaine with no EPI ? ?Upper traps ?Levators ?Posterior scalenes ?Middle scalenes ?Splenius Capitus ?Pectoralis Major ?Rhomboids ?Infraspinatus ?Teres Major/minor ?Thoracic paraspinals ?Lumbar paraspinals B/L x2- so total of 4 injections ?Other injections-  ? ? ?Patient's level of pain prior was  3/10 ?Current level of pain after injections is- 6/10- more with injections. Usually hurts more for 1-2 days and then feels better ? ?There was no bleeding or complications. ? ?Patient was advised to drink a lot of water on day after injections to flush system ?Will have increased soreness for 12-48 hours after injections.  ?Can use Lidocaine patches the day AFTER injections ?Can use theracane on day of injections in places didn't inject ?Can use heating pad 4-6 hours AFTER injections  ? ? ? 2. Just switched to new PCP- so hasn't been checked for RA- suggest RA work up by PCP- and see if needs referral to Dr Nigel Sloop.   ? ?3. Con't Duloxetine-takes 60 mg  daily but on bad days takes 2 tabs-  so 120 mg daily- with 3 months supply and 1 refill.  ? ?4. Con't Emgality- /Maxalt for Migraines- per Neuro.  ? ? ?5. Increased Wellbutrin 450 mg daily and and Zebeta 7.5 mg daily now.- per Cards/PCP.  ? ?6. Gets Phenergan from Dr Everlena Cooper as well.  ? ? ?7. F/U in 3 months- trigger point injections.  ?

## 2022-03-20 ENCOUNTER — Encounter: Payer: Self-pay | Admitting: Internal Medicine

## 2022-03-20 ENCOUNTER — Ambulatory Visit (INDEPENDENT_AMBULATORY_CARE_PROVIDER_SITE_OTHER): Payer: 59

## 2022-03-20 DIAGNOSIS — G901 Familial dysautonomia [Riley-Day]: Secondary | ICD-10-CM

## 2022-03-20 DIAGNOSIS — R55 Syncope and collapse: Secondary | ICD-10-CM

## 2022-03-20 MED ORDER — BISOPROLOL FUMARATE 5 MG PO TABS: 7.5000 mg | ORAL_TABLET | Freq: Every day | ORAL | 2 refills | Status: DC

## 2022-03-20 NOTE — Progress Notes (Unsigned)
Enrolled for [employer] to mail a ZIO AT Live Telemetry monitor to patients address on file.  

## 2022-03-25 DIAGNOSIS — G901 Familial dysautonomia [Riley-Day]: Secondary | ICD-10-CM

## 2022-03-25 DIAGNOSIS — R55 Syncope and collapse: Secondary | ICD-10-CM | POA: Diagnosis not present

## 2022-03-27 ENCOUNTER — Encounter: Payer: Self-pay | Admitting: Obstetrics and Gynecology

## 2022-04-01 ENCOUNTER — Ambulatory Visit (INDEPENDENT_AMBULATORY_CARE_PROVIDER_SITE_OTHER): Payer: 59 | Admitting: Obstetrics and Gynecology

## 2022-04-01 ENCOUNTER — Encounter: Payer: Self-pay | Admitting: Obstetrics and Gynecology

## 2022-04-01 VITALS — BP 128/84 | HR 91

## 2022-04-01 DIAGNOSIS — R102 Pelvic and perineal pain: Secondary | ICD-10-CM | POA: Diagnosis not present

## 2022-04-01 DIAGNOSIS — R61 Generalized hyperhidrosis: Secondary | ICD-10-CM

## 2022-04-01 DIAGNOSIS — M797 Fibromyalgia: Secondary | ICD-10-CM

## 2022-04-01 DIAGNOSIS — I951 Orthostatic hypotension: Secondary | ICD-10-CM

## 2022-04-01 DIAGNOSIS — G90523 Complex regional pain syndrome I of lower limb, bilateral: Secondary | ICD-10-CM

## 2022-04-01 DIAGNOSIS — G901 Familial dysautonomia [Riley-Day]: Secondary | ICD-10-CM | POA: Diagnosis not present

## 2022-04-01 NOTE — Progress Notes (Signed)
GYNECOLOGY  VISIT   HPI: 21 y.o.  Married White or Caucasian Not Hispanic or Latino  female   [id number] with No LMP recorded. (Menstrual status: Oral contraceptives).   here for excessive perspiration and cold sweats at night even when at rest for 2 months. No fevers, no weight loss. The sweating is generalized.   Also reports generalized intermittent lower abdominal cramping intermittently for a few months. Comes and goes throughout the day. Can last a second or a few minutes, occurs in BLQ, low down.  Normal BM qd.  Normal bladder function.  Sexually active, same partner x 2-3 years. Got married in 7/22. She has intermittent deep dyspareunia. Not positional.   Pt also reports not having cycle since being a Jr in high school and is worried about fertility, since its been at least 3 years.  Has a service dog for her POTS  03/26/22 Labs: WBC was 5.2 Hgb 12.5 Creat 0.88 ALT 32, AST 24 Glucose 102 (not fasting) TSH 1.75 Free T4  1.06   GYNECOLOGIC HISTORY: No LMP recorded. (Menstrual status: Oral contraceptives). 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   Dysautonomia ([place]) 06/03/2021   Orthostatic intolerance 06/03/2021   Myofascial pain 10/08/2020   Depression, recurrent ([place]) 09/11/2020   Syncope 09/07/2020   Fibromyalgia    Migraine without aura    Vasovagal syncope 11/23/2019   Complex regional pain syndrome i of lower limb, bilateral 01/11/2019   Right leg numbness 08/31/2018   New onset headache 08/31/2018   Exercise-induced asthma 08/13/2018   Osgood-Schlatter's disease 08/13/2018   Complex regional pain syndrome of lower extremity 08/13/2018   Sacral radiculopathy 08/02/2018    Spondylolisthesis at L5-S1 level 08/02/2018    Past Medical History:  Diagnosis Date   Anal pain    Anxiety    Complex regional pain syndrome of both lower extremities    per pt w/ right thigh numbness   Depression    Exercise-induced asthma    Fainting episodes    Fibromyalgia    GERD (gastroesophageal reflux disease)    History of concussion 04/2019   per pt no residual   History of Osgood-Schlatter disease    History of syncope    ED visit 08-29-2020 w/ cp, palpitations--- pt sent for cardiology evaluation , dr Quentin Ore, office note 09-07-2020 dx orthostatic intolerence ;   echo in epic 09-14-2020 , normal;  event monitor 09-13-2020 no signfiance arrhythmia , SR/ST;  CT angio chest 09-02-2020  normal   Irritable bowel    Migraine without aura    Orthostatic hypotension    Sacral radiculopathy    Spondylolisthesis, lumbosacral region    L5--S1   Wears contact lenses     Past Surgical History:  Procedure Laterality Date   RECTAL EXAM UNDER ANESTHESIA N/A 11/22/2020   Procedure: ANAL  EXAM UNDER ANESTHESIA;  Surgeon: Leighton Ruff, MD;  Location: [facility];  Service: General;  Laterality:  N/A;   SPHINCTEROTOMY N/A 11/22/2020   Procedure: CHEMICAL SPHINCTEROTOMY/ FISSURECTOMY;  Surgeon: Leighton Ruff, MD;  Location: [facility];  Service: General;  Laterality: N/A;   TONSILLECTOMY AND ADENOIDECTOMY  2010   WISDOM TOOTH EXTRACTION  2020    Current Outpatient Medications  Medication Sig Dispense Refill   AIMOVIG 140 MG/ML SOAJ SMARTSIG:140 Milligram(s) SUB-Q Every 4 Weeks     albuterol (PROVENTIL HFA;VENTOLIN HFA) 108 (90 Base) MCG/ACT inhaler Inhale 2 puffs into the lungs every 6 (six) hours as needed for wheezing or shortness of breath. 1 Inhaler 3   bisoprolol (ZEBETA) 5 MG tablet Take 1.5 tablets (7.5 mg total) by mouth daily. 135 tablet 2   buPROPion (WELLBUTRIN XL) 300 MG 24 hr tablet TAKE 1 TABLET BY MOUTH EVERY DAY 90 tablet 0    calcium carbonate (TUMS - DOSED IN MG ELEMENTAL CALCIUM) 500 MG chewable tablet Chew 1 tablet by mouth as needed for indigestion or heartburn.     DULoxetine (CYMBALTA) 60 MG capsule Take 2 capsules (120 mg total) by mouth daily. 180 capsule 1   norethindrone-ethinyl estradiol (JUNEL 1/20) 1-20 MG-MCG tablet TAKE 1 TABLET BY MOUTH DAILY. TAKE CONTINUOUSLY, SKIP PLACEBO WEEK 112 tablet 2   ondansetron (ZOFRAN-ODT) 8 MG disintegrating tablet Take 8 mg by mouth every 8 (eight) hours as needed.     promethazine (PHENERGAN) 12.5 MG tablet Take 1 tablet (12.5 mg total) by mouth every 6 (six) hours as needed for nausea or vomiting. 90 tablet 5   rizatriptan (MAXALT-MLT) 10 MG disintegrating tablet Take 1 tablet earliest onset of migraine.  May repeat in 2 hours if needed.  Maximum 2 tablets in 24 hours. 10 tablet 5   tiZANidine (ZANAFLEX) 4 MG tablet TAKE 0.5-1 TABLETS (2-4 MG TOTAL) BY MOUTH 2 (TWO) TIMES DAILY AS NEEDED FOR MUSCLE SPASMS. 180 tablet 1   Current Facility-Administered Medications  Medication Dose Route Frequency Provider Last Rate Last Admin   0.9 %  sodium chloride infusion  500 mL Intravenous Once Armbruster, Carlota Raspberry, MD         ALLERGIES: Pecan extract allergy skin test, Strawberry extract, Other, Shellfish allergy, and Gluten meal  Family History  Problem Relation Age of Onset   Arthritis Mother    Asthma Mother    Miscarriages / [place] Mother    Migraines Mother    COPD Father        per pt's mother-mild   Hypertension Father    Migraines Father    Asthma Brother    Migraines Brother    Anxiety disorder Brother    Arthritis Maternal Grandmother    Cancer Maternal Grandmother    Miscarriages / Stillbirths Maternal Grandmother    Diabetes Maternal Grandfather    Heart attack Maternal Grandfather    Heart disease Maternal Grandfather    Hyperlipidemia Maternal Grandfather    Hypertension Maternal Grandfather    Stroke Maternal Grandfather    Colon polyps  Maternal Grandfather    Cancer Paternal Grandmother    Alcohol abuse Paternal Grandfather    Cancer Paternal Grandfather    COPD Paternal Grandfather    Heart attack Paternal Grandfather    Heart disease Paternal Grandfather    Hyperlipidemia Paternal Grandfather    Hypertension Paternal Grandfather    Asthma Brother    OCD Brother    Anxiety disorder Brother    Seizures Neg Hx    Depression Neg Hx    Bipolar disorder Neg Hx    Schizophrenia Neg  Hx    ADD / ADHD Neg Hx    Autism Neg Hx    Colon cancer Neg Hx    Esophageal cancer Neg Hx    Rectal cancer Neg Hx    Stomach cancer Neg Hx     Social History   Socioeconomic History   Marital status: Single    Spouse name: Not on file   Number of children: Not on file   Years of education: Not on file   Highest education level: Not on file  Occupational History   Not on file  Tobacco Use   Smoking status: Never   Smokeless tobacco: Never  Vaping Use   Vaping Use: Never used  Substance and Sexual Activity   Alcohol use: Never   Drug use: Never   Sexual activity: Yes    Partners: Male    Birth control/protection: Pill  Other Topics Concern   Not on file  Social History Narrative   [name] is in the 12th grade at [employer]; she does well academically. She lives with her parents. She enjoys going to the gym, boxing, and playing with animals   Right handed   Drinks caffeine   An apartment   Social Determinants of Health   Financial Resource Strain: Not on file  Food Insecurity: Not on file  Transportation Needs: Not on file  Physical Activity: Not on file  Stress: Not on file  Social Connections: Not on file  Intimate Partner Violence: Not on file    Review of Systems  Gastrointestinal:  Positive for abdominal pain.  All other systems reviewed and are negative.  PHYSICAL EXAMINATION:    BP 128/84   Pulse 91   SpO2 98%     General appearance: alert, cooperative and appears stated age Neck: no adenopathy, supple,  symmetrical, trachea midline and thyroid normal to inspection and palpation Abdomen: soft, non-tender; non distended, no masses,  no organomegaly  Pelvic: External genitalia:  no lesions              Urethra:  normal appearing urethra with no masses, tenderness or lesions              Bartholins and Skenes: normal                 Vagina: normal appearing vagina with normal color and discharge, no lesions              Cervix: no cervical motion tenderness and no lesions              Bimanual Exam:  Uterus:  normal size, contour, position, consistency, mobility, non-tender and anteverted              Adnexa:  No masses, tender on the right              Pelvic floor: not tender  Chaperone was present for exam.  1. Pelvic pain Tender in the right adnexa, no masses. No bowel or bladder c/o - US PELVIS TRANSVAGINAL NON-OB (TV ONLY); Future - SURESWAB CT/NG/T. vaginalis  2. Unexplained night sweats Normal CBC, TSH, CMP with primary - HIV Antibody (routine testing w rflx) -I suspect her sweats (which are day and night) are related to her other medical issues (see below). I recommended that she f/u with the providers tat manage her dysautonomia and complex regional pain syndrome.   3. Dysautonomia ([place])  4. Orthostatic intolerance  5. Fibromyalgia  6. Complex regional pain syndrome i of lower limb,  bilateral

## 2022-04-02 LAB — HIV ANTIBODY (ROUTINE TESTING W REFLEX): HIV 1&2 Ab, 4th Generation: NONREACTIVE

## 2022-04-02 LAB — SURESWAB CT/NG/T. VAGINALIS
C. trachomatis RNA, TMA: NOT DETECTED
N. gonorrhoeae RNA, TMA: NOT DETECTED
Trichomonas vaginalis RNA: NOT DETECTED

## 2022-04-03 ENCOUNTER — Encounter: Payer: Self-pay | Admitting: Internal Medicine

## 2022-04-03 ENCOUNTER — Ambulatory Visit (INDEPENDENT_AMBULATORY_CARE_PROVIDER_SITE_OTHER): Payer: 59

## 2022-04-03 ENCOUNTER — Telehealth: Payer: Self-pay | Admitting: Internal Medicine

## 2022-04-03 DIAGNOSIS — R102 Pelvic and perineal pain: Secondary | ICD-10-CM

## 2022-04-03 NOTE — Telephone Encounter (Signed)
John with [employer] calling to inform the patient took off the monitor due to skin irritation.

## 2022-04-03 NOTE — Telephone Encounter (Signed)
Patient applied monitor 03/25/22 and removed 04/03/22.  We should have 8-9 days worth of data.  Will import results and assign for Dr. Graciela Husbands to review as soon as available.

## 2022-04-07 ENCOUNTER — Telehealth: Payer: Self-pay | Admitting: Internal Medicine

## 2022-04-07 NOTE — Telephone Encounter (Signed)
Pt sent this via mychart message to our scheduling pool:    This pt sent a message to see if Dr Graciela Husbands needed her to sch a fu appt to go over monitor results or will she just get a call with those results?    Best number [phone]

## 2022-04-08 ENCOUNTER — Encounter: Payer: Self-pay | Admitting: Obstetrics and Gynecology

## 2022-04-12 ENCOUNTER — Encounter: Payer: Self-pay | Admitting: Neurology

## 2022-04-14 ENCOUNTER — Encounter: Payer: Self-pay | Admitting: Neurology

## 2022-04-16 ENCOUNTER — Other Ambulatory Visit: Payer: Self-pay

## 2022-04-16 MED ORDER — PREDNISONE 10 MG (21) PO TBPK: ORAL_TABLET | ORAL | 0 refills | Status: DC

## 2022-05-02 NOTE — Progress Notes (Unsigned)
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 Migraine rescue:  rizatriptan 31m Promethazine 12.567mfor nausea. Limit use of pain relievers to no more than 2 days out of week to prevent risk of rebound or medication-overuse headache. Keep headache diary Follow up with pain management regarding CRPS. Follow up 7 months.     Subjective:  Kiara Brewer an [age]ear old right-handed female with fibromyalgia and migraine who follows up for migraine     UPDATE: Started Aimovig which was ineffective.  Switched to [employer] April.  Rizatriptan, promethazine 12.56m78mIntensity:  7/10 Duration:  several hours to all day Frequency:  3 to 4 days a week Frequency of abortive medication: sumatriptan one day a week Current NSAIDS/analgesics:  none Current triptans:  rizatriptan 23m69mrrent anti-emetic:  Phenergan 12.56mg 7mrent muscle relaxants:  Tizanidine 2-4mg B19mPRN Current Antihypertensive medications:  bisoprolol Current Antidepressant medications:  Cymbalta 60mg d38m, Wellbutrin XL 300mg da41mCurrent Anticonvulsant medications:  Keppra 1000mg BID54mr neuralgia) Current CGRP inhibitor:  Emgality   Caffeine:  Celcius energy drink Diet: Drinks a lot of water and Gatorade.  Snacks throughout day rather than large meals.   Exercise:  Lift weights 6 times a week. . Sometimes running Depression:  yes; Anxiety:  yes Sleep hygiene:  Falls asleep but trouble staying asleep. Other pain:  Reports worsening right lower leg pain and numbness (complex regional pain syndrome)   HISTORY:  Patient has complex medical history.   When she was in middle school, she  fell on the ice landing on her right leg.  It didn't seem too bad but over the next several days, she developed severe pain.  She started experiencing discoloration.  [facility].  In 2019, she began having numbness and tingling in her feet, mostly the right foot.  She would intermittently have a drop foot, causing her leg to give out.  She had a NCV-EMG of the right lower extremity in December 2019 but she was unable to tolerate it and it was abruptly stopped.  NCV showed prolonged distal motor latency and low motor amplitude of the right peroneal nerve with slowing above and below the fibular head.  Right sural and superficial peroneal nerves were normal.  On needle exam, the right tibialis anterior muscle was normal but she then refused to continue the study.  Lab work included negative workup for autoimmune disease - including ANA, sed rate, CRP, HLA-B27.  B12, folate, TSH, CK and aldolase have been normal.  She is under the care of pain management.   She has had headaches off and on since childhood but she started having more severe and frequent migraines in 2020.  She describes a severe stabbing and pounding pain behind the ears or in the eyes, either side.  There is associated nausea, vomiting, photophobia, phonophobia and osmophobia.  If she is able to take sumatriptan within 15 minutes of onset, then it resolves within an other, otherwise it lasts all day.  They occur 2 to 3 days a week.  She also has a daily dull headache for which she takes Excedrin daily.     Also since 2020,  she has had recurrent episodes of passing out.  She describes feeling hot and lightheaded with heaviness in her legs and then passes out.  It lasts only a few seconds.  No convulsions, incontinence, tongue/cheek biting.  No postictal confusion.  It occurs 2-3 times a day..  Sometimes preceded briefly by sharp severe headache.  It  mostly occurs with change in position, such as bending over, but some are spontaneous.  She has been diagnosed with orthostatic hypotension.  She feels lightheaded when she stands up from sitting.  Unclear if related to polypharmacy.  She has been referred cardiology.  TTE unremarkable with normal EF and no valvular abnormalities.  She was ordered a 14 day ZIO patch but only tolerated 4 days due to skin irritation.  Heart rate demonstrated 43-173bpm with average 93 bpm.  Rare PVCs and PACs.  Events corresponded to sinus rhythm or sinus tachycardia but no SVT.  Cardiology does not suspect POTS, Marfan's or Ehlers Danlos Syndrome.  Due to hitting her head from a fall, she had a CT of head of head without contrast on 08/29/2020 personally reviewed which was slightly limited by streak artifact from metal portions of hair extension but appeared normal.   MRI of brain with and without contrast on 01/18/2021 was normal.  Routine EEG on 01/10/2021 was normal.  48 hour ambulatory EEG in April 2022 was normal.  Captured 3 habitual events without electrographic correlate.       Past NSAIDS/analgesics:  Norco, Excedrin  Past abortive triptans:  sumatriptan tab Past muscle relaxants:  baclofen Past anti-emetic:  Zofran Past antihypertensive medications:  atenolol, metoprolol Past antidepressant medications:  amitriptyline Past anticonvulsant medications:  Topiramate, gabapentin, Lyrica Past CGRP inhibitor:  Qulipta (made headaches worse), Aimovig 174m Other past therapies:  none     Family history of headache:  Father (migraines), mother (occasional headaches)  PAST MEDICAL HISTORY: Past Medical History:  Diagnosis Date   Anal pain    Anxiety    Complex regional pain syndrome of both lower extremities    per pt w/ right thigh numbness   Depression    Exercise-induced asthma    Fainting episodes    Fibromyalgia    GERD (gastroesophageal reflux disease)    History of concussion 04/2019   per pt no  residual   History of Osgood-Schlatter disease    History of syncope    ED visit 08-29-2020 w/ cp, palpitations--- pt sent for cardiology evaluation , dr lQuentin Ore office note 09-07-2020 dx orthostatic intolerence ;   echo in epic 09-14-2020 , normal;  event monitor 09-13-2020 no signfiance arrhythmia , SR/ST;  CT angio chest 09-02-2020  normal   Irritable bowel    Migraine without aura    Orthostatic hypotension    Sacral radiculopathy    Spondylolisthesis, lumbosacral region    L5--S1   Wears contact lenses     MEDICATIONS: Current Outpatient Medications on File Prior to Visit  Medication Sig Dispense Refill   AIMOVIG 140 MG/ML SOAJ SMARTSIG:140 Milligram(s) SUB-Q Every 4 Weeks     albuterol (PROVENTIL HFA;VENTOLIN HFA) 108 (90 Base) MCG/ACT inhaler Inhale 2 puffs into the lungs every 6 (six) hours as needed for wheezing or shortness of breath. 1 Inhaler 3   bisoprolol (ZEBETA) 5 MG tablet Take 1.5 tablets (7.5 mg total) by mouth daily. 135 tablet 2   buPROPion (WELLBUTRIN XL) 300 MG 24 hr tablet TAKE 1 TABLET BY MOUTH EVERY DAY 90 tablet 0   calcium carbonate (TUMS - DOSED  IN MG ELEMENTAL CALCIUM) 500 MG chewable tablet Chew 1 tablet by mouth as needed for indigestion or heartburn.     DULoxetine (CYMBALTA) 60 MG capsule Take 2 capsules (120 mg total) by mouth daily. 180 capsule 1   norethindrone-ethinyl estradiol (JUNEL 1/20) 1-20 MG-MCG tablet TAKE 1 TABLET BY MOUTH DAILY. TAKE CONTINUOUSLY, SKIP PLACEBO WEEK 112 tablet 2   ondansetron (ZOFRAN-ODT) 8 MG disintegrating tablet Take 8 mg by mouth every 8 (eight) hours as needed.     predniSONE (STERAPRED UNI-PAK 21 TAB) 10 MG (21) TBPK tablet take 70m day 1, then 527mday 2, then 4031may 3, then 44m39my 4, then 20mg59m 5, then 10mg 69m6, then STOP 21 tablet 0   promethazine (PHENERGAN) 12.5 MG tablet Take 1 tablet (12.5 mg total) by mouth every 6 (six) hours as needed for nausea or vomiting. 90 tablet 5   rizatriptan (MAXALT-MLT) 10  MG disintegrating tablet Take 1 tablet earliest onset of migraine.  May repeat in 2 hours if needed.  Maximum 2 tablets in 24 hours. 10 tablet 5   tiZANidine (ZANAFLEX) 4 MG tablet TAKE 0.5-1 TABLETS (2-4 MG TOTAL) BY MOUTH 2 (TWO) TIMES DAILY AS NEEDED FOR MUSCLE SPASMS. 180 tablet 1   Current Facility-Administered Medications on File Prior to Visit  Medication Dose Route Frequency Provider Last Rate Last Admin   0.9 %  sodium chloride infusion  500 mL Intravenous Once Armbruster, StevenCarlota Raspberry      ALLERGIES: Allergies  Allergen Reactions   Pecan Extract Allergy Skin Test Anaphylaxis   Strawberry Extract Anaphylaxis   Other Hives    Red Meat    Shellfish Allergy     lobster Other reaction(s): Other lobster   Gluten Meal     FAMILY HISTORY: Family History  Problem Relation Age of Onset   Arthritis Mother    Asthma Mother    Miscarriages / StillbKorear    Migraines Mother    COPD Father        per pt's mother-mild   Hypertension Father    Migraines Father    Asthma Brother    Migraines Brother    Anxiety disorder Brother    Arthritis Maternal Grandmother    Cancer Maternal Grandmother    Miscarriages / Stillbirths Maternal Grandmother    Diabetes Maternal Grandfather    Heart attack Maternal Grandfather    Heart disease Maternal Grandfather    Hyperlipidemia Maternal Grandfather    Hypertension Maternal Grandfather    Stroke Maternal Grandfather    Colon polyps Maternal Grandfather    Cancer Paternal Grandmother    Alcohol abuse Paternal Grandfather    Cancer Paternal Grandfather    COPD Paternal Grandfather    Heart attack Paternal Grandfather    Heart disease Paternal Grandfather    Hyperlipidemia Paternal Grandfather    Hypertension Paternal Grandfather    Asthma Brother    OCD Brother    Anxiety disorder Brother    Seizures Neg Hx    Depression Neg Hx    Bipolar disorder Neg Hx    Schizophrenia Neg Hx    ADD / ADHD Neg Hx    Autism Neg Hx     Colon cancer Neg Hx    Esophageal cancer Neg Hx    Rectal cancer Neg Hx    Stomach cancer Neg Hx       Objective:  *** General: No acute distress.  Patient appears well-groomed.   Head:  Normocephalic/atraumatic  Eyes:  Fundi examined but not visualized Neck: supple, no paraspinal tenderness, full range of motion Heart:  Regular rate and rhythm Lungs:  Clear to auscultation bilaterally Back: No paraspinal tenderness Neurological Exam: alert and oriented to person, place, and time.  Speech fluent and not dysarthric, language intact.  CN II-XII intact. Bulk and tone normal, muscle strength 5/5 throughout.  Sensation to light touch intact.  Deep tendon reflexes 2+ throughout, toes downgoing.  Finger to nose testing intact.  Gait normal, Romberg negative.   Metta Clines, DO

## 2022-05-05 ENCOUNTER — Encounter: Payer: Self-pay | Admitting: Neurology

## 2022-05-05 ENCOUNTER — Ambulatory Visit (INDEPENDENT_AMBULATORY_CARE_PROVIDER_SITE_OTHER): Payer: 59 | Admitting: Neurology

## 2022-05-05 VITALS — BP 128/72 | HR 94 | Ht 67.0 in | Wt 161.0 lb

## 2022-05-05 DIAGNOSIS — G43109 Migraine with aura, not intractable, without status migrainosus: Secondary | ICD-10-CM | POA: Diagnosis not present

## 2022-05-05 DIAGNOSIS — G43709 Chronic migraine without aura, not intractable, without status migrainosus: Secondary | ICD-10-CM | POA: Diagnosis not present

## 2022-05-05 DIAGNOSIS — R55 Syncope and collapse: Secondary | ICD-10-CM

## 2022-05-05 DIAGNOSIS — G90521 Complex regional pain syndrome I of right lower limb: Secondary | ICD-10-CM

## 2022-05-05 NOTE — Patient Instructions (Signed)
Plan to start Botox At earliest onset of migraine, take Ubrelvy 100mg .  May repeat after 2 hours.  Maximum 2 tablets in 24 hours.  Let me know if effective Promethazine for nausea

## 2022-05-07 ENCOUNTER — Telehealth: Payer: Self-pay | Admitting: Internal Medicine

## 2022-05-07 ENCOUNTER — Telehealth: Payer: Self-pay

## 2022-05-07 ENCOUNTER — Encounter: Payer: Self-pay | Admitting: Internal Medicine

## 2022-05-07 NOTE — Telephone Encounter (Signed)
Patient calling in bout her heart monitor results. Please advise 

## 2022-05-07 NOTE — Telephone Encounter (Signed)
Patient seen in office 05/05/22, Per Dr.Jaffe Start Botox. PA team if you could start a PA for Botox.

## 2022-05-08 ENCOUNTER — Telehealth (HOSPITAL_COMMUNITY): Payer: Self-pay | Admitting: Pharmacy Technician

## 2022-05-08 ENCOUNTER — Other Ambulatory Visit (HOSPITAL_COMMUNITY): Payer: Self-pay

## 2022-05-08 DIAGNOSIS — G43019 Migraine without aura, intractable, without status migrainosus: Secondary | ICD-10-CM

## 2022-05-08 NOTE — Telephone Encounter (Signed)
Submitted patient info to [employer] Portal for benefits investigation- [id number]

## 2022-05-08 NOTE — Telephone Encounter (Signed)
Patient Advocate Encounter   Received notification that prior authorization for Botox 200UNIT solution is required.   PA submitted on 05/08/2022 Key [id number]  Status is pending       Roland Earl, CPhT Pharmacy Patient Advocate Specialist [facility] Patient Advocate Team Direct Number: [phone]  Fax: [phone]

## 2022-05-12 NOTE — Telephone Encounter (Signed)
Full Benefits Summary uploaded to patient's chart:

## 2022-05-13 NOTE — Telephone Encounter (Signed)
Pt has been notified of results in previous encounter dated 05/07/2022.  Please see that encounter for complete details.

## 2022-05-16 ENCOUNTER — Other Ambulatory Visit (HOSPITAL_COMMUNITY): Payer: Self-pay

## 2022-05-16 MED ORDER — BOTOX 200 UNITS IJ SOLR
INTRAMUSCULAR | 4 refills | Status: DC
  Filled 2022-05-16: qty 1, fill #0
  Filled 2022-05-23: qty 1, 90d supply, fill #0
  Filled 2022-08-12: qty 1, 90d supply, fill #1

## 2022-05-16 NOTE — Telephone Encounter (Signed)
Patient Advocate Encounter  Prior Authorization for Botox 200UNIT solution has been approved.    PA# [id number] Effective dates: 05/16/2022 through 05/16/2023  Can be filled at [facility]    Roland Earl, CPhT Pharmacy Patient Advocate Specialist [facility] Patient Advocate Team Direct Number: [phone]  Fax: [phone]

## 2022-05-16 NOTE — Addendum Note (Signed)
Addended by: Leida Lauth on: 05/16/2022 03:56 PM   Modules accepted: Orders

## 2022-05-16 NOTE — Telephone Encounter (Signed)
  Patient advised of Approval. Order sent over to [facility] outpatient pharmacy.  Messaged the front desk to add patient to August Botox.

## 2022-05-17 ENCOUNTER — Other Ambulatory Visit (HOSPITAL_COMMUNITY): Payer: Self-pay

## 2022-05-19 ENCOUNTER — Other Ambulatory Visit (HOSPITAL_COMMUNITY): Payer: Self-pay

## 2022-05-20 ENCOUNTER — Other Ambulatory Visit (HOSPITAL_COMMUNITY): Payer: Self-pay

## 2022-05-22 ENCOUNTER — Other Ambulatory Visit (HOSPITAL_COMMUNITY): Payer: Self-pay

## 2022-05-23 ENCOUNTER — Other Ambulatory Visit (HOSPITAL_COMMUNITY): Payer: Self-pay

## 2022-05-24 ENCOUNTER — Other Ambulatory Visit (HOSPITAL_COMMUNITY): Payer: Self-pay

## 2022-06-02 ENCOUNTER — Other Ambulatory Visit (HOSPITAL_COMMUNITY): Payer: Self-pay

## 2022-06-05 ENCOUNTER — Other Ambulatory Visit (HOSPITAL_COMMUNITY): Payer: Self-pay

## 2022-06-06 ENCOUNTER — Encounter: Payer: 59 | Attending: Physical Medicine and Rehabilitation | Admitting: Physical Medicine and Rehabilitation

## 2022-06-06 ENCOUNTER — Telehealth: Payer: Self-pay | Admitting: Physical Medicine and Rehabilitation

## 2022-06-06 ENCOUNTER — Encounter: Payer: Self-pay | Admitting: Physical Medicine and Rehabilitation

## 2022-06-06 DIAGNOSIS — M797 Fibromyalgia: Secondary | ICD-10-CM | POA: Diagnosis not present

## 2022-06-06 DIAGNOSIS — M7918 Myalgia, other site: Secondary | ICD-10-CM | POA: Insufficient documentation

## 2022-06-06 DIAGNOSIS — G894 Chronic pain syndrome: Secondary | ICD-10-CM | POA: Diagnosis not present

## 2022-06-06 DIAGNOSIS — R000 Tachycardia, unspecified: Secondary | ICD-10-CM | POA: Insufficient documentation

## 2022-06-06 NOTE — Telephone Encounter (Signed)
Kiara Brewer is interested in shock wave therapy she is scheduled for 8/31 is that too soon having trigger points on 8/11?  She has been advised of cost.

## 2022-06-06 NOTE — Patient Instructions (Signed)
Plan: Patient here for trigger point injections for  Consent done and on chart.  Cleaned areas with alcohol and injected using a 27 gauge 1.5 inch needle  Injected  3cc Using 1% Lidocaine with no EPI  Upper traps Levators Posterior scalenes Middle scalenes Splenius Capitus Pectoralis Major Rhomboids Infraspinatus Teres Major/minor Thoracic paraspinals Lumbar paraspinals- B/L x3 in lumbar paraspinal s Other injections-    Patient's level of pain prior was 8/10 Current level of pain after injections is 9/10   There was no bleeding or complications.  Patient was advised to drink a lot of water on day after injections to flush system Will have increased soreness for 12-48 hours after injections.  Can use Lidocaine patches the day AFTER injections Can use theracane on day of injections in places didn't inject Can use heating pad 4-6 hours AFTER injections   2. Shock wave therapy- discussed- can schedule up front.  3. Doesn't need refills of Cymbalta- Tizanidine-   4. Dr Everlena Cooper changed her to Botox on 8/15- failed Amovig and Emgality- - explained it's 30 injections.   5. We discussed the infusion for Migraines- Vyepti-   6. F/U in 3 months- trp injections and f/u on pain.

## 2022-06-06 NOTE — Progress Notes (Signed)
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 Also has chronic pain syndrome- could also be due to fibromyalgia/Myofascial pain syndrome  Here for f/u on fibromyalgia/myofascial pain syndrome.   Got service dog- [name]- 54/67 years old- been training since was 64 weeks old- is true service dog.   Got in May 2023.   Been in a flare- started 1 week ago.  Both Migraines and pain-  Rating pain- 8/10 in flare Has been using Zanaflex more- using almost every day- helps sometimes; sometimes not.      Plan: Patient here for trigger point injections for  Consent done and on chart.  Cleaned areas with alcohol and injected using a 27 gauge 1.5 inch needle  Injected  3cc Using 1% Lidocaine with no EPI  Upper traps Levators Posterior scalenes Middle scalenes Splenius Capitus Pectoralis Major Rhomboids Infraspinatus Teres Major/minor Thoracic paraspinals Lumbar paraspinals- B/L x3 in lumbar paraspinal s Other injections-    Patient's level of pain prior was 8/10 Current level of pain after injections is 9/10   There was no bleeding or complications.  Patient was advised to drink a lot of water on day after injections to flush system Will have increased soreness for 12-48 hours after injections.  Can use Lidocaine patches the day AFTER injections Can use theracane on day of injections in places didn't inject Can use heating pad 4-6 hours AFTER injections   2. Shock wave therapy- discussed- can schedule up front.  3. Doesn't need refills of Cymbalta- Tizanidine-   4. Dr Everlena Cooper changed her to Botox on 8/15- failed Amovig and Emgality- - explained it's 30 injections.   5. We discussed the infusion for Migraines- Vyepti-   6. F/U in 3 months- trp injections and f/u on pain.

## 2022-06-09 ENCOUNTER — Ambulatory Visit (INDEPENDENT_AMBULATORY_CARE_PROVIDER_SITE_OTHER): Payer: 59 | Admitting: Internal Medicine

## 2022-06-09 ENCOUNTER — Encounter: Payer: Self-pay | Admitting: Internal Medicine

## 2022-06-09 VITALS — Ht 67.0 in | Wt 167.8 lb

## 2022-06-09 DIAGNOSIS — G901 Familial dysautonomia [Riley-Day]: Secondary | ICD-10-CM

## 2022-06-09 NOTE — Patient Instructions (Signed)
Medication Instructions:  Your physician recommends that you continue on your current medications as directed. Please refer to the Current Medication list given to you today.  *If you need a refill on your cardiac medications before your next appointment, please call your pharmacy*   Lab Work: None ordered.  If you have labs (blood work) drawn today and your tests are completely normal, you will receive your results only by: MyChart Message (if you have MyChart) OR A paper copy in the mail If you have any lab test that is abnormal or we need to change your treatment, we will call you to review the results.   Testing/Procedures: None ordered.    Follow-Up: At [facility], you and your health needs are our priority.  As part of our continuing mission to provide you with exceptional heart care, we have created designated Provider Care Teams.  These Care Teams include your primary Cardiologist (physician) and Advanced Practice Providers (APPs -  Physician Assistants and Nurse Practitioners) who all work together to provide you with the care you need, when you need it.  We recommend signing up for the patient portal called "MyChart".  Sign up information is provided on this After Visit Summary.  MyChart is used to connect with patients for Virtual Visits (Telemedicine).  Patients are able to view lab/test results, encounter notes, upcoming appointments, etc.  Non-urgent messages can be sent to your provider as well.   To learn more about what you can do with MyChart, go to [email or web address].    Your next appointment:   6 months with Dr Marline Backbone    Other Instructions Recommended salt supplementation.  The goal is about 2 g of sodium, not sodium chloride, a day.  Salt supplements include oral ThermaTabs, buffered sodium preparation, SaltStick Vitassium,  Other options include NUUN.  This comes in pill form and is dissolved in liquids.  Other liquid preparations include liquid IV,  Pedialyte advance care, TRI-oral Also discussed the role of compression wear including thigh sleeves and abdominal binders.  Calf compression is not specifically recommended..   Handicap Parking Placard application provided  Important Information About Sugar

## 2022-06-09 NOTE — Progress Notes (Signed)
Patient Care Team: Hite, Jackquline Berlin, FNP as PCP - General (Family Medicine) Meriam Sprague, MD as PCP - Cardiology (Cardiology) Romualdo Bolk, MD as Consulting Physician (Obstetrics and Gynecology)   HPI  Kiara Brewer is a 21 y.o. female seen in follow-up for spells of "syncope "with symptoms suggestive of dysautonomia.  She can have multiple episodes of these a day, they are unassociated with complete loss of postural tone, eyes are closed, these raise the issue for me as to whether these were pseudo syncope.  She has had orthostatic vital signs objectively associated with POTS.  Event recorder 11/21 had demonstrated triggered events with heart rates less than 100.  Has been started on bisoprolol with some benefit.  At this juncture, she thinks that things have been getting worse.  She has had no syncope episodes up to 5 times a day.  Sometimes she has recurrent orthostatic intolerance.  She has been observed by her mother as being pale.  She describes flushing and diaphoresis and nausea.  Events occur mostly is vertical but she also has them when she is having sex with her husband horizontally.  Trying to review whether she is maintained postural tone during these events, it is not clear that that is the case, she describes falling over the weightlifting bar or falling over the bed.  Significant problems with nausea; she has chronic recurrent migraines sometimes lasting "a month "and chronic pain.  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   Records and Results Reviewed   Past Medical History:  Diagnosis Date   Anal pain    Anxiety    Complex regional pain syndrome of both lower extremities    per pt w/ right thigh numbness   Depression    Exercise-induced asthma    Fainting episodes    Fibromyalgia    GERD (gastroesophageal reflux disease)    History of concussion 04/2019   per pt no residual   History of  Osgood-Schlatter disease    History of syncope    ED visit 08-29-2020 w/ cp, palpitations--- pt sent for cardiology evaluation , dr Lalla Brothers, office note 09-07-2020 dx orthostatic intolerence ;   echo in epic 09-14-2020 , normal;  event monitor 09-13-2020 no signfiance arrhythmia , SR/ST;  CT angio chest 09-02-2020  normal   Irritable bowel    Migraine without aura    Orthostatic hypotension    Sacral radiculopathy    Spondylolisthesis, lumbosacral region    L5--S1   Wears contact lenses     Past Surgical History:  Procedure Laterality Date   RECTAL EXAM UNDER ANESTHESIA N/A 11/22/2020   Procedure: ANAL  EXAM UNDER ANESTHESIA;  Surgeon: Romie Levee, MD;  Location: [facility];  Service: General;  Laterality: N/A;   SPHINCTEROTOMY N/A 11/22/2020   Procedure: CHEMICAL SPHINCTEROTOMY/ FISSURECTOMY;  Surgeon: Romie Levee, MD;  Location: [facility];  Service: General;  Laterality: N/A;   TONSILLECTOMY AND ADENOIDECTOMY  2010   WISDOM TOOTH EXTRACTION  2020    Current Meds  Medication Sig   AIMOVIG 140 MG/ML SOAJ    albuterol (PROVENTIL HFA;VENTOLIN HFA) 108 (90 Base) MCG/ACT inhaler Inhale 2 puffs into the lungs every 6 (six) hours as needed for wheezing or shortness of breath.   bisoprolol (ZEBETA) 5 MG tablet Take 1.5 tablets (7.5 mg total) by mouth daily.   botulinum toxin Type A (BOTOX) 200 units injection Inject 155 units  IM into multiple site in the face,neck and head once every 90 days   buPROPion (WELLBUTRIN XL) 150 MG 24 hr tablet Take 1 tablet by mouth every morning.   buPROPion (WELLBUTRIN XL) 300 MG 24 hr tablet Take 1 tablet by mouth daily.   calcium carbonate (TUMS - DOSED IN MG ELEMENTAL CALCIUM) 500 MG chewable tablet Chew 1 tablet by mouth as needed for indigestion or heartburn.   DULoxetine (CYMBALTA) 60 MG capsule Take 2 capsules (120 mg total) by mouth daily.   DULoxetine HCl 30 MG CSDR    folic acid (FOLVITE) 1 MG tablet     gabapentin (NEURONTIN) 600 MG tablet    levETIRAcetam (KEPPRA) 500 MG tablet    meloxicam (MOBIC) 7.5 MG tablet    montelukast (SINGULAIR) 10 MG tablet    norethindrone-ethinyl estradiol (JUNEL 1/20) 1-20 MG-MCG tablet TAKE 1 TABLET BY MOUTH DAILY. TAKE CONTINUOUSLY, SKIP PLACEBO WEEK   ondansetron (ZOFRAN) 4 MG tablet    ondansetron (ZOFRAN-ODT) 8 MG disintegrating tablet Take 8 mg by mouth every 8 (eight) hours as needed.   predniSONE (STERAPRED UNI-PAK 21 TAB) 10 MG (21) TBPK tablet take 60mg  day 1, then 50mg  day 2, then 40mg  day 3, then 30mg  day 4, then 20mg  day 5, then 10mg  day 6, then STOP   pregabalin (LYRICA) 150 MG capsule    promethazine (PHENERGAN) 12.5 MG tablet Take 1 tablet (12.5 mg total) by mouth every 6 (six) hours as needed for nausea or vomiting.   rizatriptan (MAXALT-MLT) 10 MG disintegrating tablet Take 1 tablet earliest onset of migraine.  May repeat in 2 hours if needed.  Maximum 2 tablets in 24 hours.   SUMAtriptan (IMITREX) 50 MG tablet    tiZANidine (ZANAFLEX) 4 MG tablet TAKE 0.5-1 TABLETS (2-4 MG TOTAL) BY MOUTH 2 (TWO) TIMES DAILY AS NEEDED FOR MUSCLE SPASMS.   Current Facility-Administered Medications for the 06/09/22 encounter (Office Visit) with , MD  Medication   0.9 %  sodium chloride infusion    Allergies  Allergen Reactions   Pecan Extract Allergy Skin Test Anaphylaxis   Strawberry Extract Anaphylaxis   Other Hives    Red Meat    Shellfish Allergy     lobster Other reaction(s): Other lobster   Gluten Meal       Review of Systems negative except from HPI and PMH  Physical Exam Ht 5\' 7"  (1.702 m)   Wt 167 lb 12.8 oz (76.1 kg)   BMI 26.28 kg/m  Well developed and well nourished in no acute distress HENT normal E scleral and icterus clear Neck Supple JVP flat; carotids brisk and full Clear to ausculation Regular rate and rhythm, no murmurs gallops or rub Soft with active bowel sounds No clubbing cyanosis  Edema Alert and  oriented, grossly normal motor and sensory function Skin Warm and Dry  ECG    CrCl cannot be calculated (Patient's most recent lab result is older than the maximum 21 days allowed.).   Assessment and  Plan  Recurrent syncope   Orthostatic intolerance   Complex regional pain   Fibromyalgia   Anxiety/depression  I remain perplexed.  Orthostatic vital signs are normal.  Event recorder seems during her syncopal events flushing etc. are associated with sinus rhythm without clear changes in heart rate.  Her symptoms with residual orthostatic intolerance, heat intolerance, and observed pallor and diaphoresis are more consistent with dysautonomia.  We have again reviewed things that may be therapeutic including the importance of salt.  She has struggled because of her nausea.  I have encouraged her to target 3 g of sodium a day and then we could consider the addition of fludrocortisone.  Have also encouraged her to use nonpharmacological therapies including compression of abdomen and/or thighs.  Overall, I have reiterated my concerns that we are not making headway and has suggested that [facility] or [place] might provide her a better answer than I have been able to do.  I remain impressed at the emotional disconnect between her symptoms and the levity with which she describes her events.  Previously also noted that her eyes were closed suggesting pseudo syncope.  We will renew her handicap parking pass; however, she is reminded that in [place], recurrent syncope precludes driving for 6 months   Current medicines are reviewed at length with the patient today .  The patient does not  have concerns regarding medicines.

## 2022-06-10 ENCOUNTER — Telehealth: Payer: Self-pay | Admitting: Internal Medicine

## 2022-06-10 DIAGNOSIS — G901 Familial dysautonomia [Riley-Day]: Secondary | ICD-10-CM

## 2022-06-10 NOTE — Telephone Encounter (Signed)
Patient called stating they would be able to get her in with Dr. Rema Jasmine PA in October as Dr. Rema Jasmine in [place] is not available for 2 years.  They need last cardiac testing, last OV note and referral faxed over to them at [phone].

## 2022-06-13 ENCOUNTER — Ambulatory Visit (INDEPENDENT_AMBULATORY_CARE_PROVIDER_SITE_OTHER): Payer: 59 | Admitting: Neurology

## 2022-06-13 DIAGNOSIS — G43709 Chronic migraine without aura, not intractable, without status migrainosus: Secondary | ICD-10-CM

## 2022-06-13 MED ORDER — ONABOTULINUMTOXINA 200 UNITS IJ SOLR
200.0000 [IU] | Freq: Once | INTRAMUSCULAR | Status: AC
  Administered 2022-06-13: 155 [IU] via INTRAMUSCULAR

## 2022-06-13 NOTE — Progress Notes (Signed)
[facility]  ° °Procedure Note Botox ° °Attending: Dr. Ashli Selders ° °Preoperative Diagnosis(es): Chronic migraine ° °Consent obtained from: The patient °Benefits discussed included, but were not limited to decreased muscle tightness, increased joint range of motion, and decreased pain.  Risk discussed included, but were not limited pain and discomfort, bleeding, bruising, excessive weakness, venous thrombosis, muscle atrophy and dysphagia.  Anticipated outcomes of the procedure as well as he risks and benefits of the alternatives to the procedure, and the roles and tasks of the personnel to be involved, were discussed with the patient, and the patient consents to the procedure and agrees to proceed. A copy of the patient medication guide was given to the patient which explains the blackbox warning. ° °Patients identity and treatment sites confirmed Yes.  . ° °Details of Procedure: °Skin was cleaned with alcohol. Prior to injection, the needle plunger was aspirated to make sure the needle was not within a blood vessel.  There was no blood retrieved on aspiration.   ° °Following is a summary of the muscles injected  And the amount of Botulinum toxin used: ° °Dilution °200 units of Botox was reconstituted with 4 ml of preservative free normal saline. °Time of reconstitution: At the time of the office visit (<30 minutes prior to injection)  ° °Injections  °155 total units of Botox was injected with a 30 gauge needle. ° °Injection Sites: °L occipitalis: 15 units- 3 sites  °R occiptalis: 15 units- 3 sites ° °L upper trapezius: 15 units- 3 sites °R upper trapezius: 15 units- 3 sits          °L paraspinal: 10 units- 2 sites °R paraspinal: 10 units- 2 sites ° °Face °L frontalis(2 injection sites):10 units   °R frontalis(2 injection sites):10 units         °L corrugator: 5 units   °R corrugator: 5 units           °Procerus: 5 units   °L temporalis: 20 units °R temporalis: 20 units  ° °Agent:  °200 units of botulinum Type  A (Onobotulinum Toxin type A) was reconstituted with 4 ml of preservative free normal saline.  °Time of reconstitution: At the time of the office visit (<30 minutes prior to injection)  ° ° ° Total injected (Units):  155 ° Total wasted (Units):  45 ° °Patient tolerated procedure well without complications.   °Reinjection is anticipated in 3 months. ° ° °

## 2022-06-13 NOTE — Telephone Encounter (Signed)
Attempted phone call to pt and left voicemail message to contact RN at [phone]. 

## 2022-06-13 NOTE — Telephone Encounter (Signed)
Patient's mother is following up regarding having cardiac testing, OV notes, and referral sent to [facility] instead of Dr. Rema Jasmine' office. Referral is the main thing they are needing. She states they have an opening in September for POTS evaluation and patient is hopeful to be seen ASAP. Please return call this afternoon if at all possible.

## 2022-06-16 ENCOUNTER — Encounter: Payer: Self-pay | Admitting: Internal Medicine

## 2022-06-16 NOTE — Addendum Note (Signed)
Addended by: Alois Cliche on: 06/16/2022 02:54 PM   Modules accepted: Orders

## 2022-06-16 NOTE — Telephone Encounter (Signed)
Referral placed and records request has been sent to [facility] pool.  Please see encounter dated 06/16/2022 for additional details.

## 2022-06-24 ENCOUNTER — Encounter: Payer: Self-pay | Admitting: Internal Medicine

## 2022-06-26 ENCOUNTER — Encounter: Payer: 59 | Admitting: Physical Medicine & Rehabilitation

## 2022-07-24 ENCOUNTER — Other Ambulatory Visit (HOSPITAL_COMMUNITY): Payer: Self-pay

## 2022-07-31 ENCOUNTER — Other Ambulatory Visit (HOSPITAL_COMMUNITY): Payer: Self-pay

## 2022-08-04 ENCOUNTER — Other Ambulatory Visit: Payer: Self-pay | Admitting: Obstetrics and Gynecology

## 2022-08-04 DIAGNOSIS — K3184 Gastroparesis: Secondary | ICD-10-CM | POA: Insufficient documentation

## 2022-08-04 DIAGNOSIS — M419 Scoliosis, unspecified: Secondary | ICD-10-CM | POA: Insufficient documentation

## 2022-08-04 DIAGNOSIS — K5900 Constipation, unspecified: Secondary | ICD-10-CM | POA: Insufficient documentation

## 2022-08-04 DIAGNOSIS — I951 Orthostatic hypotension: Secondary | ICD-10-CM | POA: Insufficient documentation

## 2022-08-04 DIAGNOSIS — R197 Diarrhea, unspecified: Secondary | ICD-10-CM | POA: Insufficient documentation

## 2022-08-04 DIAGNOSIS — K219 Gastro-esophageal reflux disease without esophagitis: Secondary | ICD-10-CM | POA: Insufficient documentation

## 2022-08-04 DIAGNOSIS — R000 Tachycardia, unspecified: Secondary | ICD-10-CM | POA: Insufficient documentation

## 2022-08-04 DIAGNOSIS — K602 Anal fissure, unspecified: Secondary | ICD-10-CM | POA: Insufficient documentation

## 2022-08-04 DIAGNOSIS — R1013 Epigastric pain: Secondary | ICD-10-CM | POA: Insufficient documentation

## 2022-08-04 DIAGNOSIS — G9332 Myalgic encephalomyelitis/chronic fatigue syndrome: Secondary | ICD-10-CM | POA: Insufficient documentation

## 2022-08-04 DIAGNOSIS — G479 Sleep disorder, unspecified: Secondary | ICD-10-CM | POA: Insufficient documentation

## 2022-08-04 DIAGNOSIS — N941 Unspecified dyspareunia: Secondary | ICD-10-CM | POA: Insufficient documentation

## 2022-08-04 DIAGNOSIS — R112 Nausea with vomiting, unspecified: Secondary | ICD-10-CM | POA: Insufficient documentation

## 2022-08-04 DIAGNOSIS — G2581 Restless legs syndrome: Secondary | ICD-10-CM | POA: Insufficient documentation

## 2022-08-04 DIAGNOSIS — H52203 Unspecified astigmatism, bilateral: Secondary | ICD-10-CM | POA: Insufficient documentation

## 2022-08-04 DIAGNOSIS — Z3041 Encounter for surveillance of contraceptive pills: Secondary | ICD-10-CM

## 2022-08-04 DIAGNOSIS — H9319 Tinnitus, unspecified ear: Secondary | ICD-10-CM | POA: Insufficient documentation

## 2022-08-04 NOTE — Telephone Encounter (Signed)
Medication refill request: junel 1/20 takes continuous Last AEX:  05-03-21 Next AEX: not scheduled Last MMG (if hormonal medication request): none Refill authorized: pharmacy note placed for patient to schedule yearly exam for future refills. Please approve 37mth if appropriate

## 2022-08-05 ENCOUNTER — Encounter: Payer: Self-pay | Admitting: Physical Medicine and Rehabilitation

## 2022-08-06 ENCOUNTER — Telehealth: Payer: Self-pay

## 2022-08-06 NOTE — Telephone Encounter (Signed)
Kiara Brewer left a detailed Mychart message on 08/05/2022.  This is just to inform you to review on your return to the office. As promised to the patient.

## 2022-08-07 ENCOUNTER — Telehealth: Payer: Self-pay | Admitting: Nurse Practitioner

## 2022-08-07 NOTE — Telephone Encounter (Signed)
Inbound call from patient states she was taken to the hospital last night for abdominal pain, says her colon is swollen. She made an appt with [facility] on 11/14 but states she is in a lot of pain and not sure she can wait that long. Please advise

## 2022-08-07 NOTE — Telephone Encounter (Signed)
Spoke with pt husband Sam. Inocente Salles stated that pt was currently in the ED again for abdominal pain. Sam was notified that we do have an availability for tomorrow with Ellouise Newer PA. Pt was scheduled for 08/08/2022 at 3:30 with Ellouise Newer PA : Sam made aware: Sam verbalized understanding with all questions answered.

## 2022-08-08 ENCOUNTER — Encounter (HOSPITAL_COMMUNITY): Payer: Self-pay

## 2022-08-08 ENCOUNTER — Encounter: Payer: Self-pay | Admitting: Physician Assistant

## 2022-08-08 ENCOUNTER — Observation Stay (HOSPITAL_COMMUNITY)
Admission: EM | Admit: 2022-08-08 | Discharge: 2022-08-11 | Disposition: A | Discharge status: Home / Self Care | Payer: 59 | Attending: Internal Medicine | Admitting: Internal Medicine

## 2022-08-08 ENCOUNTER — Ambulatory Visit (INDEPENDENT_AMBULATORY_CARE_PROVIDER_SITE_OTHER): Payer: 59 | Admitting: Physician Assistant

## 2022-08-08 ENCOUNTER — Other Ambulatory Visit: Payer: Self-pay

## 2022-08-08 VITALS — BP 118/64 | HR 73 | Ht 67.0 in | Wt 167.1 lb

## 2022-08-08 DIAGNOSIS — Z818 Family history of other mental and behavioral disorders: Secondary | ICD-10-CM

## 2022-08-08 DIAGNOSIS — F329 Major depressive disorder, single episode, unspecified: Secondary | ICD-10-CM | POA: Insufficient documentation

## 2022-08-08 DIAGNOSIS — J45990 Exercise induced bronchospasm: Secondary | ICD-10-CM | POA: Diagnosis present

## 2022-08-08 DIAGNOSIS — K921 Melena: Secondary | ICD-10-CM

## 2022-08-08 DIAGNOSIS — G8929 Other chronic pain: Secondary | ICD-10-CM | POA: Diagnosis present

## 2022-08-08 DIAGNOSIS — Z79899 Other long term (current) drug therapy: Secondary | ICD-10-CM | POA: Insufficient documentation

## 2022-08-08 DIAGNOSIS — R1031 Right lower quadrant pain: Secondary | ICD-10-CM | POA: Diagnosis not present

## 2022-08-08 DIAGNOSIS — M1990 Unspecified osteoarthritis, unspecified site: Secondary | ICD-10-CM | POA: Diagnosis present

## 2022-08-08 DIAGNOSIS — M5418 Radiculopathy, sacral and sacrococcygeal region: Secondary | ICD-10-CM | POA: Diagnosis present

## 2022-08-08 DIAGNOSIS — F339 Major depressive disorder, recurrent, unspecified: Secondary | ICD-10-CM | POA: Diagnosis present

## 2022-08-08 DIAGNOSIS — E872 Acidosis, unspecified: Secondary | ICD-10-CM

## 2022-08-08 DIAGNOSIS — K625 Hemorrhage of anus and rectum: Principal | ICD-10-CM | POA: Insufficient documentation

## 2022-08-08 DIAGNOSIS — F419 Anxiety disorder, unspecified: Secondary | ICD-10-CM | POA: Diagnosis present

## 2022-08-08 DIAGNOSIS — M797 Fibromyalgia: Secondary | ICD-10-CM | POA: Diagnosis present

## 2022-08-08 DIAGNOSIS — Z825 Family history of asthma and other chronic lower respiratory diseases: Secondary | ICD-10-CM

## 2022-08-08 DIAGNOSIS — G90A Postural orthostatic tachycardia syndrome (POTS): Secondary | ICD-10-CM | POA: Insufficient documentation

## 2022-08-08 DIAGNOSIS — M4317 Spondylolisthesis, lumbosacral region: Secondary | ICD-10-CM | POA: Diagnosis present

## 2022-08-08 DIAGNOSIS — K559 Vascular disorder of intestine, unspecified: Secondary | ICD-10-CM | POA: Diagnosis not present

## 2022-08-08 DIAGNOSIS — Q796 Ehlers-Danlos syndrome, unspecified: Secondary | ICD-10-CM

## 2022-08-08 DIAGNOSIS — G43009 Migraine without aura, not intractable, without status migrainosus: Secondary | ICD-10-CM | POA: Diagnosis present

## 2022-08-08 DIAGNOSIS — D62 Acute posthemorrhagic anemia: Secondary | ICD-10-CM | POA: Diagnosis present

## 2022-08-08 DIAGNOSIS — R935 Abnormal findings on diagnostic imaging of other abdominal regions, including retroperitoneum: Secondary | ICD-10-CM | POA: Diagnosis not present

## 2022-08-08 DIAGNOSIS — Z91014 Allergy to mammalian meats: Secondary | ICD-10-CM

## 2022-08-08 DIAGNOSIS — F32A Depression, unspecified: Secondary | ICD-10-CM | POA: Diagnosis present

## 2022-08-08 DIAGNOSIS — Z8261 Family history of arthritis: Secondary | ICD-10-CM

## 2022-08-08 DIAGNOSIS — Z91018 Allergy to other foods: Secondary | ICD-10-CM

## 2022-08-08 DIAGNOSIS — Z793 Long term (current) use of hormonal contraceptives: Secondary | ICD-10-CM

## 2022-08-08 DIAGNOSIS — K589 Irritable bowel syndrome without diarrhea: Secondary | ICD-10-CM | POA: Diagnosis present

## 2022-08-08 DIAGNOSIS — Z87820 Personal history of traumatic brain injury: Secondary | ICD-10-CM

## 2022-08-08 DIAGNOSIS — R109 Unspecified abdominal pain: Secondary | ICD-10-CM

## 2022-08-08 DIAGNOSIS — Z91013 Allergy to seafood: Secondary | ICD-10-CM

## 2022-08-08 DIAGNOSIS — K922 Gastrointestinal hemorrhage, unspecified: Secondary | ICD-10-CM | POA: Diagnosis present

## 2022-08-08 DIAGNOSIS — K219 Gastro-esophageal reflux disease without esophagitis: Secondary | ICD-10-CM | POA: Diagnosis present

## 2022-08-08 LAB — COMPREHENSIVE METABOLIC PANEL
ALT: 20 U/L (ref 0–44)
AST: 18 U/L (ref 15–41)
Albumin: 3.7 g/dL (ref 3.5–5.0)
Alkaline Phosphatase: 30 U/L — ABNORMAL LOW (ref 38–126)
Anion gap: 6 (ref 5–15)
BUN: 8 mg/dL (ref 6–20)
CO2: 25 mmol/L (ref 22–32)
Calcium: 9 mg/dL (ref 8.9–10.3)
Chloride: 104 mmol/L (ref 98–111)
Creatinine, Ser: 0.86 mg/dL (ref 0.44–1.00)
GFR, Estimated: >60 mL/min (ref >60)
Glucose, Bld: 90 mg/dL (ref 70–99)
Potassium: 3.9 mmol/L (ref 3.5–5.1)
Sodium: 135 mmol/L (ref 135–145)
Total Bilirubin: 0.6 mg/dL (ref 0.3–1.2)
Total Protein: 6.1 g/dL — ABNORMAL LOW (ref 6.5–8.1)

## 2022-08-08 LAB — URINALYSIS, ROUTINE W REFLEX MICROSCOPIC
Bilirubin Urine: NEGATIVE
Glucose, UA: NEGATIVE mg/dL
Hgb urine dipstick: NEGATIVE
Ketones, ur: NEGATIVE mg/dL
Leukocytes,Ua: NEGATIVE
Nitrite: NEGATIVE
Protein, ur: NEGATIVE mg/dL
Specific Gravity, Urine: 1.004 — ABNORMAL LOW (ref 1.005–1.030)
pH: 6 (ref 5.0–8.0)

## 2022-08-08 LAB — TYPE AND SCREEN
ABO/RH(D): A POS
Antibody Screen: NEGATIVE

## 2022-08-08 LAB — CBC WITH DIFFERENTIAL/PLATELET
Abs Immature Granulocytes: 0.01 10*3/uL (ref 0.00–0.07)
Basophils Absolute: 0 10*3/uL (ref 0.0–0.1)
Basophils Relative: 1 %
Eosinophils Absolute: 0.1 10*3/uL (ref 0.0–0.5)
Eosinophils Relative: 2 %
HCT: 29.6 % — ABNORMAL LOW (ref 36.0–46.0)
Hemoglobin: 10.6 g/dL — ABNORMAL LOW (ref 12.0–15.0)
Immature Granulocytes: 0 %
Lymphocytes Relative: 35 %
Lymphs Abs: 2.5 10*3/uL (ref 0.7–4.0)
MCH: 33 pg (ref 26.0–34.0)
MCHC: 35.8 g/dL (ref 30.0–36.0)
MCV: 92.2 fL (ref 80.0–100.0)
Monocytes Absolute: 0.5 10*3/uL (ref 0.1–1.0)
Monocytes Relative: 7 %
Neutro Abs: 4 10*3/uL (ref 1.7–7.7)
Neutrophils Relative %: 55 %
Platelets: 249 10*3/uL (ref 150–400)
RBC: 3.21 MIL/uL — ABNORMAL LOW (ref 3.87–5.11)
RDW: 11.5 % (ref 11.5–15.5)
WBC: 7.1 10*3/uL (ref 4.0–10.5)
nRBC: 0 % (ref 0.0–0.2)

## 2022-08-08 LAB — LIPASE, BLOOD: Lipase: 32 U/L (ref 11–51)

## 2022-08-08 LAB — PREGNANCY, URINE: Preg Test, Ur: NEGATIVE

## 2022-08-08 NOTE — Patient Instructions (Addendum)
Please go directly to [facility] emergency department.

## 2022-08-08 NOTE — Progress Notes (Signed)
Chief Complaint: Hematochezia and abdominal pain  HPI:    Kiara Brewer is a 21 year old female, known to Dr. Havery Moros, with a complicated past medical history as listed below including her list and labs, complex regional pain syndrome of both lower extremities, fibromyalgia, GERD, Osgood-Schlatter disease, POTS,  spondylolisthesis and multiple others listed below, who was referred to me by Eula Fried, * for a complaint of abdominal pain and hematochezia.      01/29/21 EGD with LA grade a reflux esophagitis and otherwise normal.  Esophagus was dilated to 17 mm.  Biopsies showed mild inflammation at the bottom of her esophagus.  Colonoscopy same day with a fissure, internal hemorrhoids, anal papillae were hypertrophied and biopsies taken for microscopic colitis.  Biopsies were normal.    08/06/2022 patient seen in the ED at [facility].  At that time presenting with abdominal pain.  Described that when she arrived in [place] on the plane from the [facility] from being worked up for POTS she began to have severe abdominal pain coming off the airplane she then had 2 episodes of diarrhea with bright red blood in the first stool.  This is some of the worst pain she is ever felt and she called an ambulance from the car in order to be brought to the hospital.  She had a rectal exam which showed no fissures or hemorrhoids and was Hemoccult negative.  Patient CT of the abdomen pelvis returned showing segmental versus infectious inflammatory colitis in the transverse and proximal descending colon.  She was given Zofran.    08/07/2022 patient seen in the ER again for worsening abdominal pain.  At that time tender in the lower quadrants.  Hemoglobin 12.9.  She was given Dicyclomine 20 mg to take by mouth twice daily.    Today, the patient presents to clinic accompanied by her mother who does assist with history.  They explained this all started on Wednesday, 08/06/2022 on patient's flight back from the [facility] for being worked up further for POTS.  She describes it started with excruciating lower abdominal pain which worsened on her car ride up the road and they ended up pulling over and calling an ambulance to take her to the hospital.  Her mom tells me she was told it was just period pain and that nothing was really wrong.  She then returned to the ED yesterday after the pain got worse and was again told nothing was really wrong.  Around 10:45 PM last night 08/07/2022 she passed a clot of blood in the toilet with no bowel movement.  Her abdominal pain started to radiate through to her back.  Just prior to time of arrival here around 3:00 pm she passed another blood clot and a lot of bright red blood/maroon-colored blood.  She feels weak and exhausted.  Does tell me that the Dicyclomine 20 mg helps.  Patient and mother are worried that symptoms will worsen over the weekend.  She did have stool studies while at the [facility] recently on 08/04/2022 which were negative including a GI pathogen panel and C. difficile as well as crypto and Giardia.  (She has these results on her phone).    Denies fever, chills or weight loss.  Past Medical History:  Diagnosis Date   Anal pain    Anxiety    Arthritis    Complex regional pain syndrome of both lower extremities    per pt w/ right thigh numbness   Depression  Ehlers-Danlos disease    Exercise-induced asthma    Fainting episodes    Fibromyalgia    GERD (gastroesophageal reflux disease)    History of concussion 04/2019   per pt no residual   History of Osgood-Schlatter disease    History of syncope    ED visit 08-29-2020 w/ cp, palpitations--- pt sent for cardiology evaluation , dr Quentin Ore, office note 09-07-2020 dx orthostatic intolerence ;   echo in epic 09-14-2020 , normal;  event monitor 09-13-2020 no signfiance arrhythmia , SR/ST;  CT angio chest 09-02-2020  normal   Irritable bowel    Migraine without aura    Orthostatic hypotension    POTS  (postural orthostatic tachycardia syndrome)    Sacral radiculopathy    Spondylolisthesis, lumbosacral region    L5--S1   Wears contact lenses     Past Surgical History:  Procedure Laterality Date   RECTAL EXAM UNDER ANESTHESIA N/A 11/22/2020   Procedure: ANAL  EXAM UNDER ANESTHESIA;  Surgeon: Leighton Ruff, MD;  Location: [facility];  Service: General;  Laterality: N/A;   SPHINCTEROTOMY N/A 11/22/2020   Procedure: CHEMICAL SPHINCTEROTOMY/ FISSURECTOMY;  Surgeon: Leighton Ruff, MD;  Location: [facility];  Service: General;  Laterality: N/A;   TONSILLECTOMY AND ADENOIDECTOMY  2010   WISDOM TOOTH EXTRACTION  2020    Current Outpatient Medications  Medication Sig Dispense Refill   AIMOVIG 140 MG/ML SOAJ      albuterol (PROVENTIL HFA;VENTOLIN HFA) 108 (90 Base) MCG/ACT inhaler Inhale 2 puffs into the lungs every 6 (six) hours as needed for wheezing or shortness of breath. 1 Inhaler 3   bisoprolol (ZEBETA) 5 MG tablet Take 1.5 tablets (7.5 mg total) by mouth daily. 135 tablet 2   botulinum toxin Type A (BOTOX) 200 units injection Inject 155 units IM into multiple site in the face,neck and head once every 90 days 1 each 4   buPROPion (WELLBUTRIN XL) 150 MG 24 hr tablet Take 1 tablet by mouth every morning.     buPROPion (WELLBUTRIN XL) 300 MG 24 hr tablet Take 1 tablet by mouth daily.     calcium carbonate (TUMS - DOSED IN MG ELEMENTAL CALCIUM) 500 MG chewable tablet Chew 1 tablet by mouth as needed for indigestion or heartburn.     dicyclomine (BENTYL) 20 MG tablet Take 20 mg by mouth 2 (two) times daily.     DULoxetine (CYMBALTA) 60 MG capsule Take 2 capsules (120 mg total) by mouth daily. 180 capsule 1   DULoxetine HCl 30 MG CSDR      folic acid (FOLVITE) 1 MG tablet      gabapentin (NEURONTIN) 600 MG tablet      levETIRAcetam (KEPPRA) 500 MG tablet      meloxicam (MOBIC) 7.5 MG tablet      montelukast (SINGULAIR) 10 MG tablet      norethindrone-ethinyl  estradiol (JUNEL 1/20) 1-20 MG-MCG tablet TAKE 1 TABLET BY MOUTH DAILY. TAKE CONTINUOUSLY, SKIP PLACEBO WEEK 28 tablet 0   ondansetron (ZOFRAN) 4 MG tablet      ondansetron (ZOFRAN-ODT) 8 MG disintegrating tablet Take 8 mg by mouth every 8 (eight) hours as needed.     predniSONE (STERAPRED UNI-PAK 21 TAB) 10 MG (21) TBPK tablet take 60mg  day 1, then 50mg  day 2, then 40mg  day 3, then 30mg  day 4, then 20mg  day 5, then 10mg  day 6, then STOP 21 tablet 0   pregabalin (LYRICA) 150 MG capsule      promethazine (PHENERGAN) 12.5  MG tablet Take 1 tablet (12.5 mg total) by mouth every 6 (six) hours as needed for nausea or vomiting. 90 tablet 5   rizatriptan (MAXALT-MLT) 10 MG disintegrating tablet Take 1 tablet earliest onset of migraine.  May repeat in 2 hours if needed.  Maximum 2 tablets in 24 hours. 10 tablet 5   SUMAtriptan (IMITREX) 50 MG tablet      tiZANidine (ZANAFLEX) 4 MG tablet TAKE 0.5-1 TABLETS (2-4 MG TOTAL) BY MOUTH 2 (TWO) TIMES DAILY AS NEEDED FOR MUSCLE SPASMS. 180 tablet 1   Current Facility-Administered Medications  Medication Dose Route Frequency Provider Last Rate Last Admin   0.9 %  sodium chloride infusion  500 mL Intravenous Once Armbruster, Carlota Raspberry, MD        Allergies as of 08/08/2022 - Review Complete 08/08/2022  Allergen Reaction Noted   Pecan extract allergy skin test Anaphylaxis 06/15/2018   Strawberry extract Anaphylaxis 06/15/2018   Other Hives 06/15/2018   Shellfish allergy  01/16/2021   Gluten meal  06/28/2020    Family History  Problem Relation Age of Onset   Arthritis Mother    Asthma Mother    Miscarriages / [place] Mother    Migraines Mother    COPD Father        per pt's mother-mild   Hypertension Father    Migraines Father    Asthma Brother    Migraines Brother    Anxiety disorder Brother    Arthritis Maternal Grandmother    Cancer Maternal Grandmother    Miscarriages / Stillbirths Maternal Grandmother    Diabetes Maternal Grandfather     Heart attack Maternal Grandfather    Heart disease Maternal Grandfather    Hyperlipidemia Maternal Grandfather    Hypertension Maternal Grandfather    Stroke Maternal Grandfather    Colon polyps Maternal Grandfather    Cancer Paternal Grandmother    Alcohol abuse Paternal Grandfather    Cancer Paternal Grandfather    COPD Paternal Grandfather    Heart attack Paternal Grandfather    Heart disease Paternal Grandfather    Hyperlipidemia Paternal Grandfather    Hypertension Paternal Grandfather    Asthma Brother    OCD Brother    Anxiety disorder Brother    Seizures Neg Hx    Depression Neg Hx    Bipolar disorder Neg Hx    Schizophrenia Neg Hx    ADD / ADHD Neg Hx    Autism Neg Hx    Colon cancer Neg Hx    Esophageal cancer Neg Hx    Rectal cancer Neg Hx    Stomach cancer Neg Hx     Social History   Socioeconomic History   Marital status: Married    Spouse name: Not on file   Number of children: Not on file   Years of education: Not on file   Highest education level: Not on file  Occupational History   Not on file  Tobacco Use   Smoking status: Never   Smokeless tobacco: Never  Vaping Use   Vaping Use: Never used  Substance and Sexual Activity   Alcohol use: Never   Drug use: Never   Sexual activity: Yes    Partners: Male    Birth control/protection: Pill  Other Topics Concern   Not on file  Social History Narrative   [name] is in the 12th grade at [employer]; she does well academically. She lives with her parents. She enjoys going to the gym, boxing, and playing with animals  Right handed   Drinks caffeine   An apartment one story    Social Determinants of Health   Financial Resource Strain: Not on file  Food Insecurity: Not on file  Transportation Needs: Not on file  Physical Activity: Not on file  Stress: Not on file  Social Connections: Not on file  Intimate Partner Violence: Not on file    Review of Systems:    Constitutional: No weight loss, fever or  chills Skin: No rash  Cardiovascular: No chest pain Respiratory: No SOB  Gastrointestinal: See HPI and otherwise negative Genitourinary: No dysuria Neurological: No headache, dizziness or syncope Musculoskeletal: No new muscle or joint pain Hematologic: No bruising Psychiatric: No history of depression or anxiety   Physical Exam:  Vital signs: BP 118/64   Pulse 73   Ht 5\' 7"  (1.702 m)   Wt 167 lb 2 oz (75.8 kg)   BMI 26.18 kg/m   Constitutional:   Pleasant ill appearing , pale Caucasian female appears to be in NAD, Well developed, Well nourished, alert and cooperative Head:  Normocephalic and atraumatic. Eyes:   PEERL, EOMI. No icterus. Conjunctiva pink. Ears:  Normal auditory acuity. Neck:  Supple Throat: Oral cavity and pharynx without inflammation, swelling or lesion.  Respiratory: Respirations even and unlabored. Lungs clear to auscultation bilaterally.   No wheezes, crackles, or rhonchi.  Cardiovascular: Normal S1, S2. No MRG. Regular rate and rhythm. No peripheral edema, cyanosis or pallor.  Gastrointestinal:  Soft, nondistended, marked LLQ ttp, mild epigastric ttp, No rebound or guarding. Normal bowel sounds. No appreciable masses or hepatomegaly. Rectal:  Not performed.  Msk:  Symmetrical without gross deformities. Without edema, no deformity or joint abnormality.  Neurologic:  Alert and  oriented x4;  grossly normal neurologically.  Skin:   Dry and intact without significant lesions or rashes. Psychiatric:  Demonstrates good judgement and reason without abnormal affect or behaviors.  See HPI for recent labs and imaging.  Assessment: 1.  Lower abdominal pain: With below, 10 /10, mostly on the left side 2.  Hematochezia: Blood clots over the past 12 hours, the first around 1045 yesterday evening and again around 3:00 today, started after severe pain as above, CT with question of colitis which has not been treated, recent stool studies all negative at the [facility];  consider most likely ischemic colitis versus less likely infectious versus less likely inflammatory bowel disease given recent colonoscopy last year  Plan: 1.  Advised the patient to go directly to the ER given her increasing weakness and now passing of blood clots overnight with increasing abdominal pain.  Concern for ischemic colitis versus other.  Discussed that they may want to repeat a CT to compare the 2 and possibly start her on antibiotics and at least admit her overnight to observe her for further bleeding.  Pending work-up she may require colonoscopy. 2.  I have discussed the above with inpatient team including Dr. Rush Landmark.  Please call our service when patient is admitted so that we can consult on her formally. 3.  Would recommend admission to the hospital for this patient given that she has been in the ER now 3 days in a row and her pain is increasing with now bloody stools. 4.  Patient to follow in clinic per recommendations after ER visit/hospitalization.  Ellouise Newer, PA-C [facility] 08/08/2022, 3:07 PM  Cc: Eula Fried, *

## 2022-08-08 NOTE — ED Triage Notes (Signed)
Patient reports that she has had rectal bleeding x 2 days. Patient reports that the blood is maroon and bright red in color. Patient went to [facility] today and patient was sent  to The ED today for admission so that she could have a full GI work up. Patient has a history of POTS and has a service dog with her.

## 2022-08-08 NOTE — Telephone Encounter (Signed)
Inbound call from patient mom inquiring if patient can take another muscle relaxer due to pain. Please advise .

## 2022-08-08 NOTE — Telephone Encounter (Signed)
The pt was seen today by Ellouise Newer and sent to the ER/

## 2022-08-08 NOTE — ED Notes (Signed)
Call from PCP stating pt has POTS and they would like her to be admitted for a full GI workup & not turned away in ED like they state she normally is.

## 2022-08-08 NOTE — ED Provider Triage Note (Signed)
Emergency Medicine Provider Triage Evaluation Note  Kiara Brewer , a 21 y.o. female  was evaluated in triage.  Pt complains of abd pain. Diffused abd pain with maroon color stools and clots x 3 days.  Has been seen at outside facility and had recent abd/pelvis CT showing colitis of either inflammatory or infectious in etiology.  GI recommend coming to the ER to be admit for further work up.  Report lightheadedness, denies dysuria, fever, chills  Review of Systems  Positive: As above Negative: As above  Physical Exam  BP 120/73 (BP Location: Right Arm)   Pulse 77   Temp 98.4 F (36.9 C) (Oral)   Resp 16   Ht 5\' 7"  (1.702 m)   Wt 75.8 kg   SpO2 100%   BMI 26.18 kg/m  Gen:   Awake, no distress   Resp:  Normal effort  MSK:   Moves extremities without difficulty  Other:    Medical Decision Making  Medically screening exam initiated at 4:59 PM.  Appropriate orders placed.  Kayda Allers was informed that the remainder of the evaluation will be completed by another provider, this initial triage assessment does not replace that evaluation, and the importance of remaining in the ED until their evaluation is complete.     Domenic Moras, PA-C 08/08/22 1709

## 2022-08-09 ENCOUNTER — Emergency Department (HOSPITAL_COMMUNITY): Payer: 59

## 2022-08-09 ENCOUNTER — Encounter (HOSPITAL_COMMUNITY): Payer: Self-pay

## 2022-08-09 DIAGNOSIS — E872 Acidosis, unspecified: Secondary | ICD-10-CM | POA: Diagnosis present

## 2022-08-09 DIAGNOSIS — R933 Abnormal findings on diagnostic imaging of other parts of digestive tract: Secondary | ICD-10-CM

## 2022-08-09 DIAGNOSIS — M5418 Radiculopathy, sacral and sacrococcygeal region: Secondary | ICD-10-CM | POA: Diagnosis present

## 2022-08-09 DIAGNOSIS — G8929 Other chronic pain: Secondary | ICD-10-CM | POA: Diagnosis present

## 2022-08-09 DIAGNOSIS — R109 Unspecified abdominal pain: Secondary | ICD-10-CM | POA: Diagnosis not present

## 2022-08-09 DIAGNOSIS — Z818 Family history of other mental and behavioral disorders: Secondary | ICD-10-CM | POA: Diagnosis not present

## 2022-08-09 DIAGNOSIS — K922 Gastrointestinal hemorrhage, unspecified: Secondary | ICD-10-CM | POA: Diagnosis present

## 2022-08-09 DIAGNOSIS — Z91014 Allergy to mammalian meats: Secondary | ICD-10-CM | POA: Diagnosis not present

## 2022-08-09 DIAGNOSIS — K589 Irritable bowel syndrome without diarrhea: Secondary | ICD-10-CM | POA: Diagnosis present

## 2022-08-09 DIAGNOSIS — K559 Vascular disorder of intestine, unspecified: Secondary | ICD-10-CM | POA: Diagnosis present

## 2022-08-09 DIAGNOSIS — K921 Melena: Secondary | ICD-10-CM

## 2022-08-09 DIAGNOSIS — Z8261 Family history of arthritis: Secondary | ICD-10-CM | POA: Diagnosis not present

## 2022-08-09 DIAGNOSIS — G90A Postural orthostatic tachycardia syndrome (POTS): Secondary | ICD-10-CM | POA: Diagnosis present

## 2022-08-09 DIAGNOSIS — Z825 Family history of asthma and other chronic lower respiratory diseases: Secondary | ICD-10-CM | POA: Diagnosis not present

## 2022-08-09 DIAGNOSIS — D62 Acute posthemorrhagic anemia: Secondary | ICD-10-CM | POA: Diagnosis present

## 2022-08-09 DIAGNOSIS — Z87820 Personal history of traumatic brain injury: Secondary | ICD-10-CM | POA: Diagnosis not present

## 2022-08-09 DIAGNOSIS — R1030 Lower abdominal pain, unspecified: Secondary | ICD-10-CM

## 2022-08-09 DIAGNOSIS — K219 Gastro-esophageal reflux disease without esophagitis: Secondary | ICD-10-CM | POA: Diagnosis present

## 2022-08-09 DIAGNOSIS — Z91013 Allergy to seafood: Secondary | ICD-10-CM | POA: Diagnosis not present

## 2022-08-09 DIAGNOSIS — K625 Hemorrhage of anus and rectum: Secondary | ICD-10-CM | POA: Diagnosis present

## 2022-08-09 DIAGNOSIS — M797 Fibromyalgia: Secondary | ICD-10-CM | POA: Diagnosis present

## 2022-08-09 DIAGNOSIS — Q796 Ehlers-Danlos syndrome, unspecified: Secondary | ICD-10-CM | POA: Diagnosis not present

## 2022-08-09 DIAGNOSIS — F32A Depression, unspecified: Secondary | ICD-10-CM | POA: Diagnosis present

## 2022-08-09 DIAGNOSIS — J45990 Exercise induced bronchospasm: Secondary | ICD-10-CM | POA: Diagnosis present

## 2022-08-09 DIAGNOSIS — M1990 Unspecified osteoarthritis, unspecified site: Secondary | ICD-10-CM | POA: Diagnosis present

## 2022-08-09 DIAGNOSIS — Z79899 Other long term (current) drug therapy: Secondary | ICD-10-CM | POA: Diagnosis not present

## 2022-08-09 DIAGNOSIS — M4317 Spondylolisthesis, lumbosacral region: Secondary | ICD-10-CM | POA: Diagnosis present

## 2022-08-09 DIAGNOSIS — F419 Anxiety disorder, unspecified: Secondary | ICD-10-CM | POA: Diagnosis present

## 2022-08-09 DIAGNOSIS — G43009 Migraine without aura, not intractable, without status migrainosus: Secondary | ICD-10-CM | POA: Diagnosis present

## 2022-08-09 DIAGNOSIS — Z91018 Allergy to other foods: Secondary | ICD-10-CM | POA: Diagnosis not present

## 2022-08-09 LAB — ABO/RH: ABO/RH(D): A POS

## 2022-08-09 LAB — IRON AND TIBC
Iron: 107 ug/dL (ref 28–170)
Saturation Ratios: 35 % — ABNORMAL HIGH (ref 10.4–31.8)
TIBC: 307 ug/dL (ref 250–450)
UIBC: 200 ug/dL

## 2022-08-09 LAB — HEMOGLOBIN AND HEMATOCRIT, BLOOD
HCT: 32 % — ABNORMAL LOW (ref 36.0–46.0)
HCT: 26.5 % — ABNORMAL LOW (ref 36.0–46.0)
HCT: 29.3 % — ABNORMAL LOW (ref 36.0–46.0)
Hemoglobin: 11.9 g/dL — ABNORMAL LOW (ref 12.0–15.0)
Hemoglobin: 9.6 g/dL — ABNORMAL LOW (ref 12.0–15.0)
Hemoglobin: 10.4 g/dL — ABNORMAL LOW (ref 12.0–15.0)

## 2022-08-09 LAB — LACTIC ACID, PLASMA
Lactic Acid, Venous: 0.8 mmol/L (ref 0.5–1.9)
Lactic Acid, Venous: 2.1 mmol/L (ref 0.5–1.9)

## 2022-08-09 LAB — C-REACTIVE PROTEIN: CRP: 1.7 mg/dL — ABNORMAL HIGH (ref <1.0)

## 2022-08-09 LAB — SEDIMENTATION RATE: Sed Rate: 7 mm/hr (ref 0–22)

## 2022-08-09 MED ORDER — ALBUTEROL SULFATE HFA 108 (90 BASE) MCG/ACT IN AERS: 2.0000 | INHALATION_SPRAY | Freq: Four times a day (QID) | RESPIRATORY_TRACT | Status: DC | PRN

## 2022-08-09 MED ORDER — TIZANIDINE HCL 4 MG PO TABS: 2.0000 mg | ORAL_TABLET | Freq: Two times a day (BID) | ORAL | Status: DC | PRN

## 2022-08-09 MED ORDER — ONDANSETRON HCL 4 MG/2ML IJ SOLN
4.0000 mg | Freq: Once | INTRAMUSCULAR | Status: AC
  Administered 2022-08-09: 4 mg via INTRAVENOUS
  Filled 2022-08-09: qty 2

## 2022-08-09 MED ORDER — MORPHINE SULFATE (PF) 4 MG/ML IV SOLN
4.0000 mg | Freq: Once | INTRAVENOUS | Status: AC
  Administered 2022-08-09: 4 mg via INTRAVENOUS
  Filled 2022-08-09: qty 1

## 2022-08-09 MED ORDER — BUPROPION HCL ER (XL) 300 MG PO TB24
300.0000 mg | ORAL_TABLET | Freq: Every day | ORAL | Status: DC
  Administered 2022-08-10 – 2022-08-11 (×2): 300 mg via ORAL
  Filled 2022-08-09 (×2): qty 1

## 2022-08-09 MED ORDER — ONDANSETRON HCL 4 MG/2ML IJ SOLN
4.0000 mg | Freq: Four times a day (QID) | INTRAMUSCULAR | Status: DC | PRN
  Administered 2022-08-10: 4 mg via INTRAVENOUS
  Filled 2022-08-09: qty 2

## 2022-08-09 MED ORDER — LACTATED RINGERS IV BOLUS
1000.0000 mL | Freq: Once | INTRAVENOUS | Status: AC
  Administered 2022-08-09: 1000 mL via INTRAVENOUS

## 2022-08-09 MED ORDER — ALBUTEROL SULFATE (2.5 MG/3ML) 0.083% IN NEBU: 2.5000 mg | INHALATION_SOLUTION | Freq: Four times a day (QID) | RESPIRATORY_TRACT | Status: DC | PRN

## 2022-08-09 MED ORDER — DULOXETINE HCL 60 MG PO CPEP
60.0000 mg | ORAL_CAPSULE | Freq: Every day | ORAL | Status: DC
  Administered 2022-08-09 – 2022-08-11 (×3): 60 mg via ORAL
  Filled 2022-08-09 (×3): qty 1

## 2022-08-09 MED ORDER — ONDANSETRON HCL 4 MG PO TABS: 4.0000 mg | ORAL_TABLET | Freq: Four times a day (QID) | ORAL | Status: DC | PRN

## 2022-08-09 MED ORDER — IOHEXOL 300 MG/ML  SOLN
100.0000 mL | Freq: Once | INTRAMUSCULAR | Status: AC | PRN
  Administered 2022-08-09: 100 mL via INTRAVENOUS

## 2022-08-09 MED ORDER — DICYCLOMINE HCL 10 MG PO CAPS
10.0000 mg | ORAL_CAPSULE | Freq: Three times a day (TID) | ORAL | Status: DC
  Administered 2022-08-09 – 2022-08-11 (×8): 10 mg via ORAL
  Filled 2022-08-09 (×8): qty 1

## 2022-08-09 MED ORDER — IOHEXOL 9 MG/ML PO SOLN
ORAL | Status: AC
  Administered 2022-08-09: 500 mL
  Filled 2022-08-09: qty 1000

## 2022-08-09 MED ORDER — MORPHINE SULFATE (PF) 2 MG/ML IV SOLN
2.0000 mg | INTRAVENOUS | Status: DC | PRN
  Administered 2022-08-09: 2 mg via INTRAVENOUS
  Filled 2022-08-09: qty 1

## 2022-08-09 NOTE — H&P (Addendum)
History and Physical    Patient: Kiara Brewer EVO:[id number] DOB: [birth year] DOA: 08/08/2022 DOS: the patient was seen and examined on 08/09/2022 PCP: Alberteen Sam, FNP  Patient coming from: Home  Chief Complaint:  Chief Complaint  Patient presents with   Abdominal Pain   Rectal Bleeding   HPI: Kiara Brewer is a 21 y.o. female with medical history significant of Ehlers-Danlos, asthma, depression/anxiety, POTS. Presenting with w/ abdominal pain and rectal bleeding. She reports that Wednesday she began having lower abdomen, sharp pain. It intermittently radiated to her left back. She went to an outside ED for evaluation. Imaging there showed colitis in the transverse and proximal descending colon. She was given pain control and sent home with GI follow up. Her symptoms returned the next day, so she made another visit to an outside ED. She was given bentyl and told to follow up with GI. Yesterday she went to her GI specialist with the additional complaint of rectal bleeding. They recommended that she come to the ED for evaluation. She denies any other aggravating or alleviating factors.    Review of Systems: As mentioned in the history of present illness. All other systems reviewed and are negative. Past Medical History:  Diagnosis Date   Anal pain    Anxiety    Arthritis    Complex regional pain syndrome of both lower extremities    per pt w/ right thigh numbness   Depression    Ehlers-Danlos disease    Exercise-induced asthma    Fainting episodes    Fibromyalgia    GERD (gastroesophageal reflux disease)    History of concussion 04/2019   per pt no residual   History of Osgood-Schlatter disease    History of syncope    ED visit 08-29-2020 w/ cp, palpitations--- pt sent for cardiology evaluation , dr Lalla Brothers, office note 09-07-2020 dx orthostatic intolerence ;   echo in epic 09-14-2020 , normal;  event monitor 09-13-2020 no signfiance arrhythmia , SR/ST;  CT angio chest  09-02-2020  normal   Irritable bowel    Migraine without aura    Orthostatic hypotension    POTS (postural orthostatic tachycardia syndrome)    Sacral radiculopathy    Spondylolisthesis, lumbosacral region    L5--S1   Wears contact lenses    Past Surgical History:  Procedure Laterality Date   RECTAL EXAM UNDER ANESTHESIA N/A 11/22/2020   Procedure: ANAL  EXAM UNDER ANESTHESIA;  Surgeon: Romie Levee, MD;  Location: [facility];  Service: General;  Laterality: N/A;   SPHINCTEROTOMY N/A 11/22/2020   Procedure: CHEMICAL SPHINCTEROTOMY/ FISSURECTOMY;  Surgeon: Romie Levee, MD;  Location: [facility];  Service: General;  Laterality: N/A;   TONSILLECTOMY AND ADENOIDECTOMY  2010   WISDOM TOOTH EXTRACTION  2020   Social History:  reports that she has never smoked. She has never used smokeless tobacco. She reports that she does not drink alcohol and does not use drugs.  Allergies  Allergen Reactions   Pecan Extract Allergy Skin Test Anaphylaxis   Strawberry Extract Anaphylaxis   Other Hives    Red Meat  and chicken   Shellfish Allergy     lobster Other reaction(s): Other lobster   Gluten Meal     Family History  Problem Relation Age of Onset   Arthritis Mother    Asthma Mother    Miscarriages / [place] Mother    Migraines Mother    COPD Father        per pt's mother-mild  Hypertension Father    Migraines Father    Asthma Brother    Migraines Brother    Anxiety disorder Brother    Arthritis Maternal Grandmother    Cancer Maternal Grandmother    Miscarriages / Stillbirths Maternal Grandmother    Diabetes Maternal Grandfather    Heart attack Maternal Grandfather    Heart disease Maternal Grandfather    Hyperlipidemia Maternal Grandfather    Hypertension Maternal Grandfather    Stroke Maternal Grandfather    Colon polyps Maternal Grandfather    Cancer Paternal Grandmother    Alcohol abuse Paternal Grandfather    Cancer Paternal  Grandfather    COPD Paternal Grandfather    Heart attack Paternal Grandfather    Heart disease Paternal Grandfather    Hyperlipidemia Paternal Grandfather    Hypertension Paternal Grandfather    Asthma Brother    OCD Brother    Anxiety disorder Brother    Seizures Neg Hx    Depression Neg Hx    Bipolar disorder Neg Hx    Schizophrenia Neg Hx    ADD / ADHD Neg Hx    Autism Neg Hx    Colon cancer Neg Hx    Esophageal cancer Neg Hx    Rectal cancer Neg Hx    Stomach cancer Neg Hx     Prior to Admission medications   Medication Sig Start Date End Date Taking? Authorizing Provider  albuterol (PROVENTIL HFA;VENTOLIN HFA) 108 (90 Base) MCG/ACT inhaler Inhale 2 puffs into the lungs every 6 (six) hours as needed for wheezing or shortness of breath. 12/15/18  Yes Orland MustardWolfe, Allison, MD  bisoprolol (ZEBETA) 5 MG tablet Take 1.5 tablets (7.5 mg total) by mouth daily. 03/20/22  Yes Duke SalviaKlein, Steven C, MD  botulinum toxin Type A (BOTOX) 200 units injection Inject 155 units IM into multiple site in the face,neck and head once every 90 days 05/16/22  Yes Jaffe, Adam R, DO  buPROPion (WELLBUTRIN XL) 300 MG 24 hr tablet Take 1 tablet by mouth daily. 04/21/22  Yes [provider]  calcium carbonate (TUMS - DOSED IN MG ELEMENTAL CALCIUM) 500 MG chewable tablet Chew 1 tablet by mouth as needed for indigestion or heartburn.   Yes [provider]  dicyclomine (BENTYL) 20 MG tablet Take 20 mg by mouth 4 (four) times daily as needed for spasms. 08/07/22  Yes [provider]  DULoxetine (CYMBALTA) 60 MG capsule Take 2 capsules (120 mg total) by mouth daily. Patient taking differently: Take 60 mg by mouth daily. 02/21/22  Yes Lovorn, Aundra MilletMegan, MD  ketoconazole (NIZORAL) 2 % shampoo Apply 1 Application topically 3 (three) times a week. 07/14/22  Yes [provider]  norethindrone-ethinyl estradiol (JUNEL 1/20) 1-20 MG-MCG tablet TAKE 1 TABLET BY MOUTH DAILY. TAKE CONTINUOUSLY, SKIP PLACEBO  WEEK 08/04/22  Yes Romualdo BolkJertson, Jill Evelyn, MD  ondansetron (ZOFRAN-ODT) 8 MG disintegrating tablet Take 8 mg by mouth every 8 (eight) hours as needed for nausea or vomiting. 03/19/22  Yes [provider]  tiZANidine (ZANAFLEX) 4 MG tablet TAKE 0.5-1 TABLETS (2-4 MG TOTAL) BY MOUTH 2 (TWO) TIMES DAILY AS NEEDED FOR MUSCLE SPASMS. 06/10/21  Yes Lovorn, Aundra MilletMegan, MD  ARIPiprazole (ABILIFY) 2 MG tablet Take 2 mg by mouth daily. 08/05/22   [provider]  FLUoxetine (PROZAC) 10 MG capsule Take 10 mg by mouth daily. 08/05/22   [provider]  hydrOXYzine (VISTARIL) 25 MG capsule Take 25 mg by mouth daily as needed for anxiety. 08/05/22   [provider]  predniSONE (STERAPRED UNI-PAK 21 TAB) 10 MG (21) TBPK tablet take 60mg  day 1, then 50mg  day 2, then 40mg  day 3, then 30mg  day 4, then 20mg  day 5, then 10mg  day 6, then STOP Patient not taking: Reported on 08/09/2022 04/16/22   Pieter Partridge, DO  promethazine (PHENERGAN) 12.5 MG tablet Take 1 tablet (12.5 mg total) by mouth every 6 (six) hours as needed for nausea or vomiting. Patient not taking: Reported on 08/09/2022 10/02/21   Pieter Partridge, DO  rizatriptan (MAXALT-MLT) 10 MG disintegrating tablet Take 1 tablet earliest onset of migraine.  May repeat in 2 hours if needed.  Maximum 2 tablets in 24 hours. Patient not taking: Reported on 08/09/2022 10/02/21   Pieter Partridge, DO    Physical Exam: Vitals:   08/09/22 0435 08/09/22 0500 08/09/22 0600 08/09/22 0705  BP: (!) 102/45 132/73 (!) 96/47   Pulse: 88 92 82   Resp:  18 17   Temp:    97.9 F (36.6 C)  TempSrc:    Oral  SpO2: 95% 99% 95%   Weight:      Height:       General: 21 y.o. female resting in bed in NAD Eyes: PERRL, normal sclera ENMT: Nares patent w/o discharge, orophaynx clear, dentition normal, ears w/o discharge/lesions/ulcers Neck: Supple, trachea midline Cardiovascular: RRR, +S1, S2, no m/g/r, equal pulses throughout Respiratory: CTABL, no w/r/r,  normal WOB GI: BS+, ND, mild TTP LLQ/LUQ, no masses noted, no organomegaly noted MSK: No e/c/c Neuro: A&O x 3, no focal deficits Psyc: Appropriate interaction and affect, calm/cooperative  Data Reviewed:  Results for orders placed or performed during the hospital encounter of 08/08/22 (from the past 24 hour(s))  CBC with Differential     Status: Abnormal   Collection Time: 08/08/22  6:04 PM  Result Value Ref Range   WBC 7.1 4.0 - 10.5 K/uL   RBC 3.21 (L) 3.87 - 5.11 MIL/uL   Hemoglobin 10.6 (L) 12.0 - 15.0 g/dL   HCT 29.6 (L) 36.0 - 46.0 %   MCV 92.2 80.0 - 100.0 fL   MCH 33.0 26.0 - 34.0 pg   MCHC 35.8 30.0 - 36.0 g/dL   RDW 11.5 11.5 - 15.5 %   Platelets 249 150 - 400 K/uL   nRBC 0.0 0.0 - 0.2 %   Neutrophils Relative % 55 %   Neutro Abs 4.0 1.7 - 7.7 K/uL   Lymphocytes Relative 35 %   Lymphs Abs 2.5 0.7 - 4.0 K/uL   Monocytes Relative 7 %   Monocytes Absolute 0.5 0.1 - 1.0 K/uL   Eosinophils Relative 2 %   Eosinophils Absolute 0.1 0.0 - 0.5 K/uL   Basophils Relative 1 %   Basophils Absolute 0.0 0.0 - 0.1 K/uL   Immature Granulocytes 0 %   Abs Immature Granulocytes 0.01 0.00 - 0.07 K/uL  Comprehensive metabolic panel     Status: Abnormal   Collection Time: 08/08/22  6:04 PM  Result Value Ref Range   Sodium 135 135 - 145 mmol/L   Potassium 3.9 3.5 - 5.1 mmol/L   Chloride 104 98 - 111 mmol/L   CO2 25 22 - 32 mmol/L   Glucose, Bld 90 70 - 99 mg/dL   BUN 8 6 - 20 mg/dL   Creatinine, Ser 0.86 0.44 - 1.00 mg/dL   Calcium 9.0 8.9 - 10.3 mg/dL   Total Protein 6.1 (L) 6.5 - 8.1 g/dL   Albumin 3.7 3.5 - 5.0 g/dL  AST 18 15 - 41 U/L   ALT 20 0 - 44 U/L   Alkaline Phosphatase 30 (L) 38 - 126 U/L   Total Bilirubin 0.6 0.3 - 1.2 mg/dL   GFR, Estimated >89 >38 mL/min   Anion gap 6 5 - 15  Lipase, blood     Status: None   Collection Time: 08/08/22  6:04 PM  Result Value Ref Range   Lipase 32 11 - 51 U/L  Type and screen     Status: None   Collection Time: 08/08/22  6:04  PM  Result Value Ref Range   ABO/RH(D) A POS    Antibody Screen NEG    Sample Expiration      08/11/2022,2359 Performed at [facility], [address], [place], [place] [address]   Urinalysis, Routine w reflex microscopic     Status: Abnormal   Collection Time: 08/08/22  7:41 PM  Result Value Ref Range   Color, Urine STRAW (A) YELLOW   APPearance CLEAR CLEAR   Specific Gravity, Urine 1.004 (L) 1.005 - 1.030   pH 6.0 5.0 - 8.0   Glucose, UA NEGATIVE NEGATIVE mg/dL   Hgb urine dipstick NEGATIVE NEGATIVE   Bilirubin Urine NEGATIVE NEGATIVE   Ketones, ur NEGATIVE NEGATIVE mg/dL   Protein, ur NEGATIVE NEGATIVE mg/dL   Nitrite NEGATIVE NEGATIVE   Leukocytes,Ua NEGATIVE NEGATIVE  Pregnancy, urine     Status: None   Collection Time: 08/08/22  7:41 PM  Result Value Ref Range   Preg Test, Ur NEGATIVE NEGATIVE  Lactic acid, plasma     Status: None   Collection Time: 08/09/22  2:34 AM  Result Value Ref Range   Lactic Acid, Venous 0.8 0.5 - 1.9 mmol/L  Hemoglobin and hematocrit, blood     Status: Abnormal   Collection Time: 08/09/22  2:34 AM  Result Value Ref Range   Hemoglobin 11.9 (L) 12.0 - 15.0 g/dL   HCT 10.2 (L) 58.5 - 27.7 %  ABO/Rh     Status: None   Collection Time: 08/09/22  2:46 AM  Result Value Ref Range   ABO/RH(D)      A POS Performed at [facility], [address] [place], [place] [address]   Lactic acid, plasma     Status: Abnormal   Collection Time: 08/09/22  5:00 AM  Result Value Ref Range   Lactic Acid, Venous 2.1 (HH) 0.5 - 1.9 mmol/L   CT ab/pelvis No acute intra-abdominal process.  Assessment and Plan: Rectal bleeding Abdominal pain     - placed in obs, progressive     - [facility] consulted by EDP, appreciate assistance     - she has not had a BM in 3 days, however, she is passing clots     - right now will continue fluids     - CT negative     - not febrile, WBC is normal; she has had multiple pathogen panels w/  [facility] recently; right now will hold off on abx     - keep her NPO until seen by GI     - she has a C diff and GI PCR pending     - check iron levels     - q6h H&H, transfuse for Hgb < 7  Lactic acidosis     - mild; fluids, follow  Fibromyalgia     - continue home regimen when off NPO status  Anxiety Depression     - continue home regimen  when off NPO status  POTS     - continue home regimen when off NPO status  Advance Care Planning:   Code Status: FULL  Consults: [facility] consulted by EDP  Family Communication: w/ mother at bedside  Severity of Illness: The appropriate patient status for this patient is OBSERVATION. Observation status is judged to be reasonable and necessary in order to provide the required intensity of service to ensure the patient's safety. The patient's presenting symptoms, physical exam findings, and initial radiographic and laboratory data in the context of their medical condition is felt to place them at decreased risk for further clinical deterioration. Furthermore, it is anticipated that the patient will be medically stable for discharge from the hospital within 2 midnights of admission.   Author: Teddy Spike, DO 08/09/2022 7:31 AM  For on call review [email or web address].

## 2022-08-09 NOTE — ED Provider Notes (Signed)
[facility] DEPT Provider Note   CSN: [id number] Arrival date & time: 08/08/22  1543     History  Chief Complaint  Patient presents with   Abdominal Pain   Rectal Bleeding    Kiara Brewer is a 21 y.o. female.  The history is provided by the patient.  Abdominal Pain Associated symptoms: hematochezia   Rectal Bleeding Associated symptoms: abdominal pain   She comes here because of crampy lower abdominal pain and rectal bleeding for the last 2 days.  Symptoms started when she arrived in [place] after going to the [facility] for evaluation of several medical conditions.  She was seen in emergency department in [place] where CT scan is reported to have shown a focal colitis.  Symptoms have persisted and she continued to have crampy domino pain and bleeding.  Blood has been maroon to bright red.  She was seen in an emergency department in [place] and was told that she should see a gastroenterologist.  She saw PA at [facility] gastroenterology today who directed her to the emergency department.  She states that today she has started passing clots.  Abdominal pain continues to be crampy in and not changing compared with onset 2 days ago.  She denies fever, chills, sweats.  There has been some intermittent nausea.  Of note, she did have an extensive panel for pathogens done at [facility] which is reported to have been negative.   Home Medications Prior to Admission medications   Medication Sig Start Date End Date Taking? Authorizing Provider  AIMOVIG 140 MG/ML SOAJ  03/19/22   [provider]  albuterol (PROVENTIL HFA;VENTOLIN HFA) 108 (90 Base) MCG/ACT inhaler Inhale 2 puffs into the lungs every 6 (six) hours as needed for wheezing or shortness of breath. 12/15/18   Orma Flaming, MD  bisoprolol (ZEBETA) 5 MG tablet Take 1.5 tablets (7.5 mg total) by mouth daily. 03/20/22   Deboraha Sprang, MD  botulinum toxin Type A (BOTOX) 200 units injection  Inject 155 units IM into multiple site in the face,neck and head once every 90 days 05/16/22   Pieter Partridge, DO  buPROPion (WELLBUTRIN XL) 150 MG 24 hr tablet Take 1 tablet by mouth every morning. 05/17/22   [provider]  buPROPion (WELLBUTRIN XL) 300 MG 24 hr tablet Take 1 tablet by mouth daily. 04/21/22   [provider]  calcium carbonate (TUMS - DOSED IN MG ELEMENTAL CALCIUM) 500 MG chewable tablet Chew 1 tablet by mouth as needed for indigestion or heartburn.    [provider]  dicyclomine (BENTYL) 20 MG tablet Take 20 mg by mouth 2 (two) times daily. 08/07/22   [provider]  DULoxetine (CYMBALTA) 60 MG capsule Take 2 capsules (120 mg total) by mouth daily. 02/21/22   Lovorn, Jinny Blossom, MD  DULoxetine HCl 30 MG CSDR     [provider]  folic acid (FOLVITE) 1 MG tablet     [provider]  gabapentin (NEURONTIN) 600 MG tablet     [provider]  levETIRAcetam (KEPPRA) 500 MG tablet     [provider]  meloxicam (MOBIC) 7.5 MG tablet     [provider]  montelukast (SINGULAIR) 10 MG tablet     [provider]  norethindrone-ethinyl estradiol (JUNEL 1/20) 1-20 MG-MCG tablet TAKE 1 TABLET BY MOUTH DAILY. TAKE CONTINUOUSLY, SKIP PLACEBO WEEK 08/04/22   Salvadore Dom, MD  ondansetron [facility]) 4 MG tablet     [provider]  ondansetron (ZOFRAN-ODT) 8 MG disintegrating tablet Take 8 mg by mouth every 8 (eight) hours as needed. 03/19/22   [provider]  predniSONE (STERAPRED UNI-PAK 21 TAB) 10 MG (21) TBPK tablet take 60mg  day 1, then 50mg  day 2, then 40mg  day 3, then 30mg  day 4, then 20mg  day 5, then 10mg  day 6, then STOP 04/16/22   Jaffe, Adam R, DO  pregabalin (LYRICA) 150 MG capsule     [provider]  promethazine (PHENERGAN) 12.5 MG tablet Take 1 tablet (12.5 mg total) by mouth every 6 (six) hours as needed for nausea or vomiting. 10/02/21   Pieter Partridge, DO   rizatriptan (MAXALT-MLT) 10 MG disintegrating tablet Take 1 tablet earliest onset of migraine.  May repeat in 2 hours if needed.  Maximum 2 tablets in 24 hours. 10/02/21   Pieter Partridge, DO  SUMAtriptan (IMITREX) 50 MG tablet     [provider]  tiZANidine (ZANAFLEX) 4 MG tablet TAKE 0.5-1 TABLETS (2-4 MG TOTAL) BY MOUTH 2 (TWO) TIMES DAILY AS NEEDED FOR MUSCLE SPASMS. 06/10/21   Lovorn, Jinny Blossom, MD      Allergies    Pecan extract allergy skin test, Strawberry extract, Other, Shellfish allergy, and Gluten meal    Review of Systems   Review of Systems  Gastrointestinal:  Positive for abdominal pain and hematochezia.  All other systems reviewed and are negative.   Physical Exam Updated Vital Signs BP 120/73 (BP Location: Right Arm)   Pulse 77   Temp 98.4 F (36.9 C) (Oral)   Resp 16   Ht 5\' 7"  (1.702 m)   Wt 75.8 kg   SpO2 100%   BMI 26.18 kg/m  Physical Exam Vitals and nursing note reviewed.   21 year old female, resting comfortably and in no acute distress. Vital signs are normal. Oxygen saturation is 100%, which is normal. Head is normocephalic and atraumatic. PERRLA, EOMI. Oropharynx is clear. Neck is nontender and supple without adenopathy or JVD. Back is nontender and there is no CVA tenderness. Lungs are clear without rales, wheezes, or rhonchi. Chest is nontender. Heart has regular rate and rhythm without murmur. Abdomen is soft, flat, with mild tenderness across the suprapubic area.  There is no focal tenderness.  There is no rebound or guarding.  Peristalsis is hypoactive. Extremities have no cyanosis or edema, full range of motion is present. Skin is warm and dry without rash. Neurologic: Mental status is normal, cranial nerves are intact, moves all extremities equally.  ED Results / Procedures / Treatments   Labs (all labs ordered are listed, but only abnormal results are displayed) Labs Reviewed  CBC WITH DIFFERENTIAL/PLATELET - Abnormal; Notable for  the following components:      Result Value   RBC 3.21 (*)    Hemoglobin 10.6 (*)    HCT 29.6 (*)    All other components within normal limits  COMPREHENSIVE METABOLIC PANEL - Abnormal; Notable for the following components:   Total Protein 6.1 (*)    Alkaline Phosphatase 30 (*)    All other components within normal limits  URINALYSIS, ROUTINE W REFLEX MICROSCOPIC - Abnormal; Notable for the following components:   Color, Urine STRAW (*)    Specific Gravity, Urine 1.004 (*)    All other components within normal limits  LIPASE, BLOOD  PREGNANCY, URINE  TYPE AND SCREEN  ABO/RH   Radiology No results found.  Procedures Procedures    Medications Ordered in ED Medications  lactated ringers  bolus 1,000 mL (has no administration in time range)  ondansetron (ZOFRAN) injection 4 mg (4 mg Intravenous Given 08/09/22 0248)  lactated ringers bolus 1,000 mL (0 mLs Intravenous Stopped 08/09/22 0520)  iohexol (OMNIPAQUE) 9 MG/ML oral solution (500 mLs  Contrast Given 08/09/22 0200)  iohexol (OMNIPAQUE) 300 MG/ML solution 100 mL (100 mLs Intravenous Contrast Given 08/09/22 0408)  morphine (PF) 4 MG/ML injection 4 mg (4 mg Intravenous Given 08/09/22 0520)    ED Course/ Medical Decision Making/ A&P                           Medical Decision Making Amount and/or Complexity of Data Reviewed Labs: ordered. Radiology: ordered.  Risk Prescription drug management. Decision regarding hospitalization.   Lower abdominal pain with rectal bleeding and work-up at another facility showing evidence of colitis.  I have reviewed and interpreted her laboratory test, and my interpretation is mild anemia and otherwise normal CBC and comprehensive metabolic panel.  On review of old records, hemoglobin had been 12.9 on 08/07/2022 which means that she has had a 2.3 g drop since then.  This is clearly a significant bleeding episode.  Most likely differential is inflammatory bowel disease versus AV  malformation.  Doubt infectious colitis given multiple negative pathogen studies done at [facility] the day before symptoms started.  I have ordered a repeat hemoglobin and a CT of abdomen and pelvis as well as IV fluids.  CT scan shows no acute process.  I have independently viewed the images, and agree with radiologist's interpretation.  I discussed the case with Dr. Marlowe Sax of [facility] hospital[facility], who agrees to admit the patient.  I have also sent a secure chat to Dr. Allison Quarry of [facility] gastroenterology who states he will see the patient in consultation.  He requests quested him to seal and GI panel be obtained and these are ordered.  Repeat ED labs show an increase in lactic acid and also an increase in hemoglobin.  I have ordered additional IV fluids.  CRITICAL CARE Performed by: Delora Fuel Total critical care time: 40 minutes Critical care time was exclusive of separately billable procedures and treating other patients. Critical care was necessary to treat or prevent imminent or life-threatening deterioration. Critical care was time spent personally by me on the following activities: development of treatment plan with patient and/or surrogate as well as nursing, discussions with consultants, evaluation of patient's response to treatment, examination of patient, obtaining history from patient or surrogate, ordering and performing treatments and interventions, ordering and review of laboratory studies, ordering and review of radiographic studies, pulse oximetry and re-evaluation of patient's condition.  Final Clinical Impression(s) / ED Diagnoses Final diagnoses:  Rectal bleeding    Rx / DC Orders ED Discharge Orders     None         Delora Fuel, MD [id number] [phone]

## 2022-08-09 NOTE — Consult Note (Signed)
Consultation Note   Referring Provider: [facility] PCP: Eula Fried, FNP Primary Gastroenterologist:  Cellar, MD Reason for consultation: hematochezia, abdominal pain and colitis on recent CT scan   Hospital Day: 2  Assessment / Plan   # 21 yo female with anxiety, depression, asthma, fibromyalgia, chronic pain, Osgood Schlatter disease, POTS, GERD, history of anal fissure admitted with severe lower abdominal pain and intermittent hematochezia over last few days. CT scan on 10/11 suggesting possible segmental infectious of inflammatory colitis involving distal transverse to proximal sigmoid colon. However, evaluation was limited due to poor distention of most of the bowel. Follow up CTAP this am was normal.  Unclear if she has ischemic colitis though would have expected some residual inflammation on today's CT scan. Infectious etiology seems unlikely given only one episode of diarrhea a few days ago. IBD unlikely give abrupt onset of symptoms and normal CT scan. No evidence for IBD on colonoscopy in April 2022 Trial of clears.  May need flexible sigmoidoscopy for evaluation of bleeding if it persists but not likely to be done today  Monitor hgb Bentyl Ac and HS Stool studies were ordered but no BMs in last few days.   Check ESR, CRP   # See PMH for additional medical problems   HPI   Kiara Brewer is a 21 y.o. female with a past medical history significant for anxiety, depression, asthma, fibromyalgia, chronic pain, Osgood Schlatter disease, POTS, GERD, anal fissure See PMH for any additional medical problems.  08/08/22 office visit with Ellouise Newer, PA Seen for evaluation of abdominal pain and hematochezia, please refer to that note for details. In summary, she had flown to [facility] for evaluation of POTS. On the way home, after flight landed in [place] on 10/11 she began having severe lower  abdominal pain . Went to ED ( [facility]) where she had a loose bloody BM. WBC was normal. Hgb was 14.4.   CTAP showed  segmental versus infectious inflammatory colitis in the transverse and proximal descending colon. Preg test negative. She went back to ED on 08/07/22 for worsening pain. WBC was normal. Hgb was 12.9. Discharged home on  Bentyl.  We sent [name] to ED yesterday from our office for ongoing hematochezia. Bentyl does help her pain   Of note, she did have stool studies at [facility] as part of infectious workup. She had not been having diarrhea.  GI path panel, cryptosporidium and giardia which were all negative.  She hasn't had any further diarrhea or any BMs since the episode of loose stool in ED on 10/11. She has continued to have nausea and episodes of lower abdominal pain and hematochezia with clots, especially if she eats. CTAP w contrast this am without any acute findings. No bowel wall thickening seen. Her WBC remains normal. Hgb is 11.9 (down about 1.5 grams from baseline of 13.5).  LMP was ~ 4 years ago. On birth control.   No known [facility] of IBD  Previous GI Evaluation    April 2022 Colonoscopy for rectal bleeding, altered bowel habits, abdominal pain.  - Anal fissure found on perianal exam. - The examined portion of the ileum was normal. - Internal hemorrhoids. - Anal papilla(e) were hypertrophied. - The  examination was otherwise normal. - Biopsies were taken with a cold forceps from the right colon, left colon and transverse colon for evaluation of microscopic colitis.  April 2022 EGD for abdominal pain, suspected GERD - LA Grade A reflux esophagitis. - Normal esophagus otherwise - empiric dilation performed to 63m and biopsies obtained. - Normal stomach. Biopsied. - Normal duodenal bulb and second portion of the duodenum. Biopsied.  Surgical [P], small bowel bx's - PEPTIC DUODENITIS - NO DYSPLASIA OR MALIGNANCY IDENTIFIED 2. Surgical [P], gastric antrum and gastric  body - REACTIVE GASTROPATHY - NO H. PYLORI OR INTESTINAL METAPLASIA IDENTIFIED - SEE COMMENT 3. Surgical [P], esophageal - REACTIVE SQUAMOUS MUCOSA - NO INCREASED INTRAEPITHELIAL EOSINOPHILS 4. Surgical [P], colon nos, random sites - BENIGN COLONIC MUCOSA - NO ACTIVE INFLAMMATION OR EVIDENCE OF MICROSCOPIC COLITIS - NO HIGH GRADE DYSPLASIA OR MALIGNANCY IDENTIFIED    Recent Labs and Imaging CT ABDOMEN PELVIS W CONTRAST  Result Date: 08/09/2022 CLINICAL DATA:  Lower GI bleed, rectal bleeding for 2 days with abdominal pain and nausea. EXAM: CT ABDOMEN AND PELVIS WITH CONTRAST TECHNIQUE: Multidetector CT imaging of the abdomen and pelvis was performed using the standard protocol following bolus administration of intravenous contrast. RADIATION DOSE REDUCTION: This exam was performed according to the departmental dose-optimization program which includes automated exposure control, adjustment of the mA and/or kV according to patient size and/or use of iterative reconstruction technique. CONTRAST:  1044mOMNIPAQUE IOHEXOL 300 MG/ML  SOLN COMPARISON:  None Available. FINDINGS: Lower chest: No acute abnormality. Hepatobiliary: No focal liver abnormality is seen. No gallstones, gallbladder wall thickening, or biliary dilatation. Pancreas: Unremarkable. No pancreatic ductal dilatation or surrounding inflammatory changes. Spleen: Normal in size without focal abnormality. Adrenals/Urinary Tract: Adrenal glands are unremarkable. Kidneys are normal, without renal calculi, focal lesion, or hydronephrosis. Bladder is unremarkable. Stomach/Bowel: Stomach is within normal limits. Appendix is not seen. No evidence of bowel wall thickening, distention, or inflammatory changes. No free air or pneumatosis. Vascular/Lymphatic: No significant vascular findings are present. No enlarged abdominal or pelvic lymph nodes. Reproductive: Uterus and bilateral adnexa are unremarkable. Other: No abdominal wall hernia or  abnormality. No abdominopelvic ascites. 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 IMPRESSION: No acute intra-abdominal process. Electronically Signed   By: LaBrett Fairy.D.   On: 08/09/2022 04:34    Labs:  Recent Labs    08/08/22 1804 08/09/22 0234  WBC 7.1  --   HGB 10.6* 11.9*  HCT 29.6* 32.0*  PLT 249  --    Recent Labs    08/08/22 1804  NA 135  K 3.9  CL 104  CO2 25  GLUCOSE 90  BUN 8  CREATININE 0.86  CALCIUM 9.0   Recent Labs    08/08/22 1804  PROT 6.1*  ALBUMIN 3.7  AST 18  ALT 20  ALKPHOS 30*  BILITOT 0.6   No results for input(s): "HEPBSAG", "HCVAB", "HEPAIGM", "HEPBIGM" in the last 72 hours. No results for input(s): "LABPROT", "INR" in the last 72 hours.  Past Medical History:  Diagnosis Date   Anal pain    Anxiety    Arthritis    Complex regional pain syndrome of both lower extremities    per pt w/ right thigh numbness   Depression    Ehlers-Danlos disease    Exercise-induced asthma    Fainting episodes    Fibromyalgia    GERD (gastroesophageal reflux disease)    History of concussion 04/2019   per pt no residual  History of Osgood-Schlatter disease    History of syncope    ED visit 08-29-2020 w/ cp, palpitations--- pt sent for cardiology evaluation , dr Quentin Ore, office note 09-07-2020 dx orthostatic intolerence ;   echo in epic 09-14-2020 , normal;  event monitor 09-13-2020 no signfiance arrhythmia , SR/ST;  CT angio chest 09-02-2020  normal   Irritable bowel    Migraine without aura    Orthostatic hypotension    POTS (postural orthostatic tachycardia syndrome)    Sacral radiculopathy    Spondylolisthesis, lumbosacral region    L5--S1   Wears contact lenses     Past Surgical History:  Procedure Laterality Date   RECTAL EXAM UNDER ANESTHESIA N/A 11/22/2020   Procedure: ANAL  EXAM UNDER ANESTHESIA;  Surgeon: Leighton Ruff, MD;  Location: [place];  Service: General;  Laterality:  N/A;   SPHINCTEROTOMY N/A 11/22/2020   Procedure: CHEMICAL SPHINCTEROTOMY/ FISSURECTOMY;  Surgeon: Leighton Ruff, MD;  Location: [place];  Service: General;  Laterality: N/A;   TONSILLECTOMY AND ADENOIDECTOMY  2010   WISDOM TOOTH EXTRACTION  2020    Family History  Problem Relation Age of Onset   Arthritis Mother    Asthma Mother    Miscarriages / [place] Mother    Migraines Mother    COPD Father        per pt's mother-mild   Hypertension Father    Migraines Father    Asthma Brother    Migraines Brother    Anxiety disorder Brother    Arthritis Maternal Grandmother    Cancer Maternal Grandmother    Miscarriages / Stillbirths Maternal Grandmother    Diabetes Maternal Grandfather    Heart attack Maternal Grandfather    Heart disease Maternal Grandfather    Hyperlipidemia Maternal Grandfather    Hypertension Maternal Grandfather    Stroke Maternal Grandfather    Colon polyps Maternal Grandfather    Cancer Paternal Grandmother    Alcohol abuse Paternal Grandfather    Cancer Paternal Grandfather    COPD Paternal Grandfather    Heart attack Paternal Grandfather    Heart disease Paternal Grandfather    Hyperlipidemia Paternal Grandfather    Hypertension Paternal Grandfather    Asthma Brother    OCD Brother    Anxiety disorder Brother    Seizures Neg Hx    Depression Neg Hx    Bipolar disorder Neg Hx    Schizophrenia Neg Hx    ADD / ADHD Neg Hx    Autism Neg Hx    Colon cancer Neg Hx    Esophageal cancer Neg Hx    Rectal cancer Neg Hx    Stomach cancer Neg Hx     Prior to Admission medications   Medication Sig Start Date End Date Taking? Authorizing Provider  albuterol (PROVENTIL HFA;VENTOLIN HFA) 108 (90 Base) MCG/ACT inhaler Inhale 2 puffs into the lungs every 6 (six) hours as needed for wheezing or shortness of breath. 12/15/18  Yes Orma Flaming, MD  bisoprolol (ZEBETA) 5 MG tablet Take 1.5 tablets (7.5 mg total) by mouth daily. 03/20/22  Yes  Deboraha Sprang, MD  botulinum toxin Type A (BOTOX) 200 units injection Inject 155 units IM into multiple site in the face,neck and head once every 90 days 05/16/22  Yes Jaffe, Adam R, DO  buPROPion (WELLBUTRIN XL) 300 MG 24 hr tablet Take 1 tablet by mouth daily. 04/21/22  Yes [provider]  calcium carbonate (TUMS - DOSED IN MG ELEMENTAL CALCIUM) 500 MG  chewable tablet Chew 1 tablet by mouth as needed for indigestion or heartburn.   Yes [provider]  dicyclomine (BENTYL) 20 MG tablet Take 20 mg by mouth 4 (four) times daily as needed for spasms. 08/07/22  Yes [provider]  DULoxetine (CYMBALTA) 60 MG capsule Take 2 capsules (120 mg total) by mouth daily. Patient taking differently: Take 60 mg by mouth daily. 02/21/22  Yes Lovorn, Jinny Blossom, MD  ketoconazole (NIZORAL) 2 % shampoo Apply 1 Application topically 3 (three) times a week. 07/14/22  Yes [provider]  norethindrone-ethinyl estradiol (JUNEL 1/20) 1-20 MG-MCG tablet TAKE 1 TABLET BY MOUTH DAILY. TAKE CONTINUOUSLY, SKIP PLACEBO WEEK 08/04/22  Yes Salvadore Dom, MD  ondansetron (ZOFRAN-ODT) 8 MG disintegrating tablet Take 8 mg by mouth every 8 (eight) hours as needed for nausea or vomiting. 03/19/22  Yes [provider]  tiZANidine (ZANAFLEX) 4 MG tablet TAKE 0.5-1 TABLETS (2-4 MG TOTAL) BY MOUTH 2 (TWO) TIMES DAILY AS NEEDED FOR MUSCLE SPASMS. 06/10/21  Yes Lovorn, Jinny Blossom, MD  ARIPiprazole (ABILIFY) 2 MG tablet Take 2 mg by mouth daily. 08/05/22   [provider]  FLUoxetine (PROZAC) 10 MG capsule Take 10 mg by mouth daily. 08/05/22   [provider]  hydrOXYzine (VISTARIL) 25 MG capsule Take 25 mg by mouth daily as needed for anxiety. 08/05/22   [provider]  predniSONE (STERAPRED UNI-PAK 21 TAB) 10 MG (21) TBPK tablet take 25m day 1, then 541mday 2, then 4045may 3, then 56m40my 4, then 20mg11m 5, then 10mg 64m6, then STOP Patient not taking: Reported on  08/09/2022 04/16/22   Jaffe,Pieter Partridgepromethazine (PHENERGAN) 12.5 MG tablet Take 1 tablet (12.5 mg total) by mouth every 6 (six) hours as needed for nausea or vomiting. Patient not taking: Reported on 08/09/2022 10/02/21   Jaffe,Pieter Partridgerizatriptan (MAXALT-MLT) 10 MG disintegrating tablet Take 1 tablet earliest onset of migraine.  May repeat in 2 hours if needed.  Maximum 2 tablets in 24 hours. Patient not taking: Reported on 08/09/2022 10/02/21   Jaffe,Pieter Partridge  Current Facility-Administered Medications  Medication Dose Route Frequency Provider Last Rate Last Admin   albuterol (PROVENTIL) (2.5 MG/3ML) 0.083% nebulizer solution 2.5 mg  2.5 mg Nebulization Q6H PRN Kyle, Marylyn Ishiharane A, DO       morphine (PF) 2 MG/ML injection 2 mg  2 mg Intravenous Q2H PRN Kyle, Marylyn Ishiharane A, DO       ondansetron (ZOFRAN) tablet 4 mg  4 mg Oral Q6H PRN Kyle, Marylyn Ishiharane A, DO       Or   ondansetron (ZOFRAN) injection 4 mg  4 mg Intravenous Q6H PRN Kyle, Marylyn Ishiharane A, DO        Allergies as of 08/08/2022 - Review Complete 08/08/2022  Allergen Reaction Noted   Pecan extract allergy skin test Anaphylaxis 06/15/2018   Strawberry extract Anaphylaxis 06/15/2018   Other Hives 06/15/2018   Shellfish allergy  01/16/2021   Gluten meal  06/28/2020    Social History   Socioeconomic History   Marital status: Married    Spouse name: Not on file   Number of children: Not on file   Years of education: Not on file   Highest education level: Not on file  Occupational History   Not on file  Tobacco Use   Smoking status: Never   Smokeless tobacco: Never  Vaping Use   Vaping Use: Never used  Substance  and Sexual Activity   Alcohol use: Never   Drug use: Never   Sexual activity: Yes    Partners: Male    Birth control/protection: Pill  Other Topics Concern   Not on file  Social History Narrative   [name] is in the 12th grade at [employer]; she does well academically. She lives with her parents. She enjoys going to the  gym, boxing, and playing with animals   Right handed   Drinks caffeine   An apartment one story    Social Determinants of Health   Financial Resource Strain: Not on file  Food Insecurity: No Food Insecurity (08/09/2022)   Hunger Vital Sign    Worried About Running Out of Food in the Last Year: Never true    Ran Out of Food in the Last Year: Never true  Transportation Needs: No Transportation Needs (08/09/2022)   PRAPARE - Hydrologist (Medical): No    Lack of Transportation (Non-Medical): No  Physical Activity: Not on file  Stress: Not on file  Social Connections: Not on file  Intimate Partner Violence: Not At Risk (08/09/2022)   Humiliation, Afraid, Rape, and Kick questionnaire    Fear of Current or Ex-Partner: No    Emotionally Abused: No    Physically Abused: No    Sexually Abused: No    Review of Systems: All systems reviewed and negative except where noted in HPI.  Physical Exam: Vital signs in last 24 hours: Temp:  [97.9 F (36.6 C)-98.4 F (36.9 C)] 98.1 F (36.7 C) (10/14 0829) Pulse Rate:  [73-92] 78 (10/14 0829) Resp:  [16-18] 18 (10/14 0829) BP: (91-132)/(45-79) 113/59 (10/14 0829) SpO2:  [95 %-100 %] 100 % (10/14 0829) Weight:  [75.8 kg] 75.8 kg (10/13 1646) Last BM Date : 08/08/22  General:  Alert female in NAD Psych:  Pleasant, cooperative. Normal mood and affect Eyes: Pupils equal Ears:  Normal auditory acuity Nose: No deformity, discharge or lesions Neck:  Supple, no masses felt Lungs:  Clear to auscultation.  Heart:  Regular rate, regular rhythm. No lower extremity edema Abdomen:  Soft, nondistended, nontender, active bowel sounds, no masses felt Rectal :  Deferred Msk: Symmetrical without gross deformities.  Neurologic:  Alert, oriented, grossly normal neurologically Skin:  Intact without significant lesions.    Intake/Output from previous day: 10/13 0701 - 10/14 0700 In: 999 [IV Piggyback:999] Out: -   Intake/Output this shift:  No intake/output data recorded.    Principal Problem:   GI bleed Active Problems:   Fibromyalgia   Depression, recurrent (HCC)   Abdominal pain   Lactic acidosis    Tye Savoy, NP-C @  08/09/2022, 10:25 AM

## 2022-08-10 DIAGNOSIS — E872 Acidosis, unspecified: Secondary | ICD-10-CM | POA: Diagnosis not present

## 2022-08-10 DIAGNOSIS — D62 Acute posthemorrhagic anemia: Secondary | ICD-10-CM | POA: Diagnosis not present

## 2022-08-10 DIAGNOSIS — R109 Unspecified abdominal pain: Secondary | ICD-10-CM | POA: Diagnosis not present

## 2022-08-10 DIAGNOSIS — K922 Gastrointestinal hemorrhage, unspecified: Secondary | ICD-10-CM | POA: Diagnosis not present

## 2022-08-10 DIAGNOSIS — K625 Hemorrhage of anus and rectum: Secondary | ICD-10-CM

## 2022-08-10 DIAGNOSIS — K559 Vascular disorder of intestine, unspecified: Principal | ICD-10-CM

## 2022-08-10 LAB — COMPREHENSIVE METABOLIC PANEL
ALT: 19 U/L (ref 0–44)
AST: 16 U/L (ref 15–41)
Albumin: 3.6 g/dL (ref 3.5–5.0)
Alkaline Phosphatase: 33 U/L — ABNORMAL LOW (ref 38–126)
Anion gap: 5 (ref 5–15)
BUN: 5 mg/dL — ABNORMAL LOW (ref 6–20)
CO2: 26 mmol/L (ref 22–32)
Calcium: 9.1 mg/dL (ref 8.9–10.3)
Chloride: 111 mmol/L (ref 98–111)
Creatinine, Ser: 0.84 mg/dL (ref 0.44–1.00)
GFR, Estimated: >60 mL/min (ref >60)
Glucose, Bld: 91 mg/dL (ref 70–99)
Potassium: 3.9 mmol/L (ref 3.5–5.1)
Sodium: 142 mmol/L (ref 135–145)
Total Bilirubin: 0.7 mg/dL (ref 0.3–1.2)
Total Protein: 6.1 g/dL — ABNORMAL LOW (ref 6.5–8.1)

## 2022-08-10 LAB — CBC
HCT: 28.7 % — ABNORMAL LOW (ref 36.0–46.0)
Hemoglobin: 10.4 g/dL — ABNORMAL LOW (ref 12.0–15.0)
MCH: 33.1 pg (ref 26.0–34.0)
MCHC: 36.2 g/dL — ABNORMAL HIGH (ref 30.0–36.0)
MCV: 91.4 fL (ref 80.0–100.0)
Platelets: 222 10*3/uL (ref 150–400)
RBC: 3.14 MIL/uL — ABNORMAL LOW (ref 3.87–5.11)
RDW: 11.5 % (ref 11.5–15.5)
WBC: 5.1 10*3/uL (ref 4.0–10.5)
nRBC: 0 % (ref 0.0–0.2)

## 2022-08-10 LAB — LACTIC ACID, PLASMA: Lactic Acid, Venous: 0.7 mmol/L (ref 0.5–1.9)

## 2022-08-10 MED ORDER — OXYCODONE HCL 5 MG PO TABS
5.0000 mg | ORAL_TABLET | Freq: Four times a day (QID) | ORAL | Status: DC | PRN
  Administered 2022-08-10: 5 mg via ORAL
  Filled 2022-08-10: qty 1

## 2022-08-10 NOTE — Progress Notes (Signed)
Progress Note    Kiara Brewer   EPP:[id number]  DOB: 2000/12/28  DOA: 08/08/2022     1 PCP: Eula Fried, FNP  Initial CC: Abdominal pain, rectal bleeding  Hospital Course: Kiara Brewer is a 21 y.o. female with PMH Ehlers-Danlos, asthma, depression/anxiety, POTS.  She presented with rectal bleeding and abdominal pain.  Symptoms began around Wednesday.  There was some report of radiation of pain to her left back. She has undergone multiple work-ups recently outpatient including [facility] in [place]. She went to an outside ED for evaluation. Imaging there showed colitis in the transverse and proximal descending colon. She was given pain control and sent home with GI follow up. Her symptoms returned the next day, so she made another visit to an outside ED. She was given bentyl and told to follow up with GI. She went to her GI specialist with the additional complaint of rectal bleeding. They recommended that she come to the ED for evaluation.  Interval History:  No events overnight.  Mother present bedside this morning.  Patient having minimal abdominal pain.  Diet started back this morning per GI. Her biggest concern was developing further rectal bleeding when transitioning to solid foods.  Assessment and Plan:  Rectal bleeding Abdominal pain - differential wide but includes mesenteric ischemia given hx POTS and rectal bleeding assoc with eating as well vs IBD (negative scopes in 2022) vs less likely PUD given red blood per rectum  - appreciate GI assistance - we've recommended maintaining adequate hydration going forward - bentyl TIDAC + qhs per GI rec's - if does have future recurrent rectal bleeding, would benefit from CTA abdomen/pelvis to better evaluate for ischemia   Lactic acidosis - possibly due to hypoTN and/or hypovolemia   Fibromyalgia - continue cymbalta   Anxiety Depression - continue cymbalta   POTS - patient recommended to maintain BP log at home;  has only been checking HR - maintain adequate nutrition/hydration     Old records reviewed in assessment of this patient  Antimicrobials:   DVT prophylaxis:  SCDs Start: 08/09/22 0834   Code Status:   Code Status: Full Code  Mobility Assessment (last 72 hours)     Mobility Assessment     Row Name 08/09/22 1000           Does patient have an order for bedrest or is patient medically unstable No - Continue assessment       What is the highest level of mobility based on the progressive mobility assessment? Level 5 (Walks with assist in room/hall) - Balance while stepping forward/back and can walk in room with assist - Complete                Barriers to discharge: none Disposition Plan:  Home 1-2 days Status is: Inpt  Objective: Blood pressure 119/72, pulse 82, temperature 99.1 F (37.3 C), temperature source Oral, resp. rate 20, height 5\' 7"  (1.702 m), weight 75.8 kg, SpO2 99 %.  Examination:  Physical Exam Constitutional:      Appearance: Normal appearance.  HENT:     Head: Normocephalic and atraumatic.     Mouth/Throat:     Mouth: Mucous membranes are moist.  Eyes:     Extraocular Movements: Extraocular movements intact.  Cardiovascular:     Rate and Rhythm: Normal rate and regular rhythm.  Pulmonary:     Effort: Pulmonary effort is normal.     Breath sounds: Normal breath sounds.  Abdominal:  General: Bowel sounds are normal. There is no distension.     Palpations: Abdomen is soft.     Tenderness: There is no abdominal tenderness.  Musculoskeletal:        General: Normal range of motion.     Cervical back: Normal range of motion and neck supple.  Skin:    General: Skin is warm.  Neurological:     General: No focal deficit present.     Mental Status: She is alert.  Psychiatric:        Mood and Affect: Mood normal.      Consultants:  GI  Procedures:    Data Reviewed: Results for orders placed or performed during the hospital encounter  of 08/08/22 (from the past 24 hour(s))  Hemoglobin and hematocrit, blood     Status: Abnormal   Collection Time: 08/09/22  6:11 PM  Result Value Ref Range   Hemoglobin 10.4 (L) 12.0 - 15.0 g/dL   HCT 68.0 (L) 32.1 - 22.4 %  Comprehensive metabolic panel     Status: Abnormal   Collection Time: 08/10/22  1:07 AM  Result Value Ref Range   Sodium 142 135 - 145 mmol/L   Potassium 3.9 3.5 - 5.1 mmol/L   Chloride 111 98 - 111 mmol/L   CO2 26 22 - 32 mmol/L   Glucose, Bld 91 70 - 99 mg/dL   BUN 5 (L) 6 - 20 mg/dL   Creatinine, Ser 8.25 0.44 - 1.00 mg/dL   Calcium 9.1 8.9 - 00.3 mg/dL   Total Protein 6.1 (L) 6.5 - 8.1 g/dL   Albumin 3.6 3.5 - 5.0 g/dL   AST 16 15 - 41 U/L   ALT 19 0 - 44 U/L   Alkaline Phosphatase 33 (L) 38 - 126 U/L   Total Bilirubin 0.7 0.3 - 1.2 mg/dL   GFR, Estimated >70 >48 mL/min   Anion gap 5 5 - 15  CBC     Status: Abnormal   Collection Time: 08/10/22  1:07 AM  Result Value Ref Range   WBC 5.1 4.0 - 10.5 K/uL   RBC 3.14 (L) 3.87 - 5.11 MIL/uL   Hemoglobin 10.4 (L) 12.0 - 15.0 g/dL   HCT 88.9 (L) 16.9 - 45.0 %   MCV 91.4 80.0 - 100.0 fL   MCH 33.1 26.0 - 34.0 pg   MCHC 36.2 (H) 30.0 - 36.0 g/dL   RDW 38.8 82.8 - 00.3 %   Platelets 222 150 - 400 K/uL   nRBC 0.0 0.0 - 0.2 %  Lactic acid, plasma     Status: None   Collection Time: 08/10/22  1:07 AM  Result Value Ref Range   Lactic Acid, Venous 0.7 0.5 - 1.9 mmol/L    I have Reviewed nursing notes, Vitals, and Lab results since pt's last encounter. Pertinent lab results : see above I have ordered test including BMP, CBC, Mg I have reviewed the last note from staff over past 24 hours I have discussed pt's care plan and test results with nursing staff, case manager   LOS: 1 day   Lewie Chamber, MD [facility] 08/10/2022, 12:30 PM

## 2022-08-10 NOTE — Progress Notes (Signed)
Gastroenterology Inpatient Follow-up Note   PATIENT IDENTIFICATION  Kiara Brewer is a 21 y.o. female with a pmh significant for POTS, Osgood-Schlatter disease, fibromyalgia, MDD, anxiety, asthma, GERD, previous anal fissure.  Patient presented to the hospital with lower abdominal pain and bright red blood per rectum and clots. Hospital Day: 3  SUBJECTIVE  The patient's chart has been reviewed. The patient's labs were reviewed.  Her hemoglobin is stable. The patient is evaluated this morning at bedside with her husband. She required a single dose of IV morphine last night and is not having any abdominal discomfort currently.  She is taking the dicyclomine she is not clear if that has been helpful for her or not. She has not had any further bright red blood per rectum since yesterday. She tolerated clear liquids last night. She was n.p.o. this morning until evaluation by myself. She denies any fevers or chills. She is wondering when she will be able to go home.   OBJECTIVE  Scheduled Inpatient Medications:   buPROPion  300 mg Oral Daily   dicyclomine  10 mg Oral TID AC & HS   DULoxetine  60 mg Oral Daily   Continuous Inpatient Infusions:  PRN Inpatient Medications: albuterol, morphine injection, ondansetron **OR** ondansetron (ZOFRAN) IV, tiZANidine   Physical Examination  Temp:  [98.1 F (36.7 C)-99.1 F (37.3 C)] 99.1 F (37.3 C) (10/15 0900) Pulse Rate:  [74-92] 82 (10/15 0542) Resp:  [14-20] 20 (10/14 2130) BP: (100-126)/(59-73) 119/72 (10/15 0542) SpO2:  [99 %-100 %] 99 % (10/15 0542) Temp (24hrs), Avg:98.6 F (37 C), Min:98.1 F (36.7 C), Max:99.1 F (37.3 C)  Weight: 75.8 kg GEN: NAD and resting in bed, appears stated age, doesn't appear chronically ill, husband at bedside PSYCH: Cooperative, without pressured speech EYE: Conjunctivae pink, sclerae anicteric ENT: MMM CV: Nontachycardic RESP: No audible wheezing GI: NABS, soft, protuberant abdomen, without  rebound NT/ND, without rebound MSK/EXT: No lower extremity edema SKIN: No jaundice NEURO:  Alert & Oriented x 3, no focal deficits   Review of Data   Laboratory Studies   Recent Labs  Lab 08/10/22 0107  NA 142  K 3.9  CL 111  CO2 26  BUN 5*  CREATININE 0.84  GLUCOSE 91  CALCIUM 9.1   Recent Labs  Lab 08/10/22 0107  AST 16  ALT 19  ALKPHOS 33*    Recent Labs  Lab 08/08/22 1804 08/09/22 0234 08/10/22 0107  WBC 7.1  --  5.1  HGB 10.6*   < > 10.4*  HCT 29.6*   < > 28.7*  PLT 249  --  222   < > = values in this interval not displayed.   No results for input(s): "APTT", "INR" in the last 168 hours. Computed MELD 3.0 unavailable. Necessary lab results were not found in the last year. Computed MELD-Na unavailable. Necessary lab results were not found in the last year.   Imaging Studies  CT ABDOMEN PELVIS W CONTRAST  Result Date: 08/09/2022 CLINICAL DATA:  Lower GI bleed, rectal bleeding for 2 days with abdominal pain and nausea. EXAM: CT ABDOMEN AND PELVIS WITH CONTRAST TECHNIQUE: Multidetector CT imaging of the abdomen and pelvis was performed using the standard protocol following bolus administration of intravenous contrast. RADIATION DOSE REDUCTION: This exam was performed according to the departmental dose-optimization program which includes automated exposure control, adjustment of the mA and/or kV according to patient size and/or use of iterative reconstruction technique. CONTRAST:  164mL OMNIPAQUE IOHEXOL 300 MG/ML  SOLN COMPARISON:  None Available. FINDINGS: Lower chest: No acute abnormality. Hepatobiliary: No focal liver abnormality is seen. No gallstones, gallbladder wall thickening, or biliary dilatation. Pancreas: Unremarkable. No pancreatic ductal dilatation or surrounding inflammatory changes. Spleen: Normal in size without focal abnormality. Adrenals/Urinary Tract: Adrenal glands are unremarkable. Kidneys are normal, without renal calculi, focal lesion, or  hydronephrosis. Bladder is unremarkable. Stomach/Bowel: Stomach is within normal limits. Appendix is not seen. No evidence of bowel wall thickening, distention, or inflammatory changes. No free air or pneumatosis. Vascular/Lymphatic: No significant vascular findings are present. No enlarged abdominal or pelvic lymph nodes. Reproductive: Uterus and bilateral adnexa are unremarkable. Other: No abdominal wall hernia or abnormality. No abdominopelvic ascites. 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 IMPRESSION: No acute intra-abdominal process. Electronically Signed   By: Thornell Sartorius M.D.   On: 08/09/2022 04:34    GI Procedures and Studies  No new relevant studies to review   ASSESSMENT  Ms. Benedict is a 21 y.o. female with a pmh significant for POTS, Osgood-Schlatter disease, fibromyalgia, MDD, anxiety, asthma, GERD, previous anal fissure.  Patient presented to the hospital with lower abdominal pain and bright red blood per rectum and clots.  The patient is hemodynamically stable at this time.  I am still suspicious that the etiology of her symptoms was ischemic colitis.  Thankfully has not had any further overt bleeding for almost 24 hours.  We will let her advance her diet and continue to support her with fluids as necessary.  I think she can be transitioned to an oral pain medicine if possible to use as needed for short period of time.  We will continue the dicyclomine for the next few days but in the long-term probably worth coming off to minimize risk of constipation issues.  Unless she has overt significant amount of recurrent bleeding, hopefully will not need an endoscopic reevaluation.  As she is not having any diarrhea or bowel movements for 24 hours, I will remove the C. difficile stool study (I was able to see that this was just done at [facility] and negative) and remove the enteric contact precautions for now.  We will advance her diet and see  how she does.  The risks and benefits of endoscopic evaluation were discussed with the patient; these include but are not limited to the risk of perforation, infection, bleeding, missed lesions, lack of diagnosis, severe illness requiring hospitalization, as well as anesthesia and sedation related illnesses.  The patient and/or family is agreeable to proceed.    PLAN/RECOMMENDATIONS  Removed C. difficile and GI pathogen panel and enteric contact precautions Trend hemoglobin hematocrit  Full liquid diet to be initiated this morning IV fluids for support/maintenance if not tolerating advancement of diet No plans for endoscopic evaluation at this time Appreciate medicine service working with patient in regards to pain medication utilization and transition to orals if possible Continue dicyclomine before every meal plus nightly for 1 week and then discontinue Follow-up will be arranged when she is discharged with GI as needed   Please page/call with questions or concerns.   Corliss Parish, MD [facility] Office # [phone]    LOS: 1 day  [facility]  08/10/2022, 9:44 AM

## 2022-08-10 NOTE — TOC Progression Note (Signed)
Transition of Care [facility]) - Progression Note    Patient Details  Name: Annaelle Kasel MRN: [id number] Date of Birth: Jan 16, 2001  Transition of Care [facility]) CM/SW Contact  Henrietta Dine, RN Phone Number: 08/10/2022, 9:23 AM  Clinical Narrative:      Transition of Care [facility]) Screening Note   Patient Details  Name: Julyana Woolverton Date of Birth: 17-Aug-2001   Transition of Care [facility]) CM/SW Contact:    Henrietta Dine, RN Phone Number: 08/10/2022, 9:23 AM    Transition of Care Department [facility]) has reviewed patient and no TOC needs have been identified at this time. We will continue to monitor patient advancement through interdisciplinary progression rounds. If new patient transition needs arise, please place a TOC consult.         Expected Discharge Plan and Services                                                 Social Determinants of Health (SDOH) Interventions Housing Interventions: Intervention Not Indicated  Readmission Risk Interventions     No data to display

## 2022-08-10 NOTE — Hospital Course (Signed)
Kiara Brewer is a 21 y.o. female with PMH Ehlers-Danlos, asthma, depression/anxiety, POTS.  She presented with rectal bleeding and abdominal pain.  Symptoms began around Wednesday.  There was some report of radiation of pain to her left back. She has undergone multiple work-ups recently outpatient including [facility] in [place]. She went to an outside ED for evaluation. Imaging there showed colitis in the transverse and proximal descending colon. She was given pain control and sent home with GI follow up. Her symptoms returned the next day, so she made another visit to an outside ED. She was given bentyl and told to follow up with GI. She went to her GI specialist with the additional complaint of rectal bleeding. They recommended that she come to the ED for evaluation.

## 2022-08-11 ENCOUNTER — Telehealth: Payer: Self-pay

## 2022-08-11 DIAGNOSIS — K921 Melena: Secondary | ICD-10-CM

## 2022-08-11 DIAGNOSIS — K625 Hemorrhage of anus and rectum: Secondary | ICD-10-CM

## 2022-08-11 DIAGNOSIS — K922 Gastrointestinal hemorrhage, unspecified: Secondary | ICD-10-CM | POA: Diagnosis not present

## 2022-08-11 DIAGNOSIS — R109 Unspecified abdominal pain: Secondary | ICD-10-CM | POA: Diagnosis not present

## 2022-08-11 DIAGNOSIS — R1030 Lower abdominal pain, unspecified: Secondary | ICD-10-CM | POA: Diagnosis not present

## 2022-08-11 LAB — CBC
HCT: 32.6 % — ABNORMAL LOW (ref 36.0–46.0)
Hemoglobin: 11.7 g/dL — ABNORMAL LOW (ref 12.0–15.0)
MCH: 32.7 pg (ref 26.0–34.0)
MCHC: 35.9 g/dL (ref 30.0–36.0)
MCV: 91.1 fL (ref 80.0–100.0)
Platelets: 268 10*3/uL (ref 150–400)
RBC: 3.58 MIL/uL — ABNORMAL LOW (ref 3.87–5.11)
RDW: 11.3 % — ABNORMAL LOW (ref 11.5–15.5)
WBC: 3.6 10*3/uL — ABNORMAL LOW (ref 4.0–10.5)
nRBC: 0 % (ref 0.0–0.2)

## 2022-08-11 LAB — VITAMIN D 25 HYDROXY (VIT D DEFICIENCY, FRACTURES): Vit D, 25-Hydroxy: 30.19 ng/mL (ref 30–100)

## 2022-08-11 MED ORDER — DICYCLOMINE HCL 10 MG PO CAPS: 10.0000 mg | ORAL_CAPSULE | Freq: Three times a day (TID) | ORAL | 1 refills | Status: DC

## 2022-08-11 NOTE — Telephone Encounter (Signed)
-----   Message from Vladimir Crofts, [place] sent at 08/11/2022 10:53 AM EDT ----- Regarding: Please set up a hospital follow up! Thanks! Patient of Dr. Havery Moros, can we set up OV 6-8 weeks with me or Dr. Luvenia Starch and recheck CBC, CMET 4 weeks?  Thanks!

## 2022-08-11 NOTE — Progress Notes (Signed)
Progress Note  Primary GI: Dr. Havery Moros   Subjective  Chief Complaint: hematochezia, abdominal pain   Patient lying in bed, mother at bedside.  Small-volume stool yesterday per nurse look like old blood.  Last episode of bleeding was Saturday in the ER. She continues have some nausea however states she also has this with her POTS diagnosis was difficult to ascertain. Pain is much improved, patient is eating, states she is ready to go home.    Objective   Vital signs in last 24 hours: Temp:  [98.4 F (36.9 C)-99.1 F (37.3 C)] 98.5 F (36.9 C) (10/16 0502) Pulse Rate:  [78-103] 97 (10/16 0502) Resp:  [15-20] 15 (10/16 0502) BP: (98-125)/(58-65) 110/58 (10/16 0502) SpO2:  [100 %] 100 % (10/16 0502) Last BM Date : 08/10/22 Last BM recorded by nurses in past 5 days Stool Type: Type 1 (Separate hard lumps) (08/10/2022  3:00 PM)  General:   female in no acute distress  Heart:  Regular rate and rhythm; no murmurs Pulm: Clear anteriorly; no wheezing Abdomen:  Soft, Flat AB, Active bowel sounds. No tenderness . , No organomegaly appreciated. Extremities:  without  edema. Neurologic:  Alert and  oriented x4;  No focal deficits.  Psych:  Cooperative. Normal mood and affect.  Intake/Output from previous day: 10/15 0701 - 10/16 0700 In: 480 [P.O.:480] Out: -  Intake/Output this shift: No intake/output data recorded.  Studies/Results: No results found.  Lab Results: Recent Labs    08/08/22 1804 08/09/22 0234 08/09/22 1135 08/09/22 1811 08/10/22 0107  WBC 7.1  --   --   --  5.1  HGB 10.6*   < > 9.6* 10.4* 10.4*  HCT 29.6*   < > 26.5* 29.3* 28.7*  PLT 249  --   --   --  222   < > = values in this interval not displayed.   BMET Recent Labs    08/08/22 1804 08/10/22 0107  NA 135 142  K 3.9 3.9  CL 104 111  CO2 25 26  GLUCOSE 90 91  BUN 8 5*  CREATININE 0.86 0.84  CALCIUM 9.0 9.1   LFT Recent Labs    08/10/22 0107  PROT 6.1*  ALBUMIN 3.6  AST 16  ALT  19  ALKPHOS 33*  BILITOT 0.7   PT/INR No results for input(s): "LABPROT", "INR" in the last 72 hours.   Scheduled Meds:  buPROPion  300 mg Oral Daily   dicyclomine  10 mg Oral TID AC & HS   DULoxetine  60 mg Oral Daily   Continuous Infusions:    Patient profile:   21 year old female with history of anxiety, depression, fibromyalgia, chronic pain, ehlers0danlos, GERD, anal fissure, POTS presents with severe lower abdominal pain and intermittent hematochezia.   Impression/Plan:   Abdominal pain and intermittent hematochezia 2022 EGD and colonoscopy without evidence of IBD 08/06/2022 CT abdomen pelvis with contrast segmental infectious or inflammatory colitis involving distal transverse, proximal sigmoid colon 08/09/2022 the abdomen pelvis with contrast unremarkable follow-up 08/05/2019 pathogen stool unremarkable, Giardia, Cryptosporidium additive Suspect episode of resolving ischemic bowel, pain improved today no bleeding since Saturday Small bowel movement yesterday with old appearing blood. We will recheck CBC today We will arrange follow-up with her office with Dr. Havery Moros or one of the APP's Advised to keep bowels loose, can add on MiraLAX, avoid constipation.  Keep hydrated.  Anemia 08/10/2022   HGB 10.4 MCV 91.4 Platelets 222 Iron 107  B12 390  POTS Continue follow  up cardiology Stay hydrated   Principal Problem:   GI bleed Active Problems:   Fibromyalgia   Depression, recurrent (HCC)   Abdominal pain   Lactic acidosis   GIB (gastrointestinal bleeding)    LOS: 2 days   Kiara Brewer  08/11/2022, 8:59 AM

## 2022-08-11 NOTE — Discharge Summary (Signed)
Physician Discharge Summary   Kiara Brewer WEX:[id number] DOB: [birth year] DOA: 08/08/2022  PCP: Eula Fried, FNP  Admit date: 08/08/2022 Discharge date: 08/11/2022  Barriers to discharge: none  Admitted From: Home Disposition:  Home Discharging physician: Dwyane Dee, MD  Recommendations for Outpatient Follow-up:  Follow-up with GI Review blood pressure log Consider CTA abdomen/pelvis if recurrent rectal bleeding  Home Health:  Equipment/Devices:   Discharge Condition: stable CODE STATUS: Full Diet recommendation:  Diet Orders (From admission, onward)     Start     Ordered   08/11/22 0000  Diet general        08/11/22 1054   08/10/22 1145  Diet Heart Room service appropriate? Yes; Fluid consistency: Thin  Diet effective now       Question Answer Comment  Room service appropriate? Yes   Fluid consistency: Thin      08/10/22 1145            Hospital Course: Kiara Brewer is a 21 y.o. female with PMH Ehlers-Danlos, asthma, depression/anxiety, POTS.  She presented with rectal bleeding and abdominal pain.  Symptoms began around Wednesday.  There was some report of radiation of pain to her left back. She has undergone multiple work-ups recently outpatient including [facility] in [place]. She went to an outside ED for evaluation. Imaging there showed colitis in the transverse and proximal descending colon. She was given pain control and sent home with GI follow up. Her symptoms returned the next day, so she made another visit to an outside ED. She was given bentyl and told to follow up with GI. She went to her GI specialist with the additional complaint of rectal bleeding. They recommended that she come to the ED for evaluation.  Assessment and Plan:  Rectal bleeding Abdominal pain - differential wide but includes mesenteric ischemia given hx POTS and rectal bleeding assoc with eating as well vs IBD (negative scopes in 2022) vs less likely PUD given red  blood per rectum  - appreciate GI assistance - we've recommended maintaining adequate hydration going forward - bentyl TIDAC + qhs per GI rec's - if does have future recurrent rectal bleeding, would benefit from CTA abdomen/pelvis to better evaluate for ischemia    Lactic acidosis - possibly due to hypoTN and/or hypovolemia   Fibromyalgia - continue cymbalta   Anxiety Depression - continue cymbalta   POTS - patient recommended to maintain BP log at home; has only been checking HR - maintain adequate nutrition/hydration     The patient's chronic medical conditions were treated accordingly per the patient's home medication regimen except as noted.  On day of discharge, patient was felt deemed stable for discharge. Patient/family member advised to call PCP or come back to ER if needed.   Principal Diagnosis: GI bleed  Discharge Diagnoses: Active Hospital Problems   Diagnosis Date Noted   GI bleed 08/09/2022   Hematochezia    Abdominal pain 08/09/2022   Lactic acidosis 08/09/2022   GIB (gastrointestinal bleeding) 08/09/2022   Depression, recurrent ([place]) 09/11/2020   Fibromyalgia     Resolved Hospital Problems  No resolved problems to display.     Discharge Instructions     Diet general   Complete by: As directed    Increase activity slowly   Complete by: As directed       Allergies as of 08/11/2022       Reactions   Pecan Extract Allergy Skin Test Anaphylaxis   Strawberry Extract Anaphylaxis   Other Hives  Red Meat  and chicken   Shellfish Allergy    lobster Other reaction(s): Other lobster   Gluten Meal         Medication List     STOP taking these medications    dicyclomine 20 MG tablet Commonly known as: BENTYL Replaced by: dicyclomine 10 MG capsule   predniSONE 10 MG (21) Tbpk tablet Commonly known as: STERAPRED UNI-PAK 21 TAB   promethazine 12.5 MG tablet Commonly known as: PHENERGAN   rizatriptan 10 MG disintegrating tablet Commonly  known as: Maxalt-MLT       TAKE these medications    albuterol 108 (90 Base) MCG/ACT inhaler Commonly known as: VENTOLIN HFA Inhale 2 puffs into the lungs every 6 (six) hours as needed for wheezing or shortness of breath.   ARIPiprazole 2 MG tablet Commonly known as: ABILIFY Take 2 mg by mouth daily.   bisoprolol 5 MG tablet Commonly known as: ZEBETA Take 1.5 tablets (7.5 mg total) by mouth daily.   Botox 200 units injection Generic drug: botulinum toxin Type A Inject 155 units IM into multiple site in the face,neck and head once every 90 days   buPROPion 300 MG 24 hr tablet Commonly known as: WELLBUTRIN XL Take 1 tablet by mouth daily.   calcium carbonate 500 MG chewable tablet Commonly known as: TUMS - dosed in mg elemental calcium Chew 1 tablet by mouth as needed for indigestion or heartburn.   dicyclomine 10 MG capsule Commonly known as: BENTYL Take 1 capsule (10 mg total) by mouth 4 (four) times daily -  before meals and at bedtime. Replaces: dicyclomine 20 MG tablet   DULoxetine 60 MG capsule Commonly known as: CYMBALTA Take 2 capsules (120 mg total) by mouth daily. What changed: how much to take   FLUoxetine 10 MG capsule Commonly known as: PROZAC Take 10 mg by mouth daily.   hydrOXYzine 25 MG capsule Commonly known as: VISTARIL Take 25 mg by mouth daily as needed for anxiety.   ketoconazole 2 % shampoo Commonly known as: NIZORAL Apply 1 Application topically 3 (three) times a week.   norethindrone-ethinyl estradiol 1-20 MG-MCG tablet Commonly known as: Junel 1/20 TAKE 1 TABLET BY MOUTH DAILY. TAKE CONTINUOUSLY, SKIP PLACEBO WEEK   ondansetron 8 MG disintegrating tablet Commonly known as: ZOFRAN-ODT Take 8 mg by mouth every 8 (eight) hours as needed for nausea or vomiting.   tiZANidine 4 MG tablet Commonly known as: ZANAFLEX TAKE 0.5-1 TABLETS (2-4 MG TOTAL) BY MOUTH 2 (TWO) TIMES DAILY AS NEEDED FOR MUSCLE SPASMS.        Allergies   Allergen Reactions   Pecan Extract Allergy Skin Test Anaphylaxis   Strawberry Extract Anaphylaxis   Other Hives    Red Meat  and chicken   Shellfish Allergy     lobster Other reaction(s): Other lobster   Gluten Meal     Consultations: GI  Procedures:   Discharge Exam: BP (!) 110/58 (BP Location: Right Arm)   Pulse 97   Temp 98.5 F (36.9 C) (Oral)   Resp 15   Ht 5\' 7"  (1.702 m)   Wt 75.8 kg   SpO2 100%   BMI 26.18 kg/m  Physical Exam Constitutional:      Appearance: Normal appearance.  HENT:     Head: Normocephalic and atraumatic.     Mouth/Throat:     Mouth: Mucous membranes are moist.  Eyes:     Extraocular Movements: Extraocular movements intact.  Cardiovascular:     Rate and  Rhythm: Normal rate and regular rhythm.  Pulmonary:     Effort: Pulmonary effort is normal.     Breath sounds: Normal breath sounds.  Abdominal:     General: Bowel sounds are normal. There is no distension.     Palpations: Abdomen is soft.     Tenderness: There is no abdominal tenderness.  Musculoskeletal:        General: Normal range of motion.     Cervical back: Normal range of motion and neck supple.  Skin:    General: Skin is warm.  Neurological:     General: No focal deficit present.     Mental Status: She is alert.  Psychiatric:        Mood and Affect: Mood normal.      The results of significant diagnostics from this hospitalization (including imaging, microbiology, ancillary and laboratory) are listed below for reference.   Microbiology: No results found for this or any previous visit (from the past 240 hour(s)).   Labs: BNP (last 3 results) No results for input(s): "BNP" in the last 8760 hours. Basic Metabolic Panel: Recent Labs  Lab 08/08/22 1804 08/10/22 0107  NA 135 142  K 3.9 3.9  CL 104 111  CO2 25 26  GLUCOSE 90 91  BUN 8 5*  CREATININE 0.86 0.84  CALCIUM 9.0 9.1   Liver Function Tests: Recent Labs  Lab 08/08/22 1804 08/10/22 0107  AST 18  16  ALT 20 19  ALKPHOS 30* 33*  BILITOT 0.6 0.7  PROT 6.1* 6.1*  ALBUMIN 3.7 3.6   Recent Labs  Lab 08/08/22 1804  LIPASE 32   No results for input(s): "AMMONIA" in the last 168 hours. CBC: Recent Labs  Lab 08/08/22 1804 08/09/22 0234 08/09/22 1135 08/09/22 1811 08/10/22 0107 08/11/22 0909  WBC 7.1  --   --   --  5.1 3.6*  NEUTROABS 4.0  --   --   --   --   --   HGB 10.6* 11.9* 9.6* 10.4* 10.4* 11.7*  HCT 29.6* 32.0* 26.5* 29.3* 28.7* 32.6*  MCV 92.2  --   --   --  91.4 91.1  PLT 249  --   --   --  222 268   Cardiac Enzymes: No results for input(s): "CKTOTAL", "CKMB", "CKMBINDEX", "TROPONINI" in the last 168 hours. BNP: Invalid input(s): "POCBNP" CBG: No results for input(s): "GLUCAP" in the last 168 hours. D-Dimer No results for input(s): "DDIMER" in the last 72 hours. Hgb A1c No results for input(s): "HGBA1C" in the last 72 hours. Lipid Profile No results for input(s): "CHOL", "HDL", "LDLCALC", "TRIG", "CHOLHDL", "LDLDIRECT" in the last 72 hours. Thyroid function studies No results for input(s): "TSH", "T4TOTAL", "T3FREE", "THYROIDAB" in the last 72 hours.  Invalid input(s): "FREET3" Anemia work up Recent Labs    08/09/22 0846  TIBC 307  IRON 107   Urinalysis    Component Value Date/Time   COLORURINE STRAW (A) 08/08/2022 1941   APPEARANCEUR CLEAR 08/08/2022 1941   LABSPEC 1.004 (L) 08/08/2022 1941   PHURINE 6.0 08/08/2022 1941   GLUCOSEU NEGATIVE 08/08/2022 1941   HGBUR NEGATIVE 08/08/2022 1941   BILIRUBINUR NEGATIVE 08/08/2022 1941   BILIRUBINUR negative 04/20/2020 1356   KETONESUR NEGATIVE 08/08/2022 1941   PROTEINUR NEGATIVE 08/08/2022 1941   UROBILINOGEN 0.2 04/20/2020 1356   NITRITE NEGATIVE 08/08/2022 1941   LEUKOCYTESUR NEGATIVE 08/08/2022 1941   Sepsis Labs Recent Labs  Lab 08/08/22 1804 08/10/22 0107 08/11/22 0909  WBC 7.1 5.1 3.6*  Microbiology No results found for this or any previous visit (from the past 240  hour(s)).  Procedures/Studies: CT ABDOMEN PELVIS W CONTRAST  Result Date: 08/09/2022 CLINICAL DATA:  Lower GI bleed, rectal bleeding for 2 days with abdominal pain and nausea. EXAM: CT ABDOMEN AND PELVIS WITH CONTRAST TECHNIQUE: Multidetector CT imaging of the abdomen and pelvis was performed using the standard protocol following bolus administration of intravenous contrast. RADIATION DOSE REDUCTION: This exam was performed according to the departmental dose-optimization program which includes automated exposure control, adjustment of the mA and/or kV according to patient size and/or use of iterative reconstruction technique. CONTRAST:  OMNIPAQUE IOHEXOL 300 MG/ML  SOLN COMPARISON:  None Available. FINDINGS: Lower chest: No acute abnormality. Hepatobiliary: No focal liver abnormality is seen. No gallstones, gallbladder wall thickening, or biliary dilatation. Pancreas: Unremarkable. No pancreatic ductal dilatation or surrounding inflammatory changes. Spleen: Normal in size without focal abnormality. Adrenals/Urinary Tract: Adrenal glands are unremarkable. Kidneys are normal, without renal calculi, focal lesion, or hydronephrosis. Bladder is unremarkable. Stomach/Bowel: Stomach is within normal limits. Appendix is not seen. No evidence of bowel wall thickening, distention, or inflammatory changes. No free air or pneumatosis. Vascular/Lymphatic: No significant vascular findings are present. No enlarged abdominal or pelvic lymph nodes. Reproductive: Uterus and bilateral adnexa are unremarkable. Other: No abdominal wall hernia or abnormality. No abdominopelvic ascites. 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 IMPRESSION: No acute intra-abdominal process. Electronically Signed   By: Thornell Sartorius M.D.   On: 08/09/2022 04:34     Time coordinating discharge: Over 30 minutes    Lewie Chamber, MD  [facility] 08/11/2022, 2:43 PM

## 2022-08-11 NOTE — Telephone Encounter (Signed)
Patient has been scheduled for a 6-week hospital follow up with Vicie Mutters, PA-C on Monday, 09/22/22 at 11:30 am. Appt information will be available on hospital discharge summary. Will send appt information via MyChart and will mail a copy also.  Lab orders and reminder in epic.

## 2022-08-11 NOTE — Progress Notes (Signed)
Agree with assessment and plan as outlined. Chart / notes reviewed from inpatient team - ischemic colitis suspected.

## 2022-08-12 ENCOUNTER — Other Ambulatory Visit (HOSPITAL_COMMUNITY): Payer: Self-pay

## 2022-08-19 ENCOUNTER — Encounter: Payer: 59 | Attending: Physical Medicine and Rehabilitation | Admitting: Physical Medicine & Rehabilitation

## 2022-08-19 ENCOUNTER — Encounter: Payer: Self-pay | Admitting: Physical Medicine & Rehabilitation

## 2022-08-19 VITALS — BP 108/70 | HR 88 | Temp 98.4°F | Ht 67.0 in | Wt 168.0 lb

## 2022-08-19 DIAGNOSIS — M7918 Myalgia, other site: Secondary | ICD-10-CM | POA: Insufficient documentation

## 2022-08-19 NOTE — Progress Notes (Signed)
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

## 2022-08-19 NOTE — Telephone Encounter (Signed)
Patient had Shock Wave Therapy with Dr. Letta Pate today. She wanted to talk to you about her most recent mychart message and her medication regimen. Please call her when you get a chance. She has some concerns about Serotonin Syndrome.   Call back phone [phone].

## 2022-08-19 NOTE — Patient Instructions (Signed)
No restrictions, recommend 6 weekly visits

## 2022-08-27 ENCOUNTER — Other Ambulatory Visit: Payer: Self-pay | Admitting: Obstetrics and Gynecology

## 2022-08-27 DIAGNOSIS — Z3041 Encounter for surveillance of contraceptive pills: Secondary | ICD-10-CM

## 2022-08-30 ENCOUNTER — Other Ambulatory Visit: Payer: Self-pay | Admitting: Obstetrics and Gynecology

## 2022-08-30 DIAGNOSIS — Z3041 Encounter for surveillance of contraceptive pills: Secondary | ICD-10-CM

## 2022-09-01 NOTE — Telephone Encounter (Signed)
I've sent in one more pack of OCP's. She needs to schedule an annual exam prior to more refills.

## 2022-09-01 NOTE — Telephone Encounter (Signed)
Med refill request:Junel 1/20 Last AEX:05/03/21 -JJ ; OV 04/01/22 Next AEX: Not scheduled  Last MMG (if hormonal med)N/A   Rx sent on 08/04/22 for #28/0RF with message to scheduled AEX for further refills.   Refill authorized: Please Advise?

## 2022-09-01 NOTE — Telephone Encounter (Signed)
Call to patient, left detailed message, ok per dpr. Advised per Dr. Talbert Nan. Return call to office to schedule AEX at [phone].   Encounter closed.

## 2022-09-07 ENCOUNTER — Encounter: Payer: Self-pay | Admitting: Physical Medicine and Rehabilitation

## 2022-09-08 ENCOUNTER — Telehealth: Payer: Self-pay

## 2022-09-08 ENCOUNTER — Other Ambulatory Visit (HOSPITAL_COMMUNITY): Payer: Self-pay

## 2022-09-08 NOTE — Telephone Encounter (Signed)
MyChart message sent to patient with lab reminder.  

## 2022-09-08 NOTE — Telephone Encounter (Signed)
-----   Message from Missy Sabins, RN sent at 08/11/2022 11:44 AM EDT ----- Regarding: Labs CBC, CMET due - orders are in epic

## 2022-09-08 NOTE — Telephone Encounter (Signed)
Telephone call to patient, Patient states she do not want to continue taking Botox for her migraines. She is working the [facility] clinic right now to figure things out.  She will discuss at her 10/2022 visit further medication options.    Advised patient we have a Botox here that her insurance was charged. Call [facility] to cancel order.  And she can call any time before her visit if she prefers a preventive medication.

## 2022-09-08 NOTE — Telephone Encounter (Signed)
Patient reviewed MyChart message. Last read by Rose Phi "[name]" at 12:24 PM on 09/08/2022.

## 2022-09-09 ENCOUNTER — Ambulatory Visit: Payer: 59 | Admitting: Physician Assistant

## 2022-09-12 ENCOUNTER — Other Ambulatory Visit (INDEPENDENT_AMBULATORY_CARE_PROVIDER_SITE_OTHER): Payer: 59

## 2022-09-12 ENCOUNTER — Ambulatory Visit: Payer: 59 | Admitting: Neurology

## 2022-09-12 DIAGNOSIS — R109 Unspecified abdominal pain: Secondary | ICD-10-CM | POA: Diagnosis not present

## 2022-09-12 DIAGNOSIS — K625 Hemorrhage of anus and rectum: Secondary | ICD-10-CM

## 2022-09-12 DIAGNOSIS — K921 Melena: Secondary | ICD-10-CM | POA: Diagnosis not present

## 2022-09-12 LAB — CBC WITH DIFFERENTIAL/PLATELET
Basophils Absolute: 0.1 10*3/uL (ref 0.0–0.1)
Basophils Relative: 0.7 % (ref 0.0–3.0)
Eosinophils Absolute: 0.1 10*3/uL (ref 0.0–0.7)
Eosinophils Relative: 1.5 % (ref 0.0–5.0)
HCT: 38.1 % (ref 36.0–46.0)
Hemoglobin: 13.1 g/dL (ref 12.0–15.0)
Lymphocytes Relative: 34.4 % (ref 12.0–46.0)
Lymphs Abs: 2.3 10*3/uL (ref 0.7–4.0)
MCHC: 34.4 g/dL (ref 30.0–36.0)
MCV: 95.6 fl (ref 78.0–100.0)
Monocytes Absolute: 0.4 10*3/uL (ref 0.1–1.0)
Monocytes Relative: 6.6 % (ref 3.0–12.0)
Neutro Abs: 3.8 10*3/uL (ref 1.4–7.7)
Neutrophils Relative %: 56.8 % (ref 43.0–77.0)
Platelets: 327 10*3/uL (ref 150.0–400.0)
RBC: 3.99 Mil/uL (ref 3.87–5.11)
RDW: 12.7 % (ref 11.5–14.6)
WBC: 6.7 10*3/uL (ref 4.5–10.5)

## 2022-09-12 LAB — COMPREHENSIVE METABOLIC PANEL
ALT: 23 U/L (ref 0–35)
AST: 19 U/L (ref 0–37)
Albumin: 4.5 g/dL (ref 3.5–5.2)
Alkaline Phosphatase: 34 U/L — ABNORMAL LOW (ref 39–117)
BUN: 13 mg/dL (ref 6–23)
CO2: 27 mEq/L (ref 19–32)
Calcium: 9.5 mg/dL (ref 8.4–10.5)
Chloride: 104 mEq/L (ref 96–112)
Creatinine, Ser: 0.82 mg/dL (ref 0.40–1.20)
GFR: 102.89 mL/min (ref >60.00)
Glucose, Bld: 88 mg/dL (ref 70–99)
Potassium: 4.1 mEq/L (ref 3.5–5.1)
Sodium: 138 mEq/L (ref 135–145)
Total Bilirubin: 0.4 mg/dL (ref 0.2–1.2)
Total Protein: 7 g/dL (ref 6.0–8.3)

## 2022-09-17 ENCOUNTER — Other Ambulatory Visit: Payer: Self-pay | Admitting: *Deleted

## 2022-09-17 ENCOUNTER — Other Ambulatory Visit: Payer: Self-pay | Admitting: Physical Medicine and Rehabilitation

## 2022-09-17 MED ORDER — DULOXETINE HCL 30 MG PO CPEP: 30.0000 mg | ORAL_CAPSULE | Freq: Every day | ORAL | 5 refills | Status: DC

## 2022-09-17 NOTE — Progress Notes (Signed)
I  called the pharmacy the Duloxetine 30 MG did transmit today.

## 2022-09-17 NOTE — Progress Notes (Deleted)
09/17/2022 Kiara Brewer [id number] 07/28/2001  Referring provider: Alberteen Sam, * Primary GI doctor: {acdocs:27040}  ASSESSMENT AND PLAN:   There are no diagnoses linked to this encounter.   Patient Care Team: Hite, Jackquline Berlin, FNP as PCP - General (Family Medicine) Meriam Sprague, MD as PCP - Cardiology (Cardiology) Romualdo Bolk, MD as Consulting Physician (Obstetrics and Gynecology)  HISTORY OF PRESENT ILLNESS: 21 y.o. female with a past medical history of anxiety, depression, asthma, fibromyalgia, chronic pain, Osgood slaughters disease, POTS, GERD history of anal fissure and others listed below, presents for hospital follow up.   Patient was in the hospital from 08/08/22 to 08/11/22, was in the hospital for intermittent hematochezia with lower abdominal discomfort, CT suggested possible segmental infectious or inflammatory colitis involving distal transverse to proximal sigmoid colon evaluation limited to due to poor distention.  Repeat CT 10/12 unremarkable.  01/2021 colonoscopy rectal bleeding and altered bowel habits showed anal fissure, normal TI, internal hemorrhoids biopsies right, left and transverse colon negative for microscopic colitis. 01/2021 EGD for abdominal pain and reflux showed grade a reflux esophagitis, normal esophagus dilated empirically 17 mm, normal stomach, normal duodenum, showed peptic duodenitis, reactive gastropathy negative H. pylori, negative EOE. 08/04/2022 pathogen stool unremarkable for Giardia and Cryptosporidium. 08/10/2022   HGB 10.4 MCV 91.4 Platelets 222 Iron 107  B12 390    She  reports that she has never smoked. She has never used smokeless tobacco. She reports that she does not drink alcohol and does not use drugs.  Current Medications:   Current Outpatient Medications (Endocrine & Metabolic):    norethindrone-ethinyl estradiol (JUNEL 1/20) 1-20 MG-MCG tablet, TAKE 1 TABLET BY MOUTH DAILY. TAKE  CONTINUOUSLY, SKIP PLACEBO WEEK   Current Outpatient Medications (Cardiovascular):    bisoprolol (ZEBETA) 5 MG tablet, Take 1.5 tablets (7.5 mg total) by mouth daily.   EPINEPHrine 0.3 mg/0.3 mL IJ SOAJ injection, Inject into the muscle.   Current Outpatient Medications (Respiratory):    albuterol (PROVENTIL HFA;VENTOLIN HFA) 108 (90 Base) MCG/ACT inhaler, Inhale 2 puffs into the lungs every 6 (six) hours as needed for wheezing or shortness of breath.       Current Outpatient Medications (Other):    ARIPiprazole (ABILIFY) 2 MG tablet, Take 2 mg by mouth daily.   B Complex-C (SUPER B COMPLEX PO), Take by mouth.   botulinum toxin Type A (BOTOX) 200 units injection, Inject 155 units IM into multiple site in the face,neck and head once every 90 days   buPROPion (WELLBUTRIN XL) 300 MG 24 hr tablet, Take 1 tablet by mouth daily.   calcium carbonate (TUMS - DOSED IN MG ELEMENTAL CALCIUM) 500 MG chewable tablet, Chew 1 tablet by mouth as needed for indigestion or heartburn. (Patient not taking: Reported on 08/19/2022)   Cholecalciferol 50 MCG (2000 UT) CAPS, Take by mouth.   DULoxetine (CYMBALTA) 60 MG capsule, Take 2 capsules (120 mg total) by mouth daily. (Patient taking differently: Take 60 mg by mouth daily.)   FLUoxetine (PROZAC) 10 MG capsule, Take 10 mg by mouth daily.   hydrOXYzine (VISTARIL) 25 MG capsule, Take 25 mg by mouth daily as needed for anxiety.   ketoconazole (NIZORAL) 2 % shampoo, Apply 1 Application topically 3 (three) times a week.   Multiple Vitamin (MULTIVITAMIN) tablet, Take 1 tablet by mouth daily.   ondansetron (ZOFRAN-ODT) 8 MG disintegrating tablet, Take 8 mg by mouth every 8 (eight) hours as needed for nausea or vomiting.   tiZANidine (ZANAFLEX) 4  MG tablet, TAKE 0.5-1 TABLETS (2-4 MG TOTAL) BY MOUTH 2 (TWO) TIMES DAILY AS NEEDED FOR MUSCLE SPASMS.  Current Facility-Administered Medications (Other):    0.9 %  sodium chloride infusion  Medical History:  Past  Medical History:  Diagnosis Date   Anal pain    Anxiety    Arthritis    Complex regional pain syndrome of both lower extremities    per pt w/ right thigh numbness   Depression    Ehlers-Danlos disease    Exercise-induced asthma    Fainting episodes    Fibromyalgia    GERD (gastroesophageal reflux disease)    History of concussion 04/2019   per pt no residual   History of Osgood-Schlatter disease    History of syncope    ED visit 08-29-2020 w/ cp, palpitations--- pt sent for cardiology evaluation , dr Lalla Brothers, office note 09-07-2020 dx orthostatic intolerence ;   echo in epic 09-14-2020 , normal;  event monitor 09-13-2020 no signfiance arrhythmia , SR/ST;  CT angio chest 09-02-2020  normal   Irritable bowel    Migraine without aura    Orthostatic hypotension    POTS (postural orthostatic tachycardia syndrome)    Sacral radiculopathy    Spondylolisthesis, lumbosacral region    L5--S1   Wears contact lenses    Allergies:  Allergies  Allergen Reactions   Gluten Meal     Other reaction(s): GI intolerance Vomiting   Pecan Extract Allergy Skin Test Anaphylaxis and Hives   Strawberry Extract Anaphylaxis and Hives   Beef Allergy Hives    Other reaction(s): GI intolerance RED MEAT   Other Hives    Red Meat  and chicken   Shellfish Allergy Hives    lobster Other reaction(s): Other lobster lobster  lobster Other reaction(s): Other lobster     Surgical History:  She  has a past surgical history that includes Wisdom tooth extraction (2020); Tonsillectomy and adenoidectomy (2010); Rectal exam under anesthesia (N/A, 11/22/2020); and Sphincterotomy (N/A, 11/22/2020). Family History:  Her family history includes Alcohol abuse in her paternal grandfather; Anxiety disorder in her brother and brother; Arthritis in her maternal grandmother and mother; Asthma in her brother, brother, and mother; COPD in her father and paternal grandfather; Cancer in her maternal grandmother, paternal  grandfather, and paternal grandmother; Colon polyps in her maternal grandfather; Diabetes in her maternal grandfather; Heart attack in her maternal grandfather and paternal grandfather; Heart disease in her maternal grandfather and paternal grandfather; Hyperlipidemia in her maternal grandfather and paternal grandfather; Hypertension in her father, maternal grandfather, and paternal grandfather; Migraines in her brother, father, and mother; Miscarriages / Stillbirths in her maternal grandmother and mother; OCD in her brother; Stroke in her maternal grandfather.  REVIEW OF SYSTEMS  : All other systems reviewed and negative except where noted in the History of Present Illness.  PHYSICAL EXAM: There were no vitals taken for this visit. General:   Pleasant, well developed female in no acute distress Head:   Normocephalic and atraumatic. Eyes:  sclerae anicteric,conjunctive pink  Heart:   {HEART EXAM HEM/ONC:21750} Pulm:  Clear anteriorly; no wheezing Abdomen:   {BlankSingle:19197::"Distended","Ridged","Soft"}, {BlankSingle:19197::"Flat","Obese","Non-distended"} AB, {BlankSingle:19197::"Absent","Hyperactive, tinkling","Hypoactive","Sluggish","Active"} bowel sounds. {actendernessAB:27319} tenderness {anatomy; site abdomen:5010}. {BlankMultiple:19196::"Without guarding","With guarding","Without rebound","With rebound"}, No organomegaly appreciated. Rectal: {acrectalexam:27461} Extremities:  {With/Without:304960234} edema. Msk: Symmetrical without gross deformities. Peripheral pulses intact.  Neurologic:  Alert and  oriented x4;  No focal deficits.  Skin:   Dry and intact without significant lesions or rashes. Psychiatric:  Cooperative. Normal mood and affect.  Doree Albee, PA-C 8:27 AM

## 2022-09-21 ENCOUNTER — Other Ambulatory Visit: Payer: Self-pay | Admitting: Obstetrics and Gynecology

## 2022-09-21 DIAGNOSIS — Z3041 Encounter for surveillance of contraceptive pills: Secondary | ICD-10-CM

## 2022-09-22 ENCOUNTER — Ambulatory Visit: Payer: 59 | Admitting: Physician Assistant

## 2022-09-24 ENCOUNTER — Encounter: Payer: 59 | Admitting: Physical Medicine and Rehabilitation

## 2022-10-07 ENCOUNTER — Encounter: Payer: 59 | Attending: Physical Medicine and Rehabilitation | Admitting: Physical Medicine & Rehabilitation

## 2022-10-07 ENCOUNTER — Encounter: Payer: Self-pay | Admitting: Physical Medicine & Rehabilitation

## 2022-10-07 VITALS — BP 109/72 | HR 63 | Ht 67.0 in | Wt 179.0 lb

## 2022-10-07 DIAGNOSIS — M7918 Myalgia, other site: Secondary | ICD-10-CM | POA: Insufficient documentation

## 2022-10-07 NOTE — Progress Notes (Signed)
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 

## 2022-10-14 ENCOUNTER — Other Ambulatory Visit: Payer: Self-pay | Admitting: Obstetrics and Gynecology

## 2022-10-14 ENCOUNTER — Encounter: Payer: 59 | Admitting: Physical Medicine & Rehabilitation

## 2022-10-14 DIAGNOSIS — Z3041 Encounter for surveillance of contraceptive pills: Secondary | ICD-10-CM

## 2022-10-14 MED ORDER — NORETHINDRONE ACET-ETHINYL EST 1-20 MG-MCG PO TABS: ORAL_TABLET | ORAL | 0 refills | Status: DC

## 2022-10-14 NOTE — Telephone Encounter (Signed)
Patient annual exam scheduled on 11/21/22

## 2022-10-14 NOTE — Telephone Encounter (Signed)
Last AEX 05/03/2021--nothing scheduled. Will send msg to pt to schedule annual exam.

## 2022-10-21 ENCOUNTER — Encounter: Payer: Self-pay | Admitting: Internal Medicine

## 2022-10-21 ENCOUNTER — Encounter: Payer: 59 | Admitting: Physical Medicine & Rehabilitation

## 2022-10-21 ENCOUNTER — Encounter: Payer: Self-pay | Admitting: Physical Medicine & Rehabilitation

## 2022-10-21 ENCOUNTER — Other Ambulatory Visit: Payer: Self-pay | Admitting: Internal Medicine

## 2022-10-21 VITALS — BP 122/76 | HR 91 | Ht 67.0 in

## 2022-10-21 DIAGNOSIS — M7918 Myalgia, other site: Secondary | ICD-10-CM

## 2022-10-21 NOTE — Patient Instructions (Signed)
Dr Dimple Casey is a newer rheumatologist at the same practice at [facility].  I will be doing the trial of Shockwave therapy after which you will follow up with Dr Berline Chough your primary physiatrist.     Hip Bursitis Rehab Ask your health care provider which exercises are safe for you. Do exercises exactly as told by your health care provider and adjust them as directed. It is normal to feel mild stretching, pulling, tightness, or discomfort as you do these exercises. Stop right away if you feel sudden pain or your pain gets worse. Do not begin these exercises until told by your health care provider. Stretching exercise This exercise warms up your muscles and joints and improves the movement and flexibility of your hip. This exercise also helps to relieve pain and stiffness. Iliotibial band stretch An iliotibial band is a strong band of muscle tissue that runs from the outer side of your hip to the outer side of your thigh and knee. Lie on your side with your left / right leg in the top position. Bend your left / right knee and grab your ankle. Stretch out your bottom arm to help you balance. Slowly bring your knee back so your thigh is slightly behind your body. Slowly lower your knee toward the floor until you feel a gentle stretch on the outside of your left / right thigh. If you do not feel a stretch and your knee will not lower more toward the floor, place the heel of your other foot on top of your knee and pull your knee down toward the floor with your foot. Hold this position for __________ seconds. Slowly return to the starting position. Repeat __________ times. Complete this exercise __________ times a day. Strengthening exercises These exercises build strength and endurance in your hip and pelvis. Endurance is the ability to use your muscles for a long time, even after they get tired. Bridge This exercise strengthens the muscles that move your thigh backward (hip extensors). Lie on your back  on a firm surface with your knees bent and your feet flat on the floor. Tighten your buttocks muscles and lift your buttocks off the floor until your trunk is level with your thighs. Do not arch your back. You should feel the muscles working in your buttocks and the back of your thighs. If you do not feel these muscles, slide your feet 1-2 inches (2.5-5 cm) farther away from your buttocks. If this exercise is too easy, try doing it with your arms crossed over your chest. Hold this position for __________ seconds. Slowly lower your hips to the starting position. Let your muscles relax completely after each repetition. Repeat __________ times. Complete this exercise __________ times a day. Squats This exercise strengthens the muscles in front of your thigh and knee (quadriceps). Stand in front of a table, with your feet and knees pointing straight ahead. You may rest your hands on the table for balance but not for support. Slowly bend your knees and lower your hips like you are going to sit in a chair. Keep your weight over your heels, not over your toes. Keep your lower legs upright so they are parallel with the table legs. Do not let your hips go lower than your knees. Do not bend lower than told by your health care provider. If your hip pain increases, do not bend as low. Hold the squat position for __________ seconds. Slowly push with your legs to return to standing. Do not use your  hands to pull yourself to standing. Repeat __________ times. Complete this exercise __________ times a day. Hip hike  Stand sideways on a bottom step. Stand on your left / right leg with your other foot unsupported next to the step. You can hold on to the railing or wall for balance if needed. Keep your knees straight and your torso square. Then lift your left / right hip up toward the ceiling. Hold this position for __________ seconds. Slowly let your left / right hip lower toward the floor, past the starting  position. Your foot should get closer to the floor. Do not lean or bend your knees. Repeat __________ times. Complete this exercise __________ times a day. Single leg stand This exercise increases your balance. Without shoes, stand near a railing or in a doorway. You may hold on to the railing or door frame as needed for balance. Squeeze your left / right buttock muscles, then lift up your other foot. Do not let your left / right hip push out to the side. It is helpful to stand in front of a mirror for this exercise so you can watch your hip. Hold this position for __________ seconds. Repeat __________ times. Complete this exercise __________ times a day. This information is not intended to replace advice given to you by your health care provider. Make sure you discuss any questions you have with your health care provider. Document Revised: 09/25/2021 Document Reviewed: 09/25/2021 Elsevier Patient Education  2023 [employer].

## 2022-10-21 NOTE — Progress Notes (Signed)
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

## 2022-10-27 NOTE — Telephone Encounter (Signed)
Lets refill the bisoprolol at 10 mg daily If she wants to try propranolol I have no problem with that and would use LA 120mg  The biggest issue is will she tolerate one better than the other Thanks SK

## 2022-10-28 ENCOUNTER — Encounter: Payer: 59 | Attending: Physical Medicine and Rehabilitation | Admitting: Physical Medicine & Rehabilitation

## 2022-10-28 ENCOUNTER — Encounter: Payer: Self-pay | Admitting: Physical Medicine & Rehabilitation

## 2022-10-28 VITALS — BP 108/74 | HR 88 | Ht 67.0 in

## 2022-10-28 DIAGNOSIS — M7918 Myalgia, other site: Secondary | ICD-10-CM | POA: Insufficient documentation

## 2022-10-28 NOTE — Progress Notes (Signed)
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

## 2022-10-30 ENCOUNTER — Encounter: Payer: Self-pay | Admitting: Physical Medicine & Rehabilitation

## 2022-10-30 MED ORDER — BISOPROLOL FUMARATE 10 MG PO TABS: 10.0000 mg | ORAL_TABLET | Freq: Every day | ORAL | 3 refills | Status: DC

## 2022-11-01 ENCOUNTER — Other Ambulatory Visit: Payer: Self-pay | Admitting: Physical Medicine and Rehabilitation

## 2022-11-04 ENCOUNTER — Encounter: Payer: 59 | Admitting: Physical Medicine & Rehabilitation

## 2022-11-05 ENCOUNTER — Encounter: Payer: Self-pay | Admitting: Physical Medicine & Rehabilitation

## 2022-11-06 ENCOUNTER — Encounter: Payer: 59 | Admitting: Physical Medicine & Rehabilitation

## 2022-11-07 ENCOUNTER — Encounter: Payer: Self-pay | Admitting: Internal Medicine

## 2022-11-10 ENCOUNTER — Encounter: Payer: Self-pay | Admitting: Neurology

## 2022-11-11 ENCOUNTER — Encounter: Payer: 59 | Admitting: Physical Medicine & Rehabilitation

## 2022-11-18 ENCOUNTER — Ambulatory Visit: Payer: 59 | Admitting: Neurology

## 2022-11-18 NOTE — Progress Notes (Signed)
22 y.o. G0P0000 Married White or Caucasian Not Hispanic or Latino female here for annual exam.  She is on continuous OCP's, no cycles. No pain with sex.   H/O POTS. She got a service dog last year, marked improvement in her quality of life.     No LMP recorded. (Menstrual status: Oral contraceptives).          Sexually active: Yes.    The current method of family planning is OCP (estrogen/progesterone).    Exercising: Yes.     Cardio and weights  Smoker:  no  Health Maintenance: Pap:  n/a History of abnormal Pap:  no MMG:  n/a BMD:   n/a Colonoscopy: n/a TDaP:  01/02/14 Gardasil: x2, last in 2017. Declines the 3rd shot.    reports that she has never smoked. She has never used smokeless tobacco. She reports that she does not drink alcohol and does not use drugs. 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   Past Medical History:  Diagnosis Date   Anal pain    Anxiety    Arthritis    Complex regional pain syndrome of both lower extremities    per pt w/ right thigh numbness   Depression    Ehlers-Danlos disease    Exercise-induced asthma    Fainting episodes    Fibromyalgia    GERD (gastroesophageal reflux disease)    History of concussion 04/2019   per pt no residual   History of Osgood-Schlatter disease    History of syncope    ED visit 08-29-2020 w/ cp, palpitations--- pt sent for cardiology evaluation , dr Quentin Ore, office note 09-07-2020 dx orthostatic intolerence ;   echo in epic 09-14-2020 , normal;  event monitor 09-13-2020 no signfiance arrhythmia , SR/ST;  CT angio chest 09-02-2020  normal   Irritable bowel    Migraine without aura    Orthostatic hypotension    POTS (postural orthostatic tachycardia syndrome)    Sacral radiculopathy    Spondylolisthesis, lumbosacral region    L5--S1   Wears contact lenses     Past Surgical History:  Procedure Laterality Date   RECTAL EXAM UNDER ANESTHESIA N/A 11/22/2020   Procedure: ANAL  EXAM UNDER  ANESTHESIA;  Surgeon: Leighton Ruff, MD;  Location: [facility];  Service: General;  Laterality: N/A;   SPHINCTEROTOMY N/A 11/22/2020   Procedure: CHEMICAL SPHINCTEROTOMY/ FISSURECTOMY;  Surgeon: Leighton Ruff, MD;  Location: [facility];  Service: General;  Laterality: N/A;   TONSILLECTOMY AND ADENOIDECTOMY  2010   WISDOM TOOTH EXTRACTION  2020    Current Outpatient Medications  Medication Sig Dispense Refill   albuterol (PROVENTIL HFA;VENTOLIN HFA) 108 (90 Base) MCG/ACT inhaler Inhale 2 puffs into the lungs every 6 (six) hours as needed for wheezing or shortness of breath. 1 Inhaler 3   ARIPiprazole (ABILIFY) 2 MG tablet Take 2 mg by mouth daily.     ARIPiprazole (ABILIFY) 2 MG tablet Take 1 tablet by mouth daily.     B Complex-C (SUPER B COMPLEX PO) Take by mouth.     bisoprolol (ZEBETA) 10 MG tablet Take 1 tablet (10 mg total) by mouth daily. 90 tablet 3   buPROPion (WELLBUTRIN XL) 300 MG 24 hr tablet Take 1 tablet by mouth daily.     calcium carbonate (TUMS - DOSED IN MG ELEMENTAL CALCIUM) 500 MG chewable tablet Chew 1 tablet by mouth as needed for indigestion or heartburn.     Cholecalciferol 50 MCG (2000  UT) CAPS Take by mouth.     DULoxetine (CYMBALTA) 60 MG capsule TAKE 2 CAPSULES BY MOUTH DAILY 180 capsule 1   EPINEPHrine 0.3 mg/0.3 mL IJ SOAJ injection Inject into the muscle.     ipratropium (ATROVENT) 0.03 % nasal spray SMARTSIG:1 Spray(s) Both Nares 1-3 Times Daily PRN     ketoconazole (NIZORAL) 2 % shampoo Apply 1 Application topically 3 (three) times a week.     Multiple Vitamin (MULTIVITAMIN) tablet Take 1 tablet by mouth daily.     norethindrone-ethinyl estradiol (JUNEL 1/20) 1-20 MG-MCG tablet TAKE 1 TABLET BY MOUTH DAILY. TAKE CONTINUOUSLY, SKIP PLACEBO WEEK 84 tablet 0   Omega-3 Fatty Acids (FISH OIL) 1200 MG CAPS Take by mouth.     ondansetron (ZOFRAN-ODT) 8 MG disintegrating tablet Take 8 mg by mouth every 8 (eight) hours as needed for  nausea or vomiting.     tiZANidine (ZANAFLEX) 4 MG tablet TAKE 0.5-1 TABLETS (2-4 MG TOTAL) BY MOUTH 2 (TWO) TIMES DAILY AS NEEDED FOR MUSCLE SPASMS. 180 tablet 1   DULoxetine (CYMBALTA) 30 MG capsule Take 1 capsule (30 mg total) by mouth daily. 30 capsule 5   Current Facility-Administered Medications  Medication Dose Route Frequency Provider Last Rate Last Admin   0.9 %  sodium chloride infusion  500 mL Intravenous Once Armbruster, Carlota Raspberry, MD        Family History  Problem Relation Age of Onset   Arthritis Mother    Asthma Mother    Miscarriages / [place] Mother    Migraines Mother    COPD Father        per pt's mother-mild   Hypertension Father    Migraines Father    Asthma Brother    Migraines Brother    Anxiety disorder Brother    Arthritis Maternal Grandmother    Cancer Maternal Grandmother    Miscarriages / Stillbirths Maternal Grandmother    Diabetes Maternal Grandfather    Heart attack Maternal Grandfather    Heart disease Maternal Grandfather    Hyperlipidemia Maternal Grandfather    Hypertension Maternal Grandfather    Stroke Maternal Grandfather    Colon polyps Maternal Grandfather    Cancer Paternal Grandmother    Alcohol abuse Paternal Grandfather    Cancer Paternal Grandfather    COPD Paternal Grandfather    Heart attack Paternal Grandfather    Heart disease Paternal Grandfather    Hyperlipidemia Paternal Grandfather    Hypertension Paternal Grandfather    Asthma Brother    OCD Brother    Anxiety disorder Brother    Seizures Neg Hx    Depression Neg Hx    Bipolar disorder Neg Hx    Schizophrenia Neg Hx    ADD / ADHD Neg Hx    Autism Neg Hx    Colon cancer Neg Hx    Esophageal cancer Neg Hx    Rectal cancer Neg Hx    Stomach cancer Neg Hx     Review of Systems  All other systems reviewed and are negative.   Exam:   BP 110/80   Pulse 92   Ht 5\' 7"  (1.702 m)   Wt 168 lb (76.2 kg)   SpO2 97%   BMI 26.31 kg/m   Weight change:  @WEIGHTCHANGE @ Height:   Height: 5\' 7"  (170.2 cm)  Ht Readings from Last 3 Encounters:  11/21/22 5\' 7"  (1.702 m)  10/28/22 5\' 7"  (1.702 m)  10/21/22 5\' 7"  (1.702 m)    General appearance: alert, cooperative  and appears stated age Head: Normocephalic, without obvious abnormality, atraumatic Neck: no adenopathy, supple, symmetrical, trachea midline and thyroid normal to inspection and palpation Lungs: clear to auscultation bilaterally Cardiovascular: regular rate and rhythm Breasts: normal appearance, no masses or tenderness Abdomen: soft, non-tender; non distended,  no masses,  no organomegaly Extremities: extremities normal, atraumatic, no cyanosis or edema Skin: Skin color, texture, turgor normal. No rashes or lesions Lymph nodes: Cervical, supraclavicular, and axillary nodes normal. No abnormal inguinal nodes palpated Neurologic: Grossly normal Pelvic: deferred  1. Well woman exam Pap due next year She declines STD testing  Declines the 3rd gardasil shot Labs with primary  2. Encounter for surveillance of contraceptive pills Doing well on continuous OCP's - norethindrone-ethinyl estradiol (JUNEL 1/20) 1-20 MG-MCG tablet; TAKE 1 TABLET BY MOUTH DAILY. TAKE CONTINUOUSLY, SKIP PLACEBO WEEK  Dispense: 112 tablet; Refill: 3

## 2022-11-21 ENCOUNTER — Encounter: Payer: Self-pay | Admitting: Obstetrics and Gynecology

## 2022-11-21 ENCOUNTER — Encounter: Payer: 59 | Admitting: Physical Medicine & Rehabilitation

## 2022-11-21 ENCOUNTER — Ambulatory Visit (INDEPENDENT_AMBULATORY_CARE_PROVIDER_SITE_OTHER): Payer: 59 | Admitting: Obstetrics and Gynecology

## 2022-11-21 VITALS — BP 110/80 | HR 92 | Ht 67.0 in | Wt 168.0 lb

## 2022-11-21 DIAGNOSIS — Z3041 Encounter for surveillance of contraceptive pills: Secondary | ICD-10-CM | POA: Diagnosis not present

## 2022-11-21 DIAGNOSIS — Z01419 Encounter for gynecological examination (general) (routine) without abnormal findings: Secondary | ICD-10-CM | POA: Diagnosis not present

## 2022-11-21 MED ORDER — NORETHINDRONE ACET-ETHINYL EST 1-20 MG-MCG PO TABS: ORAL_TABLET | ORAL | 3 refills | Status: AC

## 2022-11-21 NOTE — Patient Instructions (Signed)

## 2022-11-25 ENCOUNTER — Other Ambulatory Visit (HOSPITAL_COMMUNITY): Payer: Self-pay

## 2022-11-26 ENCOUNTER — Other Ambulatory Visit (HOSPITAL_COMMUNITY): Payer: Self-pay

## 2022-12-09 ENCOUNTER — Emergency Department (HOSPITAL_BASED_OUTPATIENT_CLINIC_OR_DEPARTMENT_OTHER)
Admission: EM | Admit: 2022-12-09 | Discharge: 2022-12-09 | Disposition: A | Discharge status: Home / Self Care | Payer: 59 | Attending: Emergency Medicine | Admitting: Emergency Medicine

## 2022-12-09 ENCOUNTER — Encounter (HOSPITAL_BASED_OUTPATIENT_CLINIC_OR_DEPARTMENT_OTHER): Payer: Self-pay

## 2022-12-09 ENCOUNTER — Telehealth: Payer: Self-pay | Admitting: Internal Medicine

## 2022-12-09 ENCOUNTER — Other Ambulatory Visit: Payer: Self-pay

## 2022-12-09 ENCOUNTER — Encounter: Payer: Self-pay | Admitting: Internal Medicine

## 2022-12-09 ENCOUNTER — Emergency Department (HOSPITAL_BASED_OUTPATIENT_CLINIC_OR_DEPARTMENT_OTHER): Payer: 59

## 2022-12-09 DIAGNOSIS — R0602 Shortness of breath: Secondary | ICD-10-CM | POA: Insufficient documentation

## 2022-12-09 DIAGNOSIS — R079 Chest pain, unspecified: Secondary | ICD-10-CM

## 2022-12-09 LAB — BASIC METABOLIC PANEL
Anion gap: 8 (ref 5–15)
BUN: 15 mg/dL (ref 6–20)
CO2: 24 mmol/L (ref 22–32)
Calcium: 10.3 mg/dL (ref 8.9–10.3)
Chloride: 104 mmol/L (ref 98–111)
Creatinine, Ser: 1.1 mg/dL — ABNORMAL HIGH (ref 0.44–1.00)
GFR, Estimated: >60 mL/min (ref >60)
Glucose, Bld: 101 mg/dL — ABNORMAL HIGH (ref 70–99)
Potassium: 4.5 mmol/L (ref 3.5–5.1)
Sodium: 136 mmol/L (ref 135–145)

## 2022-12-09 LAB — HEPATIC FUNCTION PANEL
ALT: 23 U/L (ref 0–44)
AST: 19 U/L (ref 15–41)
Albumin: 4.8 g/dL (ref 3.5–5.0)
Alkaline Phosphatase: 34 U/L — ABNORMAL LOW (ref 38–126)
Bilirubin, Direct: 0.2 mg/dL (ref 0.0–0.2)
Indirect Bilirubin: 0.6 mg/dL (ref 0.3–0.9)
Total Bilirubin: 0.8 mg/dL (ref 0.3–1.2)
Total Protein: 7.5 g/dL (ref 6.5–8.1)

## 2022-12-09 LAB — CBC
HCT: 38.6 % (ref 36.0–46.0)
Hemoglobin: 14 g/dL (ref 12.0–15.0)
MCH: 32.3 pg (ref 26.0–34.0)
MCHC: 36.3 g/dL — ABNORMAL HIGH (ref 30.0–36.0)
MCV: 89.1 fL (ref 80.0–100.0)
Platelets: 304 10*3/uL (ref 150–400)
RBC: 4.33 MIL/uL (ref 3.87–5.11)
RDW: 11.9 % (ref 11.5–15.5)
WBC: 5.5 10*3/uL (ref 4.0–10.5)
nRBC: 0 % (ref 0.0–0.2)

## 2022-12-09 LAB — PREGNANCY, URINE: Preg Test, Ur: NEGATIVE

## 2022-12-09 LAB — TROPONIN I (HIGH SENSITIVITY)
Troponin I (High Sensitivity): <2 ng/L (ref <18)
Troponin I (High Sensitivity): <2 ng/L (ref <18)

## 2022-12-09 LAB — D-DIMER, QUANTITATIVE: D-Dimer, Quant: <0.27 ug/mL-FEU (ref 0.00–0.50)

## 2022-12-09 MED ORDER — SODIUM CHLORIDE 0.9 % IV BOLUS
1000.0000 mL | Freq: Once | INTRAVENOUS | Status: AC
  Administered 2022-12-09: 1000 mL via INTRAVENOUS

## 2022-12-09 NOTE — Discharge Instructions (Addendum)
Your laboratory results were within normal limits today.  Please schedule an appointment with your primary care physician for further management.  If you experience any worsening symptoms please return to the emergency department.

## 2022-12-09 NOTE — ED Notes (Signed)
Reviewed AVS/discharge instruction with patient. Time allotted for and all questions answered. Patient is agreeable for d/c and escorted to ed exit by staff.  

## 2022-12-09 NOTE — ED Provider Notes (Signed)
[place] EMERGENCY DEPARTMENT AT [facility] Provider Note   CSN: [id number] Arrival date & time: 12/09/22  1658     History POTS, IBS, fibromyalgia,  Chief Complaint  Patient presents with   Chest Pain   Shortness of Breath    Kiara Brewer is a 22 y.o. female.  22 y.o female with a PMH of POTS,irritable bowel, fibromyalgia presents to the ED with a chief complaint of chest pain and shortness of breath.  Patient reports she was at her PCPs office who reportedly did an EKG, they noted some abnormalities therefore they sent her to the ER.  She did contact her cardiologist Dr. Caryl Comes who recommended she be evaluated in the emergency department.  Patient has always had a high heart rate, however over the last 3 days she has began to feel short of breath, exacerbated with any exertion, reports that her breathing was actually worse today.  She does endorse a sharp dull pain to the substernal area intermittently occurring and radiating to the left side of her chest.  She rates his pain a 4 out of 10.  This has happened in the past, however she feels that the shortness of breath is now somewhat different as she feels "off balance ", reports "fainting more easily ".  She has not had any medication changes.  She has tried using her inhaler which has not helped with her symptoms.  She does report being sick yesterday, had a cauliflower pizza, and then had 1 episode of nonbloody emesis.  In addition, she is endorsing pressure behind her eyes, describing it not a headache but intermittently feels like "my eyes are being popped out ".  She also reports when this happens she cannot hear out of 1 ear.  These are new symptoms over the last week.  She also feels kind of dizzy, having soapy taste in her mouth, also the water tasting metallic he and feeling like her mouth is very dry.  She is on OCPs. No other complaints.    The history is provided by the patient.  Chest Pain Pain location:   Substernal area Pain quality: dull and stabbing   Pain radiates to:  Does not radiate Pain severity:  Moderate Onset quality:  Sudden Duration:  3 days Timing:  Intermittent Progression:  Worsening Chronicity:  New Context: breathing   Relieved by:  Nothing Worsened by:  Exertion Ineffective treatments:  Rest Associated symptoms: dizziness, nausea and shortness of breath   Associated symptoms: no abdominal pain, no anxiety, no back pain, no fever, no lower extremity edema and no vomiting   Shortness of Breath Associated symptoms: chest pain   Associated symptoms: no abdominal pain, no fever, no sore throat and no vomiting        Home Medications Prior to Admission medications   Medication Sig Start Date End Date Taking? Authorizing Provider  albuterol (PROVENTIL HFA;VENTOLIN HFA) 108 (90 Base) MCG/ACT inhaler Inhale 2 puffs into the lungs every 6 (six) hours as needed for wheezing or shortness of breath. 12/15/18   Orma Flaming, MD  ARIPiprazole (ABILIFY) 2 MG tablet Take 2 mg by mouth daily. 08/05/22   [provider]  ARIPiprazole (ABILIFY) 2 MG tablet Take 1 tablet by mouth daily. 10/14/22 01/12/23  [provider]  B Complex-C (SUPER B COMPLEX PO) Take by mouth.    [provider]  bisoprolol (ZEBETA) 10 MG tablet Take 1 tablet (10 mg total) by mouth daily. 10/30/22   Deboraha Sprang, MD  buPROPion (WELLBUTRIN XL) 300 MG 24 hr tablet Take 1 tablet by mouth daily. 04/21/22   [provider]  calcium carbonate (TUMS - DOSED IN MG ELEMENTAL CALCIUM) 500 MG chewable tablet Chew 1 tablet by mouth as needed for indigestion or heartburn.    [provider]  Cholecalciferol 50 MCG (2000 UT) CAPS Take by mouth.    [provider]  DULoxetine (CYMBALTA) 30 MG capsule Take 1 capsule (30 mg total) by mouth daily. 09/17/22   Lovorn, Jinny Blossom, MD  DULoxetine (CYMBALTA) 60 MG capsule TAKE 2 CAPSULES BY MOUTH DAILY 11/05/22   Lovorn, Jinny Blossom, MD   EPINEPHrine 0.3 mg/0.3 mL IJ SOAJ injection Inject into the muscle. 05/19/22   [provider]  ipratropium (ATROVENT) 0.03 % nasal spray SMARTSIG:1 Spray(s) Both Nares 1-3 Times Daily PRN 10/15/22   [provider]  ketoconazole (NIZORAL) 2 % shampoo Apply 1 Application topically 3 (three) times a week. 07/14/22   [provider]  Multiple Vitamin (MULTIVITAMIN) tablet Take 1 tablet by mouth daily.    [provider]  norethindrone-ethinyl estradiol (JUNEL 1/20) 1-20 MG-MCG tablet TAKE 1 TABLET BY MOUTH DAILY. TAKE CONTINUOUSLY, SKIP PLACEBO WEEK 11/21/22   Salvadore Dom, MD  Omega-3 Fatty Acids (FISH OIL) 1200 MG CAPS Take by mouth.    [provider]  ondansetron (ZOFRAN-ODT) 8 MG disintegrating tablet Take 8 mg by mouth every 8 (eight) hours as needed for nausea or vomiting. 03/19/22   [provider]  tiZANidine (ZANAFLEX) 4 MG tablet TAKE 0.5-1 TABLETS (2-4 MG TOTAL) BY MOUTH 2 (TWO) TIMES DAILY AS NEEDED FOR MUSCLE SPASMS. 06/10/21   Lovorn, Jinny Blossom, MD      Allergies    Gluten meal, Pecan extract allergy skin test, Strawberry extract, Beef allergy, Other, and Shellfish allergy    Review of Systems   Review of Systems  Constitutional:  Negative for chills and fever.  HENT:  Negative for sore throat.   Respiratory:  Positive for shortness of breath.   Cardiovascular:  Positive for chest pain.  Gastrointestinal:  Positive for nausea. Negative for abdominal pain and vomiting.  Genitourinary:  Negative for flank pain.  Musculoskeletal:  Negative for back pain.  Neurological:  Positive for dizziness.  All other systems reviewed and are negative.   Physical Exam Updated Vital Signs BP 106/70   Pulse 81   Temp 97.9 F (36.6 C)   Resp 14   SpO2 100%  Physical Exam Vitals and nursing note reviewed.  Constitutional:      Appearance: She is well-developed.  HENT:     Head: Normocephalic and atraumatic.  Cardiovascular:      Rate and Rhythm: Normal rate.     Heart sounds: Normal heart sounds. No murmur heard. Pulmonary:     Effort: Pulmonary effort is normal.     Breath sounds: Normal breath sounds.  Neurological:     Mental Status: She is alert.     ED Results / Procedures / Treatments   Labs (all labs ordered are listed, but only abnormal results are displayed) Labs Reviewed  BASIC METABOLIC PANEL - Abnormal; Notable for the following components:      Result Value   Glucose, Bld 101 (*)    Creatinine, Ser 1.10 (*)    All other components within normal limits  CBC - Abnormal; Notable for the following components:   MCHC 36.3 (*)    All other components within normal limits  HEPATIC FUNCTION PANEL - Abnormal; Notable for  the following components:   Alkaline Phosphatase 34 (*)    All other components within normal limits  PREGNANCY, URINE  D-DIMER, QUANTITATIVE  TROPONIN I (HIGH SENSITIVITY)  TROPONIN I (HIGH SENSITIVITY)    EKG EKG Interpretation  Date/Time:  Tuesday December 09 2022 17:05:28 EST Ventricular Rate:  90 PR Interval:  184 QRS Duration: 96 QT Interval:  338 QTC Calculation: 413 R Axis:   79 Text Interpretation: Normal sinus rhythm Nonspecific T wave abnormality Abnormal ECG When compared with ECG of 31-Aug-2020 19:49, Nonspecific T wave abnormality has replaced inverted T waves in Inferior leads Confirmed by Dorie Rank [phone]) on 12/09/2022 5:13:25 PM  Radiology DG Chest Portable 1 View  Result Date: 12/09/2022 CLINICAL DATA:  Chest pain EXAM: PORTABLE CHEST 1 VIEW COMPARISON:  08/29/2020 FINDINGS: The heart size and mediastinal contours are within normal limits. Both lungs are clear. The visualized skeletal structures are unremarkable. IMPRESSION: No acute cardiopulmonary disease Electronically Signed   By: Jill Side M.D.   On: 12/09/2022 17:31    Procedures Procedures    Medications Ordered in ED Medications  sodium chloride 0.9 % bolus 1,000 mL (0 mLs Intravenous  Stopped 12/09/22 1910)    ED Course/ Medical Decision Making/ A&P                             Medical Decision Making Amount and/or Complexity of Data Reviewed Labs: ordered. Radiology: ordered.   This patient presents to the ED for concern of chest pain, this involves a number of treatment options, and is a complaint that carries with it a high risk of complications and morbidity.  The differential diagnosis includes ACS, PE, verus atypical chest pain.    Co morbidities: Discussed in HPI   Brief History:  Patient here with new onset of chest pain along with shortness of breath, does have underlying history of POTS, followed by Dr. Caryl Comes of cardiology who advised her to be evaluated at the ED.  She does report her chest pain feels somewhat different, the shortness of breath was concerning her as she was getting more exerted with activity.  No recent medication changes, although she did discuss starting a medication to help for restless leg syndrome.  She did have 1 episode of vomiting yesterday after having some cauliflower pizza.  EMR reviewed including pt PMHx, past surgical history and past visits to ER.   See HPI for more details   Lab Tests:  I ordered and independently interpreted labs.  The pertinent results include:    I personally reviewed all laboratory work and imaging. Metabolic panel without any acute abnormality specifically kidney function within normal limits and no significant electrolyte abnormalities. CBC without leukocytosis or significant anemia.   Imaging Studies:  NAD. I personally reviewed all imaging studies and no acute abnormality found. I agree with radiology interpretation.   Cardiac Monitoring:  The patient was maintained on a cardiac monitor.  I personally viewed and interpreted the cardiac monitored which showed an underlying rhythm of: NSR 90 EKG non-ischemic   Medicines ordered:  I ordered medication including bolus  for  hydration Reevaluation of the patient after these medicines showed that the patient improved I have reviewed the patients home medicines and have made adjustments as needed  Reevaluation:  After the interventions noted above I re-evaluated patient and found that they have :stayed the same   Social Determinants of Health:  The patient's social determinants of health were  a factor in the care of this patient  Problem List / ED Course:  Patient here with underlying history of POTS, fibromyalgia, followed by PCP along with cardiologist who reports she had an EKG done today at PCPs office and noted some abnormality.  Wave nonspecific changes on arrival.  Arrived to the ED with normal heart rate, normotensive, afebrile and not hypoxic.  Reports that shortness of breath has felt worse over the past 3 days.  She is on OCPs, therefore concern for pulmonary embolism.  Dimer level was obtained which was negative.  White blood cell count is normal.  CMP does show a slight elevation of her creatinine, she does report 1 episode of vomiting last night after cauliflower pizza, perhaps likely causing some changes in her autonomic disorder due to dehydration, given 1 L bolus.  X-ray without any signs of pneumonia, she is afebrile without any respiratory symptoms to be concern for viral illness at this time.  Prior history of ACS, troponins x 2 have remained benign.  LFTs are within normal limits, I do not suspect this is reflux etiology.  Patient does not report much improvement after fluids however she is okay with going home, I did discuss with her close follow-up with cardiology, will need to schedule an appointment with PCP and if she worsens she will need to return to the emergency department.  Patient with stable vital signs, discharge from the ED in stable condition. HEART SCORE 1   Dispostion:  After consideration of the diagnostic results and the patients response to treatment, I feel that the patent  would benefit from continue follow up with cardiology and PCP.     Portions of this note were generated with Lobbyist. Dictation errors may occur despite best attempts at proofreading.   Final Clinical Impression(s) / ED Diagnoses Final diagnoses:  Chest pain, unspecified type    Rx / DC Orders ED Discharge Orders     None         Janeece Fitting, Hershal Coria 12/09/22 Charlesetta Garibaldi, MD 12/12/22 1041

## 2022-12-09 NOTE — ED Triage Notes (Signed)
Pt presents POV from home with mom, ambulatory  Pt reports CP and SOB x3 days, worsening SOB. Pt seen at PCP today, they reported an abnormal EKG. Dr. Olin Pia RN advised pt to come to [facility] ED d/t CP and hx of POTS

## 2022-12-09 NOTE — Telephone Encounter (Signed)
Pt c/o of Chest Pain: STAT if CP now or developed within 24 hours  1. Are you having CP right now?   Yes  2. Are you experiencing any other symptoms (ex. SOB, nausea, vomiting, sweating)?   Sweating and SOB  3. How long have you been experiencing CP?   3 days  4. Is your CP continuous or coming and going?   Coming and going    5. Have you taken Nitroglycerin?   Not prescribed  Patient stated she also has Potts and would like to get advice on next steps. ?

## 2022-12-09 NOTE — Telephone Encounter (Signed)
Patient called following appointment with PCP. Patient states she was instructed to go the the ED for evaluation due to having CP for the past 3 days. She states she has been SOB and sweating. She states her HR is less than 100 and BP is 110/80. She wanted to know this is also recommended by our office.  Advised patient that if she is having active chest pain with SOB she needs to go to the ED for urgent evaluation. Patient verbalized understanding and had no questions.

## 2022-12-09 NOTE — ED Notes (Signed)
Pt skin is pale, mottled, lips and eyes blue and ashen in color, BLE edema and mottling noted in triage.

## 2022-12-12 ENCOUNTER — Encounter: Payer: Self-pay | Admitting: Internal Medicine

## 2022-12-14 ENCOUNTER — Telehealth: Payer: Self-pay | Admitting: Internal Medicine

## 2022-12-14 NOTE — Telephone Encounter (Signed)
  Kiara Brewer is a 22 year old female with past medical history of POTS.  Patient contacted the on-call cardiology line complaining of tachycardia and frequent recurrent syncopal episodes upon standing as well as CP and shortness of breath.  Patient states that this feels different to her usual POTS symptoms and is also endorsing right calf tenderness.  Because of this reason I think she should be ruled out for other causes like PE and I advised her to go to the nearest ER.

## 2022-12-18 NOTE — Progress Notes (Signed)
PCP:  Garnetta Buddy, NP Primary Cardiologist: Freada Bergeron, MD Electrophysiologist: Kiara Axe, MD   Kiara Brewer is a 22 y.o. female seen today for Kiara Axe, MD for post hospital follow up.    Seen in ED 2/13 for SOB and chest discomfort; Had been seen by PCP that am that reported EKG abnormalities.  She called PCP 2/18 with symptoms of tachycardia and recurrent syncopal episodes, as well as ongoing CP and SOB. Stated it felt different from her usual POTs symptoms, and also endorsed R calf tenderness. She was instructed to go to the nearest ED to again be evaluated for PE (D-dimer negative 2/13). D-dimer again negative. Tenderness potentially felt due to falls from syncope.   She has similar complaints today. She has SOB with any brief activity, and HRs rapidly go up into 130-140s when starting activity. She is having syncopal spells 2-3 times a day, mostly when changing position, but occasionally after prolonged standing.   She does describe loss of postural tone / falls.   This is activity limiting and she is having trouble attending classes due to her significant symptoms.   She has tried various compression garments with minimal benefit. Has tried abdominal binding, spanx, compression hose. Has not tried thigh sleeves.   She drinks about 1 g of fluid a day, with 2 liquid IV. She was told by [facility] to moderate her sodium intake, as they described her as having "Hyper POTS" and that too much sodium could worsen her symptoms instead of treat.   She has noticed waxing and waning benefit from bisoprolol and is interested in trying a separate.   She has not followed up with [facility] again since visit last fall.   Pt does state she had a viral illness at the beginning of January. It was associated with cough and congestion but NOT fever. She did not test for COVID/FLU/RSV.   Past Medical History:  Diagnosis Date   Anal pain    Anxiety    Arthritis    Complex regional pain syndrome of  both lower extremities    per pt w/ right thigh numbness   Depression    Ehlers-Danlos disease    Exercise-induced asthma    Fainting episodes    Fibromyalgia    GERD (gastroesophageal reflux disease)    History of concussion 04/2019   per pt no residual   History of Osgood-Schlatter disease    History of syncope    ED visit 08-29-2020 w/ cp, palpitations--- pt sent for cardiology evaluation , dr Quentin Ore, office note 09-07-2020 dx orthostatic intolerence ;   echo in epic 09-14-2020 , normal;  event monitor 09-13-2020 no signfiance arrhythmia , SR/ST;  CT angio chest 09-02-2020  normal   Irritable bowel    Migraine without aura    Orthostatic hypotension    POTS (postural orthostatic tachycardia syndrome)    Sacral radiculopathy    Spondylolisthesis, lumbosacral region    L5--S1   Wears contact lenses     Current Outpatient Medications  Medication Instructions   albuterol (PROVENTIL HFA;VENTOLIN HFA) 108 (90 Base) MCG/ACT inhaler 2 puffs, Inhalation, Every 6 hours PRN   ARIPiprazole (ABILIFY) 2 MG tablet 1 tablet, Oral, Daily   ARIPiprazole (ABILIFY) 2 mg, Oral, Daily   B Complex-C (SUPER B COMPLEX PO) Oral   buPROPion (WELLBUTRIN XL) 300 MG 24 hr tablet 1 tablet, Oral, Daily   calcium carbonate (TUMS - DOSED IN MG ELEMENTAL CALCIUM) 500 MG chewable tablet 1 tablet, Oral,  As needed   Cholecalciferol 50 MCG (2000 UT) CAPS Oral   DULoxetine (CYMBALTA) 30 mg, Oral, Daily   DULoxetine (CYMBALTA) 120 mg, Oral, Daily   EPINEPHrine 0.3 mg/0.3 mL IJ SOAJ injection Intramuscular   ipratropium (ATROVENT) 0.03 % nasal spray SMARTSIG:1 Spray(s) Both Nares 1-3 Times Daily PRN   ketoconazole (NIZORAL) 2 % shampoo 1 Application, Topical, 3 times weekly   Multiple Vitamin (MULTIVITAMIN) tablet 1 tablet, Oral, Daily   norethindrone-ethinyl estradiol (JUNEL 1/20) 1-20 MG-MCG tablet TAKE 1 TABLET BY MOUTH DAILY. TAKE CONTINUOUSLY, SKIP PLACEBO WEEK   Omega-3 Fatty Acids (FISH OIL) 1200 MG CAPS  Oral   ondansetron (ZOFRAN-ODT) 8 mg, Oral, Every 8 hours PRN   propranolol ER (INDERAL LA) 120 mg, Oral, Daily   tiZANidine (ZANAFLEX) 2-4 mg, Oral, 2 times daily PRN   Zepbound 5 mg, Subcutaneous, Weekly    Physical Exam: Vitals:   12/19/22 0858  BP: 108/60  Pulse: (!) 107  SpO2: 98%  Weight: 155 lb (70.3 kg)  Height: '5\' 7"'$  (1.702 m)    Orthostatic VS for the past 24 hrs (Last 3 readings):  BP- Lying Pulse- Lying BP- Sitting Pulse- Sitting BP- Standing at 0 minutes Pulse- Standing at 0 minutes BP- Standing at 3 minutes Pulse- Standing at 3 minutes  12/19/22 0859 108/60 107 (!) 88/58 117 (!) 84/54 135 98/60 140     GEN- NAD. A&O x 3. Normal affect. HEENT: normocephalic, atraumatic Lungs- CTAB, Normal effort Heart- Regular rate and rhythm, No M/G/R Extremities- No peripheral edema. no clubbing or cyanosis; Skin- warm and dry, no rash or lesion  EKG is not ordered today. EKG from 12/09/2022 reviewed which showed NSR at 90 bpm, nonspecific t wave abnormality,   Largely unchanged from prior  Additional studies reviewed include: Previous EP notes.   Tilt testing 07/2022                                BP (mmHg)        Heart Rate (BPM)              Supine           120/73           106              Tilt 1 min       104/71           143              Tilt 2 min       101/76           161              Tilt 3 min       96/63            168              Tilt 5 min       109/77           162              Tilt 10 min      110/80           150              Tilt -1 min      125/80           86   Comments on  Tilt: Patient was tilted for 10 minutes. Orthostatic hypotension was not detected. Heart rate response was excessive. Average head-up heart rate was 49 beats/min above baseline. The patient reported lightheadedness, dyspnea, warm and sweaty hands, chest pain, neck pain, and palpitations.   Assessment and Plan:  Recurrent syncope   Orthostatic intolerance   Complex  regional pain   Fibromyalgia   Anxiety/depression  With recent worsening symptoms. Of note, viral prodrome approx 6 weeks ago did not immediately involve worsening symptoms, so difficult to say if this is potential contributor.   Encouraged continued aggressive hydration, with sodium intake previously recommended by [facility]. I recommended she request virtual visit with [facility] to see if any further recommendations could be given based on prior discussion.  Dr. Caryl Comes had previously discussed florinef, but I am hesitant given [facility]'s recommendation to not "over do" sodium intake.   Per their notes "Autonomic testing shows unequivocal evidence of POTS. I recommend that the patient proceed with the POTS Treatment Program consultation." I stressed importance of re-engaging with them.   Will try propranolol 120 mg daily with her feeling as if she is having waning benefit from bisoprolol.   She had + orthostatics with both a drop in systolic BP, as well as marked increased in HR.  I have recommended she try thigh sleeves with and/or without abdominal compression   We discussed the role of salt and water repletion, the importance of exercise, often needing to be started in the recumbent position, and the awareness of triggers and the role of ambient heat and dehydration.  She is on OCP and does not have periods.   Salt supplements are best used with adjunctive sugar   Follow up with Dr. Caryl Comes for virtual visit in 3-4 weeks to assess if symptoms have improved on propranolol   Shirley Friar, PA-C  12/19/22 12:57 PM

## 2022-12-19 ENCOUNTER — Ambulatory Visit: Payer: 59 | Attending: Student | Admitting: Student

## 2022-12-19 VITALS — BP 108/60 | HR 107 | Ht 67.0 in | Wt 155.0 lb

## 2022-12-19 DIAGNOSIS — M797 Fibromyalgia: Secondary | ICD-10-CM | POA: Diagnosis not present

## 2022-12-19 DIAGNOSIS — R55 Syncope and collapse: Secondary | ICD-10-CM | POA: Diagnosis not present

## 2022-12-19 DIAGNOSIS — I951 Orthostatic hypotension: Secondary | ICD-10-CM

## 2022-12-19 DIAGNOSIS — G901 Familial dysautonomia [Riley-Day]: Secondary | ICD-10-CM

## 2022-12-19 MED ORDER — PROPRANOLOL HCL ER 120 MG PO CP24: 120.0000 mg | ORAL_CAPSULE | Freq: Every day | ORAL | 3 refills | Status: DC

## 2022-12-19 NOTE — Patient Instructions (Addendum)
Medication Instructions:  Your physician has recommended you make the following change in your medication:  1) STOP taking bisoprolol  2) START taking propanolol 120 mg daily  *If you need a refill on your cardiac medications before your next appointment, please call your pharmacy*  Follow-Up: At [facility], you and your health needs are our priority.  As part of our continuing mission to provide you with exceptional heart care, we have created designated Provider Care Teams.  These Care Teams include your primary Cardiologist (physician) and Advanced Practice Providers (APPs -  Physician Assistants and Nurse Practitioners) who all work together to provide you with the care you need, when you need it.  Your next appointment:   Virtual appointment on March 14th at 4:05 pm with Dr. Caryl Comes

## 2022-12-21 ENCOUNTER — Encounter: Payer: Self-pay | Admitting: Internal Medicine

## 2022-12-22 ENCOUNTER — Telehealth: Payer: Self-pay | Admitting: Student

## 2022-12-22 MED ORDER — PROPRANOLOL HCL ER 120 MG PO CP24: 120.0000 mg | ORAL_CAPSULE | Freq: Every day | ORAL | 3 refills | Status: DC

## 2022-12-22 MED ORDER — BISOPROLOL FUMARATE 10 MG PO TABS: 10.0000 mg | ORAL_TABLET | Freq: Every day | ORAL | 3 refills | Status: DC

## 2022-12-22 NOTE — Telephone Encounter (Addendum)
Spoke with pt via phone call.  Pt declines restarting Bisoprolol due to previous side effects.  Pt states she will continue Propranolol to see if side effects improve the longer she is on the medication.  Pt advised if her side effects are as severe as she described in her message she should not continue the Propranolol.  Pt states she will continue current Propranolol '120mg'$  -1 capsule by mouth daily.  Pt no longer sees the [facility] clinic now that she has a definitive diagnosis.  She has been referred back to Dr Caryl Comes for management.  Pt states she is well hydrated, salt replete and wearing compression garments as instructed.  She requests that information be forwarded back to PA due to Dr Caryl Comes being out of the office this week.  Pt advised will forward message as requested to Oda Kilts, PA-C and will notify with any further recommendations.  Reviewed ED precautions.  Pt verbalizes understanding and agrees with current plan.

## 2022-12-22 NOTE — Addendum Note (Signed)
Addended by: Thora Lance on: 12/22/2022 01:59 PM   Modules accepted: Orders

## 2022-12-22 NOTE — Telephone Encounter (Signed)
Spoke with pt, call was fwd to Keitha [place], Therapist, sports.  Please see 2/26 phone note.

## 2022-12-22 NOTE — Telephone Encounter (Signed)
Patient states she is returning April's call.

## 2022-12-26 ENCOUNTER — Encounter: Payer: 59 | Admitting: Physical Medicine and Rehabilitation

## 2023-01-08 ENCOUNTER — Telehealth: Payer: Self-pay

## 2023-01-08 ENCOUNTER — Encounter: Payer: Self-pay | Admitting: Internal Medicine

## 2023-01-08 ENCOUNTER — Ambulatory Visit: Payer: 59 | Attending: Internal Medicine | Admitting: Internal Medicine

## 2023-01-08 VITALS — HR 95 | Ht 67.0 in

## 2023-01-08 DIAGNOSIS — R55 Syncope and collapse: Secondary | ICD-10-CM

## 2023-01-08 DIAGNOSIS — G901 Familial dysautonomia [Riley-Day]: Secondary | ICD-10-CM

## 2023-01-08 DIAGNOSIS — I951 Orthostatic hypotension: Secondary | ICD-10-CM

## 2023-01-08 MED ORDER — FLUDROCORTISONE ACETATE 0.1 MG PO TABS: 0.1000 mg | ORAL_TABLET | Freq: Every day | ORAL | 3 refills | Status: DC

## 2023-01-08 NOTE — Telephone Encounter (Signed)
..   Pt understands that although there may be some limitations with this type of visit, we will take all precautions to reduce any security or privacy concerns.  Pt understands that this will be treated like an in office visit and we will file with pt's insurance, and there may be a patient responsible charge related to this service. ? ?

## 2023-01-08 NOTE — Progress Notes (Signed)
Electrophysiology TeleHealth Note      Date:  01/08/2023   ID:  Kiara Brewer, DOB October 16, 2001, MRN [id number]  Location: patient's home  Provider location: [address], [place]  Evaluation Performed: Follow-up visit  PCP:  Garnetta Buddy, NP  Cardiologist:    Electrophysiologist:  SK   Chief Complaint:  orthostatic tachycardia   History of Present Illness:    Kiara Brewer is a 22 y.o. female who presents via audio/video conferencing for a telehealth visit today.  Since last being seen in our clinic for symptoms consistent with dysautonomia and syncope which for unassociated with a complete loss of postural tone and closed eyes raising the concern of pseudo syncope but also with orthostatic vital signs concerning for POTS.  There have been some benefit with bisoprolol as she was seen then at the [facility].  The notes outlined the onset of symptoms following a trip to [place], postural headaches, and complex regional pain syndrome of her lower extremities 48-hour EEG with 3 "faint "or without EEG correlation; she underwent autonomic testing without evidence of autonomic failure and underwent tilt table testing with a delta heart rate of 106--168 and a delta blood pressure of 120--96; repeat echocardiogram was normal, abdominal CT raise the possibility of segmental inflammatory colitis or possibly infectious, perhaps notable in the context of her onset with a possible parasitic infection, internal medicine notes describe persistent symptoms of IBS and functional dyspepsia, the patient reports less tachypalpitations but also less warning with her syncopal events.    The patient denies symptoms of fevers, chills, cough, or new SOB worrisome for COVID 19.    Past Medical History:  Diagnosis Date   Anal pain    Anxiety    Arthritis    Complex regional pain syndrome of both lower extremities    per pt w/ right thigh numbness   Depression    Ehlers-Danlos disease     Exercise-induced asthma    Fainting episodes    Fibromyalgia    GERD (gastroesophageal reflux disease)    History of concussion 04/2019   per pt no residual   History of Osgood-Schlatter disease    History of syncope    ED visit 08-29-2020 w/ cp, palpitations--- pt sent for cardiology evaluation , dr Quentin Ore, office note 09-07-2020 dx orthostatic intolerence ;   echo in epic 09-14-2020 , normal;  event monitor 09-13-2020 no signfiance arrhythmia , SR/ST;  CT angio chest 09-02-2020  normal   Irritable bowel    Migraine without aura    Orthostatic hypotension    POTS (postural orthostatic tachycardia syndrome)    Sacral radiculopathy    Spondylolisthesis, lumbosacral region    L5--S1   Wears contact lenses     Past Surgical History:  Procedure Laterality Date   RECTAL EXAM UNDER ANESTHESIA N/A 11/22/2020   Procedure: ANAL  EXAM UNDER ANESTHESIA;  Surgeon: Leighton Ruff, MD;  Location: [place];  Service: General;  Laterality: N/A;   SPHINCTEROTOMY N/A 11/22/2020   Procedure: CHEMICAL SPHINCTEROTOMY/ FISSURECTOMY;  Surgeon: Leighton Ruff, MD;  Location: [place];  Service: General;  Laterality: N/A;   TONSILLECTOMY AND ADENOIDECTOMY  2010   WISDOM TOOTH EXTRACTION  2020    Current Outpatient Medications  Medication Sig Dispense Refill   albuterol (PROVENTIL HFA;VENTOLIN HFA) 108 (90 Base) MCG/ACT inhaler Inhale 2 puffs into the lungs every 6 (six) hours as needed for wheezing or shortness of breath. 1 Inhaler 3  ARIPiprazole (ABILIFY) 2 MG tablet Take 2 mg by mouth daily.     ARIPiprazole (ABILIFY) 2 MG tablet Take 1 tablet by mouth daily.     B Complex-C (SUPER B COMPLEX PO) Take by mouth.     buPROPion (WELLBUTRIN XL) 300 MG 24 hr tablet Take 1 tablet by mouth daily.     calcium carbonate (TUMS - DOSED IN MG ELEMENTAL CALCIUM) 500 MG chewable tablet Chew 1 tablet by mouth as needed for indigestion or heartburn.     Cholecalciferol 50 MCG (2000  UT) CAPS Take by mouth.     DULoxetine (CYMBALTA) 30 MG capsule Take 1 capsule (30 mg total) by mouth daily. 30 capsule 5   DULoxetine (CYMBALTA) 60 MG capsule TAKE 2 CAPSULES BY MOUTH DAILY 180 capsule 1   EPINEPHrine 0.3 mg/0.3 mL IJ SOAJ injection Inject into the muscle.     ipratropium (ATROVENT) 0.03 % nasal spray SMARTSIG:1 Spray(s) Both Nares 1-3 Times Daily PRN     ketoconazole (NIZORAL) 2 % shampoo Apply 1 Application topically 3 (three) times a week.     Multiple Vitamin (MULTIVITAMIN) tablet Take 1 tablet by mouth daily.     norethindrone-ethinyl estradiol (JUNEL 1/20) 1-20 MG-MCG tablet TAKE 1 TABLET BY MOUTH DAILY. TAKE CONTINUOUSLY, SKIP PLACEBO WEEK 112 tablet 3   Omega-3 Fatty Acids (FISH OIL) 1200 MG CAPS Take by mouth.     ondansetron (ZOFRAN-ODT) 8 MG disintegrating tablet Take 8 mg by mouth every 8 (eight) hours as needed for nausea or vomiting.     propranolol ER (INDERAL LA) 120 MG 24 hr capsule Take 1 capsule (120 mg total) by mouth daily. 90 capsule 3   tiZANidine (ZANAFLEX) 4 MG tablet TAKE 0.5-1 TABLETS (2-4 MG TOTAL) BY MOUTH 2 (TWO) TIMES DAILY AS NEEDED FOR MUSCLE SPASMS. 180 tablet 1   ZEPBOUND 5 MG/0.5ML Pen Inject 5 mg into the skin once a week.     Current Facility-Administered Medications  Medication Dose Route Frequency Provider Last Rate Last Admin   0.9 %  sodium chloride infusion  500 mL Intravenous Once Armbruster, Carlota Raspberry, MD        Allergies:   Gluten meal, Pecan extract, Strawberry extract, Beef allergy, Other, and Shellfish allergy   Social History:  The patient  reports that she has never smoked. She has never used smokeless tobacco. She reports that she does not drink alcohol and does not use drugs.   Family History:  The patient's   family history includes Alcohol abuse in her paternal grandfather; Anxiety disorder in her brother and brother; Arthritis in her maternal grandmother and mother; Asthma in her brother, brother, and mother; COPD in her  father and paternal grandfather; Cancer in her maternal grandmother, paternal grandfather, and paternal grandmother; Colon polyps in her maternal grandfather; Diabetes in her maternal grandfather; Heart attack in her maternal grandfather and paternal grandfather; Heart disease in her maternal grandfather and paternal grandfather; Hyperlipidemia in her maternal grandfather and paternal grandfather; Hypertension in her father, maternal grandfather, and paternal grandfather; Migraines in her brother, father, and mother; Miscarriages / Stillbirths in her maternal grandmother and mother; OCD in her brother; Stroke in her maternal grandfather.   ROS:  Please see the history of present illness.   All other systems are personally reviewed and negative.    Exam:    Vital Signs:  Pulse 95 Comment: pt provided  Ht '5\' 7"'$  (1.702 m)   SpO2 95% Comment: 95  BMI 24.28 kg/m  Labs/Other Tests and Data Reviewed:    Recent Labs: 12/09/2022: ALT 23; BUN 15; Creatinine, Ser 1.10; Hemoglobin 14.0; Platelets 304; Potassium 4.5; Sodium 136   Wt Readings from Last 3 Encounters:  12/19/22 155 lb (70.3 kg)  11/21/22 168 lb (76.2 kg)  10/07/22 179 lb (81.2 kg)     Other studies personally reviewed: Additional studies/ records that were reviewed today include: [facility]  Review of the above records today demonstrates: (As above)  Prior radiographs:      ASSESSMENT & PLAN:    Orthostatic tachycardia  Loss of consciousness spells question mechanism EEG negative, heart rhythm negative, closed eyes and complete loss of postural tone.  IBS/stress bleeding/dyspepsia  Joint hypermobility   Question was raised as to whether there is a cerebral fluid leak.  I never saw that this was resolved.  She is better on the propranolol, we will try again with low-dose fludrocortisone to try to help her maintain her volume so as to avoid the orthostatic trigger for tachycardia.  I will look  into the possibility of ivabradine as adjunctive as she has some degree of daytime tachycardia'  Will have her raise the head of her bed 4 inches to try to help with her nocturnal symptoms      Follow-up:  telehealth visit  3-4 weeks    Current medicines are reviewed at length with the patient today.   The patient  concerns regarding her medicines.  The following changes were made today: Fludrocortisone 0.1 mg daily  Labs/ tests ordered today include: Will plan metabolic profile No orders of the defined types were placed in this encounter.     Today, I have spent 22 minutes with the patient with telehealth technology discussing the above.  Signed, [name], MD  01/08/2023 5:24 PM     [facility] [address] [place] [address] [phone] (office) [phone] (fax)

## 2023-01-12 NOTE — Addendum Note (Signed)
Addended by: Thora Lance on: 01/12/2023 11:18 AM   Modules accepted: Orders

## 2023-01-12 NOTE — Patient Instructions (Addendum)
Medication Instructions:  Your physician has recommended you make the following change in your medication:   ** Begin Fludrocortisone 0.1mg  - 1 tablet by mouth daily.  *If you need a refill on your cardiac medications before your next appointment, please call your pharmacy*   Lab Work: BMET - please call [phone] to schedule lab appointment.  If you have labs (blood work) drawn today and your tests are completely normal, you will receive your results only by: [place] (if you have MyChart) OR A paper copy in the mail If you have any lab test that is abnormal or we need to change your treatment, we will call you to review the results.   Testing/Procedures: None ordered.    Follow-Up: At [facility], you and your health needs are our priority.  As part of our continuing mission to provide you with exceptional heart care, we have created designated Provider Care Teams.  These Care Teams include your primary Cardiologist (physician) and Advanced Practice Providers (APPs -  Physician Assistants and Nurse Practitioners) who all work together to provide you with the care you need, when you need it.  We recommend signing up for the patient portal called "MyChart".  Sign up information is provided on this After Visit Summary.  MyChart is used to connect with patients for Virtual Visits (Telemedicine).  Patients are able to view lab/test results, encounter notes, upcoming appointments, etc.  Non-urgent messages can be sent to your provider as well.   To learn more about what you can do with MyChart, go to [email or web address].    Your next appointment:   3-4 week telephone visit with Dr Caryl Comes - His scheduler will contact you.  Other Instructions Try raising the head of your bed 4 inches to see if this helps with nighttime symptoms.

## 2023-01-12 NOTE — Progress Notes (Unsigned)
[place] Phone: [phone] Subjective:   Fontaine No, am serving as a scribe for Dr. Hulan Saas.  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 female coming in with complaint of EDS. Patient states that her hips and lower back pain will worsen with workouts and colder temperatures. Patient takes Cymbalta 60mg  BID prn.   Patient also notes that joints in her wrist, ankles, and fingers have start to achy more within the past year and wonders if this is related to EDS.   Reviewing patient's chart has had syncopal episode as well as workup now for thyroid nodule as well as dysautonomia  Genetic findings is not consistent with any type of cardiac aspect that would be to concerning  Laboratory workup shows mild elevation in ALT but normal AST, normal sedimentation rate and vitamin D of 30  Previous imaging includes a CT abdomen and pelvis showing the patient does have a pars defect at L5 on S1  Normal MRI of the brain with and without contrast  MRI of the lumbar spine showed progressive disc degeneration with mild to moderate foraminal stenosis in 2021 at the L5 pars defect with the anterior listhesis of L5 on S1  Echocardiogram in 2021 was remarkably normal.  Past Medical History:  Diagnosis Date   Anal pain    Anxiety    Arthritis    Complex regional pain syndrome of both lower extremities    per pt w/ right thigh numbness   Depression    Ehlers-Danlos disease    Exercise-induced asthma    Fainting episodes    Fibromyalgia    GERD (gastroesophageal reflux disease)    History of concussion 04/2019   per pt no residual   History of Osgood-Schlatter disease    History of syncope    ED visit 08-29-2020 w/ cp, palpitations--- pt sent for cardiology evaluation , dr Quentin Ore, office note 09-07-2020 dx  orthostatic intolerence ;   echo in epic 09-14-2020 , normal;  event monitor 09-13-2020 no signfiance arrhythmia , SR/ST;  CT angio chest 09-02-2020  normal   Irritable bowel    Migraine without aura    Orthostatic hypotension    POTS (postural orthostatic tachycardia syndrome)    Sacral radiculopathy    Spondylolisthesis, lumbosacral region    L5--S1   Wears contact lenses    Past Surgical History:  Procedure Laterality Date   RECTAL EXAM UNDER ANESTHESIA N/A 11/22/2020   Procedure: ANAL  EXAM UNDER ANESTHESIA;  Surgeon: Leighton Ruff, MD;  Location: [facility];  Service: General;  Laterality: N/A;   SPHINCTEROTOMY N/A 11/22/2020   Procedure: CHEMICAL SPHINCTEROTOMY/ FISSURECTOMY;  Surgeon: Leighton Ruff, MD;  Location: [facility];  Service: General;  Laterality: N/A;   TONSILLECTOMY AND ADENOIDECTOMY  2010   WISDOM TOOTH EXTRACTION  2020   Social History   Socioeconomic History   Marital status: Married    Spouse name: Not on file   Number of children: Not on file   Years of education: Not on file   Highest education level: Not on file  Occupational History   Not on file  Tobacco Use   Smoking status: Never   Smokeless tobacco: Never  Vaping Use   Vaping Use: Never used  Substance and Sexual Activity   Alcohol use: Never  Drug use: Never   Sexual activity: Yes    Partners: Male    Birth control/protection: Pill  Other Topics Concern   Not on file  Social History Narrative   [name] is in the 12th grade at [employer]; she does well academically. She lives with her parents. She enjoys going to the gym, boxing, and playing with animals   Right handed   Drinks caffeine   An apartment one story    Social Determinants of Health   Financial Resource Strain: Not on file  Food Insecurity: No Food Insecurity (08/09/2022)   Hunger Vital Sign    Worried About Running Out of Food in the Last Year: Never true    Ran Out of Food in the Last Year:  Never true  Transportation Needs: No Transportation Needs (08/09/2022)   PRAPARE - Hydrologist (Medical): No    Lack of Transportation (Non-Medical): No  Physical Activity: Not on file  Stress: Not on file  Social Connections: Not on file   Allergies  Allergen Reactions   Gluten Meal     Other reaction(s): GI intolerance Vomiting   Pecan Extract Anaphylaxis and Hives   Strawberry Extract Anaphylaxis and Hives   Beef Allergy Hives    Other reaction(s): GI intolerance RED MEAT   Other Hives    Red Meat  and chicken   Shellfish Allergy Hives    lobster Other reaction(s): Other lobster lobster  lobster Other reaction(s): Other lobster   Family History  Problem Relation Age of Onset   Arthritis Mother    Asthma Mother    Miscarriages / Stillbirths Mother    Migraines Mother    COPD Father        per pt's mother-mild   Hypertension Father    Migraines Father    Asthma Brother    Migraines Brother    Anxiety disorder Brother    Arthritis Maternal Grandmother    Cancer Maternal Grandmother    Miscarriages / Stillbirths Maternal Grandmother    Diabetes Maternal Grandfather    Heart attack Maternal Grandfather    Heart disease Maternal Grandfather    Hyperlipidemia Maternal Grandfather    Hypertension Maternal Grandfather    Stroke Maternal Grandfather    Colon polyps Maternal Grandfather    Cancer Paternal Grandmother    Alcohol abuse Paternal Grandfather    Cancer Paternal Grandfather    COPD Paternal Grandfather    Heart attack Paternal Grandfather    Heart disease Paternal Grandfather    Hyperlipidemia Paternal Grandfather    Hypertension Paternal Grandfather    Asthma Brother    OCD Brother    Anxiety disorder Brother    Seizures Neg Hx    Depression Neg Hx    Bipolar disorder Neg Hx    Schizophrenia Neg Hx    ADD / ADHD Neg Hx    Autism Neg Hx    Colon cancer Neg Hx    Esophageal cancer Neg Hx    Rectal cancer Neg Hx     Stomach cancer Neg Hx     Current Outpatient Medications (Endocrine & Metabolic):    fludrocortisone (FLORINEF) 0.1 MG tablet, Take 1 tablet (0.1 mg total) by mouth daily.   norethindrone-ethinyl estradiol (JUNEL 1/20) 1-20 MG-MCG tablet, TAKE 1 TABLET BY MOUTH DAILY. TAKE CONTINUOUSLY, SKIP PLACEBO WEEK   Current Outpatient Medications (Cardiovascular):    EPINEPHrine 0.3 mg/0.3 mL IJ SOAJ injection, Inject into the muscle.   propranolol ER (INDERAL LA)  120 MG 24 hr capsule, Take 1 capsule (120 mg total) by mouth daily.   Current Outpatient Medications (Respiratory):    albuterol (PROVENTIL HFA;VENTOLIN HFA) 108 (90 Base) MCG/ACT inhaler, Inhale 2 puffs into the lungs every 6 (six) hours as needed for wheezing or shortness of breath.   ipratropium (ATROVENT) 0.03 % nasal spray, SMARTSIG:1 Spray(s) Both Nares 1-3 Times Daily PRN       Current Outpatient Medications (Other):    ARIPiprazole (ABILIFY) 2 MG tablet, Take 2 mg by mouth daily.   ARIPiprazole (ABILIFY) 2 MG tablet, Take 1 tablet by mouth daily.   B Complex-C (SUPER B COMPLEX PO), Take by mouth.   buPROPion (WELLBUTRIN XL) 300 MG 24 hr tablet, Take 1 tablet by mouth daily.   calcium carbonate (TUMS - DOSED IN MG ELEMENTAL CALCIUM) 500 MG chewable tablet, Chew 1 tablet by mouth as needed for indigestion or heartburn.   Cholecalciferol 50 MCG (2000 UT) CAPS, Take by mouth.   DULoxetine (CYMBALTA) 60 MG capsule, TAKE 2 CAPSULES BY MOUTH DAILY   ketoconazole (NIZORAL) 2 % shampoo, Apply 1 Application topically 3 (three) times a week.   Multiple Vitamin (MULTIVITAMIN) tablet, Take 1 tablet by mouth daily.   Omega-3 Fatty Acids (FISH OIL) 1200 MG CAPS, Take by mouth.   ondansetron (ZOFRAN-ODT) 8 MG disintegrating tablet, Take 8 mg by mouth every 8 (eight) hours as needed for nausea or vomiting.   tiZANidine (ZANAFLEX) 4 MG tablet, TAKE 0.5-1 TABLETS (2-4 MG TOTAL) BY MOUTH 2 (TWO) TIMES DAILY AS NEEDED FOR MUSCLE SPASMS.    ZEPBOUND 5 MG/0.5ML Pen, Inject 5 mg into the skin once a week.  Current Facility-Administered Medications (Other):    0.9 %  sodium chloride infusion   Reviewed prior external information including notes and imaging from  primary care provider As well as notes that were available from care everywhere and other healthcare systems.  Past medical history, social, surgical and family history all reviewed in electronic medical record.  No pertanent information unless stated regarding to the chief complaint.   Review of Systems:  No headache, visual changes, nausea, vomiting, diarrhea, constipation, dizziness, abdominal pain, skin rash, fevers, chills, night sweats, weight loss, swollen lymph nodes, body aches, joint swelling, chest pain, shortness of breath, mood changes. POSITIVE muscle aches, joint pain  Objective  Pulse 73, height 5\' 7"  (1.702 m), weight 152 lb (68.9 kg), SpO2 98 %.   General: No apparent distress alert and oriented x3 mood and affect normal, dressed appropriately.  HEENT: Pupils equal, extraocular movements intact  Respiratory: Patient's speak in full sentences and does not appear short of breath  Cardiovascular: No lower extremity edema, non tender, no erythema  Mild hypermobility noted.  Beighton score 6  Low back does have some loss of lordosis noted.  Some mild scoliosis noted in the thoracolumbar juncture everywhere.   Impression and Recommendations:     The above documentation has been reviewed and is accurate and complete Lyndal Pulley, DO

## 2023-01-13 ENCOUNTER — Ambulatory Visit (INDEPENDENT_AMBULATORY_CARE_PROVIDER_SITE_OTHER): Payer: 59 | Admitting: Family Medicine

## 2023-01-13 ENCOUNTER — Encounter: Payer: Self-pay | Admitting: Family Medicine

## 2023-01-13 VITALS — HR 73 | Ht 67.0 in | Wt 152.0 lb

## 2023-01-13 DIAGNOSIS — M419 Scoliosis, unspecified: Secondary | ICD-10-CM | POA: Diagnosis not present

## 2023-01-13 DIAGNOSIS — M4317 Spondylolisthesis, lumbosacral region: Secondary | ICD-10-CM | POA: Diagnosis not present

## 2023-01-13 NOTE — Patient Instructions (Signed)
Vit C 1000mg  daily  CoQ10 200mg   Choline 500mg  daily Exercises  If back gets worse, can consider MRI See me again in 2 months

## 2023-01-13 NOTE — Assessment & Plan Note (Addendum)
Concerned that patient does have more of a spondylolisthesis noted.  Patient states that she has been diagnosed with the hypermobility and potentially her longstanding low syndrome.  Is extremely concerned with what we find at this point but does have underlying fascial pain that does go with even the complex regional pain syndrome.  We discussed with patient that I do feel that if we continue to have pain I like to consider the possibility of advanced imaging of the low back again.  Patient does have the pars defect with the anterior listhesis of L5 on S1 and if there is any nerve impingement could be a candidate for possible epidurals.  We discussed that we could consider different medications including gabapentin for breakthrough.  Is doing very well with the Cymbalta at the moment.  Patient would like to avoid the osteopathic manipulation at this point.  Given more of isometrics and strengthening.  Follow-up with me again in 2 months.

## 2023-01-13 NOTE — Assessment & Plan Note (Signed)
Mild noticed on exam today.

## 2023-01-15 ENCOUNTER — Encounter: Payer: Self-pay | Admitting: Neurology

## 2023-01-16 ENCOUNTER — Ambulatory Visit: Payer: 59 | Attending: Cardiovascular Disease

## 2023-01-16 DIAGNOSIS — G901 Familial dysautonomia [Riley-Day]: Secondary | ICD-10-CM

## 2023-01-16 DIAGNOSIS — I951 Orthostatic hypotension: Secondary | ICD-10-CM

## 2023-01-16 DIAGNOSIS — R55 Syncope and collapse: Secondary | ICD-10-CM

## 2023-01-17 LAB — BASIC METABOLIC PANEL
BUN/Creatinine Ratio: 11 (ref 9–23)
BUN: 13 mg/dL (ref 6–20)
CO2: 19 mmol/L — ABNORMAL LOW (ref 20–29)
Calcium: 10.1 mg/dL (ref 8.7–10.2)
Chloride: 104 mmol/L (ref 96–106)
Creatinine, Ser: 1.22 mg/dL — ABNORMAL HIGH (ref 0.57–1.00)
Glucose: 94 mg/dL (ref 70–99)
Potassium: 4.6 mmol/L (ref 3.5–5.2)
Sodium: 139 mmol/L (ref 134–144)
eGFR: 65 mL/min/{1.73_m2} (ref >59)

## 2023-01-19 ENCOUNTER — Other Ambulatory Visit: Payer: Self-pay | Admitting: Neurology

## 2023-01-19 MED ORDER — ELETRIPTAN HYDROBROMIDE 40 MG PO TABS: 40.0000 mg | ORAL_TABLET | ORAL | 5 refills | Status: DC | PRN

## 2023-01-19 MED ORDER — ONDANSETRON 8 MG PO TBDP: 8.0000 mg | ORAL_TABLET | Freq: Three times a day (TID) | ORAL | 5 refills | Status: AC | PRN

## 2023-01-20 ENCOUNTER — Encounter: Payer: Self-pay | Admitting: Internal Medicine

## 2023-01-21 ENCOUNTER — Encounter: Payer: Self-pay | Admitting: Family Medicine

## 2023-02-02 NOTE — Telephone Encounter (Signed)
We can try ivabradine,  people have used it with hyperadrenergic  POTS    starts at 5 bid-- if her insurance doesn't cover, we can give hre Rx to get affordably from [place] Thanks SK

## 2023-02-04 ENCOUNTER — Encounter: Payer: Self-pay | Admitting: Physical Medicine and Rehabilitation

## 2023-02-05 MED ORDER — TIZANIDINE HCL 4 MG PO TABS: 2.0000 mg | ORAL_TABLET | Freq: Two times a day (BID) | ORAL | 1 refills | Status: DC | PRN

## 2023-02-09 ENCOUNTER — Telehealth: Payer: Self-pay

## 2023-02-09 ENCOUNTER — Ambulatory Visit: Payer: 59 | Attending: Cardiovascular Disease | Admitting: Internal Medicine

## 2023-02-09 ENCOUNTER — Encounter: Payer: Self-pay | Admitting: Internal Medicine

## 2023-02-09 VITALS — HR 98 | Ht 67.0 in

## 2023-02-09 DIAGNOSIS — G901 Familial dysautonomia [Riley-Day]: Secondary | ICD-10-CM | POA: Diagnosis not present

## 2023-02-09 DIAGNOSIS — I951 Orthostatic hypotension: Secondary | ICD-10-CM

## 2023-02-09 NOTE — Telephone Encounter (Signed)
..   Pt understands that although there may be some limitations with this type of visit, we will take all precautions to reduce any security or privacy concerns.  Pt understands that this will be treated like an in office visit and we will file with pt's insurance, and there may be a patient responsible charge related to this service. ? ?

## 2023-02-09 NOTE — Patient Instructions (Signed)
Medication Instructions:  Your physician recommends that you continue on your current medications as directed. Please refer to the Current Medication list given to you today.  *If you need a refill on your cardiac medications before your next appointment, please call your pharmacy*   Lab Work: None ordered.  If you have labs (blood work) drawn today and your tests are completely normal, you will receive your results only by: MyChart Message (if you have MyChart) OR A paper copy in the mail If you have any lab test that is abnormal or we need to change your treatment, we will call you to review the results.   Testing/Procedures: None ordered.    Follow-Up: At [facility], you and your health needs are our priority.  As part of our continuing mission to provide you with exceptional heart care, we have created designated Provider Care Teams.  These Care Teams include your primary Cardiologist (physician) and Advanced Practice Providers (APPs -  Physician Assistants and Nurse Practitioners) who all work together to provide you with the care you need, when you need it.  We recommend signing up for the patient portal called "MyChart".  Sign up information is provided on this After Visit Summary.  MyChart is used to connect with patients for Virtual Visits (Telemedicine).  Patients are able to view lab/test results, encounter notes, upcoming appointments, etc.  Non-urgent messages can be sent to your provider as well.   To learn more about what you can do with MyChart, go to [email or web address].    Your next appointment:

## 2023-02-09 NOTE — Progress Notes (Signed)
Electrophysiology TeleHealth Note      Date:  02/09/2023   ID:  Kiara Brewer, DOB 2001/01/19, MRN [id number]  Location: patient's home  Provider location: [address], [place]  Evaluation Performed: Follow-up visit  PCP:  Patrick Jupiter, NP  Cardiologist:    Electrophysiologist:  SK   Chief Complaint:  orthostatic tachycardia   History of Present Illness:    Kiara Brewer is a 22 y.o. female who presents via audio/video conferencing for a telehealth visit today.  Since last being seen in our clinic for symptoms consistent with dysautonomia and syncope which for unassociated with a complete loss of postural tone and closed eyes raising the concern of pseudo syncope but also with orthostatic vital signs concerning for POTS.  There have been some benefit with bisoprolol as she was seen then at the [facility].  The notes outlined the onset of symptoms following a trip to [place], postural headaches, and complex regional pain syndrome of her lower extremities 48-hour EEG with 3 "faint "or without EEG correlation; she underwent autonomic testing without evidence of autonomic failure and underwent tilt table testing with a delta heart rate of 106--168 and a delta blood pressure of 120--96; repeat echocardiogram was normal, abdominal CT raise the possibility of segmental inflammatory colitis or possibly infectious, perhaps notable in the context of her onset with a possible parasitic infection, internal medicine notes describe persistent symptoms of IBS and functional dyspepsia, the patient reports less tachypalpitations but also less warning with her syncopal events.  3/24 tried on fludrocortisone but this was discontinued because of aggravation of her migraine headaches. With improvement following discontinuation.    I did some reading and ivabradine was also something recommended that we might be able to try.  Continues typical prodrome ==  but not passing out--  Bad   headaches ( eyes feel like they are going to pop out of her head) with shortness of breath HR 130s-140s--relieved by closing eyes, dissapates thereafter.  Not relieved by changes in position-- just needs to stop.   Also has noted separate bone pain behind ears   Compression has been intolerable because of heats.   Finish exams may 8 >> program [facility] XRT now at [facility]   The patient denies symptoms of fevers, chills, cough, or new SOB worrisome for COVID 19.    Past Medical History:  Diagnosis Date   Anal pain    Anxiety    Arthritis    Complex regional pain syndrome of both lower extremities    per pt w/ right thigh numbness   Depression    Ehlers-Danlos disease    Exercise-induced asthma    Fainting episodes    Fibromyalgia    GERD (gastroesophageal reflux disease)    History of concussion 04/2019   per pt no residual   History of Osgood-Schlatter disease    History of syncope    ED visit 08-29-2020 w/ cp, palpitations--- pt sent for cardiology evaluation , dr Lalla Brothers, office note 09-07-2020 dx orthostatic intolerence ;   echo in epic 09-14-2020 , normal;  event monitor 09-13-2020 no signfiance arrhythmia , SR/ST;  CT angio chest 09-02-2020  normal   Irritable bowel    Migraine without aura    Orthostatic hypotension    POTS (postural orthostatic tachycardia syndrome)    Sacral radiculopathy    Spondylolisthesis, lumbosacral region    L5--S1   Wears contact lenses     Past Surgical History:  Procedure Laterality  Date   RECTAL EXAM UNDER ANESTHESIA N/A 11/22/2020   Procedure: ANAL  EXAM UNDER ANESTHESIA;  Surgeon: Romie Levee, MD;  Location: [facility];  Service: General;  Laterality: N/A;   SPHINCTEROTOMY N/A 11/22/2020   Procedure: CHEMICAL SPHINCTEROTOMY/ FISSURECTOMY;  Surgeon: Romie Levee, MD;  Location: [facility];  Service: General;  Laterality: N/A;   TONSILLECTOMY AND ADENOIDECTOMY  2010   WISDOM TOOTH EXTRACTION  2020    Current  Outpatient Medications  Medication Sig Dispense Refill   albuterol (PROVENTIL HFA;VENTOLIN HFA) 108 (90 Base) MCG/ACT inhaler Inhale 2 puffs into the lungs every 6 (six) hours as needed for wheezing or shortness of breath. 1 Inhaler 3   ARIPiprazole (ABILIFY) 2 MG tablet Take 2 mg by mouth daily.     B Complex-C (SUPER B COMPLEX PO) Take by mouth.     buPROPion (WELLBUTRIN XL) 300 MG 24 hr tablet Take 1 tablet by mouth daily.     calcium carbonate (TUMS - DOSED IN MG ELEMENTAL CALCIUM) 500 MG chewable tablet Chew 1 tablet by mouth as needed for indigestion or heartburn.     Cholecalciferol 50 MCG (2000 UT) CAPS Take by mouth.     DULoxetine (CYMBALTA) 60 MG capsule TAKE 2 CAPSULES BY MOUTH DAILY 180 capsule 1   eletriptan (RELPAX) 40 MG tablet Take 1 tablet (40 mg total) by mouth as needed for migraine or headache. May repeat in 2 hours if headache persists or recurs.  Maximum 2 tablets in 24 hours. 10 tablet 5   EPINEPHrine 0.3 mg/0.3 mL IJ SOAJ injection Inject into the muscle.     fludrocortisone (FLORINEF) 0.1 MG tablet Take 1 tablet (0.1 mg total) by mouth daily. 90 tablet 3   ipratropium (ATROVENT) 0.03 % nasal spray SMARTSIG:1 Spray(s) Both Nares 1-3 Times Daily PRN     ketoconazole (NIZORAL) 2 % shampoo Apply 1 Application topically 3 (three) times a week.     Multiple Vitamin (MULTIVITAMIN) tablet Take 1 tablet by mouth daily.     norethindrone-ethinyl estradiol (JUNEL 1/20) 1-20 MG-MCG tablet TAKE 1 TABLET BY MOUTH DAILY. TAKE CONTINUOUSLY, SKIP PLACEBO WEEK 112 tablet 3   Omega-3 Fatty Acids (FISH OIL) 1200 MG CAPS Take by mouth.     ondansetron (ZOFRAN-ODT) 8 MG disintegrating tablet Take 1 tablet (8 mg total) by mouth every 8 (eight) hours as needed for nausea or vomiting. 20 tablet 5   propranolol ER (INDERAL LA) 120 MG 24 hr capsule Take 1 capsule (120 mg total) by mouth daily. 90 capsule 3   tiZANidine (ZANAFLEX) 4 MG tablet Take 0.5-1 tablets (2-4 mg total) by mouth 2 (two) times  daily as needed for muscle spasms. 180 tablet 1   ZEPBOUND 5 MG/0.5ML Pen Inject 5 mg into the skin once a week.     ARIPiprazole (ABILIFY) 2 MG tablet Take 1 tablet by mouth daily.     Current Facility-Administered Medications  Medication Dose Route Frequency Provider Last Rate Last Admin   0.9 %  sodium chloride infusion  500 mL Intravenous Once Armbruster, Willaim Rayas, MD        Allergies:   Gluten meal, Pecan extract, Strawberry extract, Beef allergy, Other, and Shellfish allergy   Social History:  The patient  reports that she has never smoked. She has never used smokeless tobacco. She reports that she does not drink alcohol and does not use drugs.   Family History:  The patient's   family history includes Alcohol abuse in  her paternal grandfather; Anxiety disorder in her brother and brother; Arthritis in her maternal grandmother and mother; Asthma in her brother, brother, and mother; COPD in her father and paternal grandfather; Cancer in her maternal grandmother, paternal grandfather, and paternal grandmother; Colon polyps in her maternal grandfather; Diabetes in her maternal grandfather; Heart attack in her maternal grandfather and paternal grandfather; Heart disease in her maternal grandfather and paternal grandfather; Hyperlipidemia in her maternal grandfather and paternal grandfather; Hypertension in her father, maternal grandfather, and paternal grandfather; Migraines in her brother, father, and mother; Miscarriages / Stillbirths in her maternal grandmother and mother; OCD in her brother; Stroke in her maternal grandfather.   ROS:  Please see the history of present illness.   All other systems are personally reviewed and negative.    Exam:    Vital Signs:  Pulse 98 Comment: patient provided  Ht  (1.702 m)   BMI 23.81 kg/m         Labs/Other Tests and Data Reviewed:    Recent Labs: 12/09/2022: ALT 23; Hemoglobin 14.0; Platelets 304 01/16/2023: BUN 13; Creatinine, Ser 1.22;  Potassium 4.6; Sodium 139   Wt Readings from Last 3 Encounters:  01/13/23 152 lb (68.9 kg)  12/19/22 155 lb (70.3 kg)  11/21/22 168 lb (76.2 kg)     Other studies personally reviewed: Additional studies/ records that were reviewed today include: extensive review of [facility] records  Review of the above records today demonstrates: (As above)  Prior radiographs:      ASSESSMENT & PLAN:    Orthostatic tachycardia  Loss of consciousness spells question mechanism EEG negative, heart rhythm negative, closed eyes and complete loss of postural tone.  IBS/stress bleeding/dyspepsia  Joint hypermobility   [email or web address] [facility] at [facility]  [email or web address] for ivabradine generic   Will reach out to [employer] re clonidine--as sympatholytic   Message to [place] regarding eyespopping out  Follow-up:  telehealth 6-8 weeks     Current medicines are reviewed at length with the patient today.   The patient  concerns regarding her medicines.  The following changes were made today: Fludrocortisone 0.1 mg daily  Labs/ tests ordered today include: Will plan metabolic profile No orders of the defined types were placed in this encounter.     Today, I have spent 26 minutes with the patient with telehealth technology discussing the above.  Signed, Sherryl Manges, MD  02/09/2023 5:01 PM     [facility] [address] [place] [address] [phone] (office) [phone] (fax)

## 2023-02-10 MED ORDER — IVABRADINE HCL 5 MG PO TABS: 5.0000 mg | ORAL_TABLET | Freq: Two times a day (BID) | ORAL | 11 refills | Status: DC

## 2023-02-11 ENCOUNTER — Telehealth: Payer: Self-pay

## 2023-02-11 NOTE — Telephone Encounter (Signed)
Tried calling patient to see how she is doing and to advised to schedule a follow up visit with Dr.Jaffe.  Kiara Brewer

## 2023-02-11 NOTE — Telephone Encounter (Signed)
-----   Message from Drema Dallas, DO sent at 02/09/2023  7:27 PM EDT ----- We need to get this patient scheduled for follow up.  She has been having headaches and doesn't have an upcoming appointment

## 2023-02-11 NOTE — Telephone Encounter (Signed)
**Note De-Identified Kiara Brewer Obfuscation** Per the pts [facility] message, I called [facility] at [phone] and did a [place] PA with Kiara Brewer. Per Kiara Brewer, this PA has been approved until 02/11/2023. Case ID: [id number]  I have notified [facility] [phone] - , Kiara Brewer - [address] (Ph: [phone]) of this approval and I did request that they fill and to notify the pt when ready for pick up.  I notified the pt of this approval Kiara Brewer her [facility] message.

## 2023-02-12 ENCOUNTER — Emergency Department (HOSPITAL_COMMUNITY): Payer: 59

## 2023-02-12 ENCOUNTER — Emergency Department (HOSPITAL_COMMUNITY)
Admission: EM | Admit: 2023-02-12 | Discharge: 2023-02-12 | Disposition: A | Discharge status: Home / Self Care | Payer: 59 | Attending: Emergency Medicine | Admitting: Emergency Medicine

## 2023-02-12 ENCOUNTER — Encounter (HOSPITAL_COMMUNITY): Payer: Self-pay

## 2023-02-12 ENCOUNTER — Other Ambulatory Visit: Payer: Self-pay

## 2023-02-12 DIAGNOSIS — N760 Acute vaginitis: Secondary | ICD-10-CM | POA: Diagnosis not present

## 2023-02-12 DIAGNOSIS — D72829 Elevated white blood cell count, unspecified: Secondary | ICD-10-CM | POA: Insufficient documentation

## 2023-02-12 DIAGNOSIS — R102 Pelvic and perineal pain: Secondary | ICD-10-CM | POA: Diagnosis present

## 2023-02-12 DIAGNOSIS — B9689 Other specified bacterial agents as the cause of diseases classified elsewhere: Secondary | ICD-10-CM

## 2023-02-12 DIAGNOSIS — R1030 Lower abdominal pain, unspecified: Secondary | ICD-10-CM | POA: Diagnosis not present

## 2023-02-12 LAB — CBC WITH DIFFERENTIAL/PLATELET
Abs Immature Granulocytes: 0.07 10*3/uL (ref 0.00–0.07)
Basophils Absolute: 0.1 10*3/uL (ref 0.0–0.1)
Basophils Relative: 0 %
Eosinophils Absolute: 0 10*3/uL (ref 0.0–0.5)
Eosinophils Relative: 0 %
HCT: 35.8 % — ABNORMAL LOW (ref 36.0–46.0)
Hemoglobin: 12.8 g/dL (ref 12.0–15.0)
Immature Granulocytes: 1 %
Lymphocytes Relative: 9 %
Lymphs Abs: 1.1 10*3/uL (ref 0.7–4.0)
MCH: 32.2 pg (ref 26.0–34.0)
MCHC: 35.8 g/dL (ref 30.0–36.0)
MCV: 90.2 fL (ref 80.0–100.0)
Monocytes Absolute: 0.9 10*3/uL (ref 0.1–1.0)
Monocytes Relative: 8 %
Neutro Abs: 10.4 10*3/uL — ABNORMAL HIGH (ref 1.7–7.7)
Neutrophils Relative %: 82 %
Platelets: 287 10*3/uL (ref 150–400)
RBC: 3.97 MIL/uL (ref 3.87–5.11)
RDW: 11.7 % (ref 11.5–15.5)
WBC: 12.5 10*3/uL — ABNORMAL HIGH (ref 4.0–10.5)
nRBC: 0 % (ref 0.0–0.2)

## 2023-02-12 LAB — COMPREHENSIVE METABOLIC PANEL
ALT: 32 U/L (ref 0–44)
AST: 18 U/L (ref 15–41)
Albumin: 4.3 g/dL (ref 3.5–5.0)
Alkaline Phosphatase: 36 U/L — ABNORMAL LOW (ref 38–126)
Anion gap: 10 (ref 5–15)
BUN: 15 mg/dL (ref 6–20)
CO2: 20 mmol/L — ABNORMAL LOW (ref 22–32)
Calcium: 9.2 mg/dL (ref 8.9–10.3)
Chloride: 103 mmol/L (ref 98–111)
Creatinine, Ser: 1.12 mg/dL — ABNORMAL HIGH (ref 0.44–1.00)
GFR, Estimated: >60 mL/min (ref >60)
Glucose, Bld: 98 mg/dL (ref 70–99)
Potassium: 4.3 mmol/L (ref 3.5–5.1)
Sodium: 133 mmol/L — ABNORMAL LOW (ref 135–145)
Total Bilirubin: 1.6 mg/dL — ABNORMAL HIGH (ref 0.3–1.2)
Total Protein: 7.6 g/dL (ref 6.5–8.1)

## 2023-02-12 LAB — URINALYSIS, ROUTINE W REFLEX MICROSCOPIC
Bilirubin Urine: NEGATIVE
Glucose, UA: NEGATIVE mg/dL
Hgb urine dipstick: NEGATIVE
Ketones, ur: 20 mg/dL — AB
Leukocytes,Ua: NEGATIVE
Nitrite: NEGATIVE
Protein, ur: NEGATIVE mg/dL
Specific Gravity, Urine: 1.019 (ref 1.005–1.030)
pH: 5 (ref 5.0–8.0)

## 2023-02-12 LAB — WET PREP, GENITAL
Sperm: NONE SEEN
Trich, Wet Prep: NONE SEEN
WBC, Wet Prep HPF POC: >=10 — AB (ref <10)
Yeast Wet Prep HPF POC: NONE SEEN

## 2023-02-12 LAB — LIPASE, BLOOD: Lipase: 30 U/L (ref 11–51)

## 2023-02-12 LAB — PREGNANCY, URINE: Preg Test, Ur: NEGATIVE

## 2023-02-12 MED ORDER — MORPHINE SULFATE (PF) 4 MG/ML IV SOLN
4.0000 mg | Freq: Once | INTRAVENOUS | Status: AC
  Administered 2023-02-12: 4 mg via INTRAVENOUS
  Filled 2023-02-12: qty 1

## 2023-02-12 MED ORDER — SODIUM CHLORIDE 0.9 % IV BOLUS
1000.0000 mL | Freq: Once | INTRAVENOUS | Status: AC
  Administered 2023-02-12: 1000 mL via INTRAVENOUS

## 2023-02-12 MED ORDER — MELOXICAM 7.5 MG PO TABS: 7.5000 mg | ORAL_TABLET | Freq: Every day | ORAL | 0 refills | Status: AC

## 2023-02-12 MED ORDER — ONDANSETRON 4 MG PO TBDP: 4.0000 mg | ORAL_TABLET | Freq: Four times a day (QID) | ORAL | 0 refills | Status: DC | PRN

## 2023-02-12 MED ORDER — ONDANSETRON HCL 4 MG/2ML IJ SOLN
4.0000 mg | Freq: Once | INTRAMUSCULAR | Status: AC
  Administered 2023-02-12: 4 mg via INTRAVENOUS
  Filled 2023-02-12: qty 2

## 2023-02-12 MED ORDER — METRONIDAZOLE 500 MG PO TABS
500.0000 mg | ORAL_TABLET | Freq: Once | ORAL | Status: AC
  Administered 2023-02-12: 500 mg via ORAL
  Filled 2023-02-12: qty 1

## 2023-02-12 MED ORDER — METRONIDAZOLE 500 MG PO TABS: 500.0000 mg | ORAL_TABLET | Freq: Two times a day (BID) | ORAL | 0 refills | Status: DC

## 2023-02-12 NOTE — Discharge Instructions (Addendum)
For pain in your left pelvic area, your wet mount did show bacterial vaginitis and you are been given prescription for Flagyl for this.  You are being given Zofran to help with the nausea and Mobic to help with pain.  You can also take Tylenol over-the-counter and use a heating pad.  Follow-up closely with primary care and gynecology.  Come back for any new or worsening symptoms.

## 2023-02-12 NOTE — ED Provider Notes (Signed)
[place] EMERGENCY DEPARTMENT AT [facility] Provider Note   CSN: [id number] Arrival date & time: 02/12/23  1124     History  Chief Complaint  Patient presents with   Abdominal Pain   Nausea    Kiara Brewer is a 22 y.o. female with PMH of POTS, fibromyalgia, hypermobility syndrome, proximal regional pain syndrome presents to ER complaining of left pelvic pain that started several days ago, seen yesterday in the ED at [facility] and had ultrasound of her pelvis and CT abdomen pelvis which were normal but she states the pain has come worse today, is worse with walking, she having some nausea as well, no fevers.  She denies concern for STIs, sexually active only with her husband.  Denies vaginal symptoms.   Abdominal Pain      Home Medications Prior to Admission medications   Medication Sig Start Date End Date Taking? Authorizing Provider  meloxicam (MOBIC) 7.5 MG tablet Take 1 tablet (7.5 mg total) by mouth daily. 02/12/23  Yes Ausha Sieh A, PA-C  metroNIDAZOLE (FLAGYL) 500 MG tablet Take 1 tablet (500 mg total) by mouth 2 (two) times daily. 02/12/23  Yes Kiwana Deblasi A, PA-C  ondansetron (ZOFRAN-ODT) 4 MG disintegrating tablet Take 1 tablet (4 mg total) by mouth every 6 (six) hours as needed for nausea or vomiting. 02/12/23  Yes Rain Wilhide A, PA-C  albuterol (PROVENTIL HFA;VENTOLIN HFA) 108 (90 Base) MCG/ACT inhaler Inhale 2 puffs into the lungs every 6 (six) hours as needed for wheezing or shortness of breath. 12/15/18   Orland Mustard, MD  ARIPiprazole (ABILIFY) 2 MG tablet Take 2 mg by mouth daily. 08/05/22   [provider]  ARIPiprazole (ABILIFY) 2 MG tablet Take 1 tablet by mouth daily. 10/14/22 01/12/23  [provider]  B Complex-C (SUPER B COMPLEX PO) Take by mouth.    [provider]  buPROPion (WELLBUTRIN XL) 300 MG 24 hr tablet Take 1 tablet by mouth daily. 04/21/22   [provider]  calcium carbonate (TUMS - DOSED  IN MG ELEMENTAL CALCIUM) 500 MG chewable tablet Chew 1 tablet by mouth as needed for indigestion or heartburn.    [provider]  Cholecalciferol 50 MCG (2000 UT) CAPS Take by mouth.    [provider]  DULoxetine (CYMBALTA) 60 MG capsule TAKE 2 CAPSULES BY MOUTH DAILY 11/05/22   Lovorn, Aundra Millet, MD  eletriptan (RELPAX) 40 MG tablet Take 1 tablet (40 mg total) by mouth as needed for migraine or headache. May repeat in 2 hours if headache persists or recurs.  Maximum 2 tablets in 24 hours. 01/19/23   Drema Dallas, DO  EPINEPHrine 0.3 mg/0.3 mL IJ SOAJ injection Inject into the muscle. 05/19/22   [provider]  fludrocortisone (FLORINEF) 0.1 MG tablet Take 1 tablet (0.1 mg total) by mouth daily. 01/08/23   Duke Salvia, MD  ipratropium (ATROVENT) 0.03 % nasal spray SMARTSIG:1 Spray(s) Both Nares 1-3 Times Daily PRN 10/15/22   [provider]  ivabradine (CORLANOR) 5 MG TABS tablet Take 1 tablet (5 mg total) by mouth 2 (two) times daily with a meal. 02/10/23   Duke Salvia, MD  ketoconazole (NIZORAL) 2 % shampoo Apply 1 Application topically 3 (three) times a week. 07/14/22   [provider]  Multiple Vitamin (MULTIVITAMIN) tablet Take 1 tablet by mouth daily.    [provider]  norethindrone-ethinyl estradiol (JUNEL 1/20) 1-20 MG-MCG tablet TAKE 1 TABLET BY MOUTH DAILY. TAKE CONTINUOUSLY, SKIP PLACEBO WEEK  11/21/22   Romualdo Bolk, MD  Omega-3 Fatty Acids (FISH OIL) 1200 MG CAPS Take by mouth.    [provider]  ondansetron (ZOFRAN-ODT) 8 MG disintegrating tablet Take 1 tablet (8 mg total) by mouth every 8 (eight) hours as needed for nausea or vomiting. 01/19/23   Everlena Cooper, Adam R, DO  propranolol ER (INDERAL LA) 120 MG 24 hr capsule Take 1 capsule (120 mg total) by mouth daily. 12/22/22   Graciella Freer, PA-C  tiZANidine (ZANAFLEX) 4 MG tablet Take 0.5-1 tablets (2-4 mg total) by mouth 2 (two) times daily as needed for muscle  spasms. 02/05/23   Lovorn, Aundra Millet, MD  ZEPBOUND 5 MG/0.5ML Pen Inject 5 mg into the skin once a week. 12/10/22   [provider]      Allergies    Gluten meal, Pecan extract, Strawberry extract, Beef allergy, Other, and Shellfish allergy    Review of Systems   Review of Systems  Gastrointestinal:  Positive for abdominal pain.    Physical Exam Updated Vital Signs BP 107/63 (BP Location: Right Arm)   Pulse (!) 118   Temp 99.2 F (37.3 C) (Oral)   Resp 18   Ht  (1.702 m)   Wt 65.8 kg   SpO2 99%   BMI 22.71 kg/m  Physical Exam Vitals and nursing note reviewed. Exam conducted with a chaperone present.  Constitutional:      General: She is not in acute distress.    Appearance: She is well-developed.  HENT:     Head: Normocephalic and atraumatic.  Eyes:     Conjunctiva/sclera: Conjunctivae normal.  Cardiovascular:     Rate and Rhythm: Normal rate and regular rhythm.     Heart sounds: No murmur heard. Pulmonary:     Effort: Pulmonary effort is normal. No respiratory distress.     Breath sounds: Normal breath sounds.  Abdominal:     Palpations: Abdomen is soft.     Tenderness: There is no abdominal tenderness.  Genitourinary:    General: Normal vulva.     Vagina: Vaginal discharge present.     Cervix: No cervical motion tenderness, friability or erythema.     Uterus: Normal. Not tender.      Adnexa: Right adnexa normal and left adnexa normal.       Right: No mass, tenderness or fullness.         Left: No mass, tenderness or fullness.    Musculoskeletal:        General: No swelling.     Cervical back: Neck supple.  Skin:    General: Skin is warm and dry.     Capillary Refill: Capillary refill takes less than 2 seconds.  Neurological:     Mental Status: She is alert.  Psychiatric:        Mood and Affect: Mood normal.     ED Results / Procedures / Treatments   Labs (all labs ordered are listed, but only abnormal results are displayed) Labs Reviewed   WET PREP, GENITAL - Abnormal; Notable for the following components:      Result Value   Clue Cells Wet Prep HPF POC PRESENT (*)    WBC, Wet Prep HPF POC >=10 (*)    All other components within normal limits  CBC WITH DIFFERENTIAL/PLATELET - Abnormal; Notable for the following components:   WBC 12.5 (*)    HCT 35.8 (*)    Neutro Abs 10.4 (*)    All other components within normal  limits  COMPREHENSIVE METABOLIC PANEL - Abnormal; Notable for the following components:   Sodium 133 (*)    CO2 20 (*)    Creatinine, Ser 1.12 (*)    Alkaline Phosphatase 36 (*)    Total Bilirubin 1.6 (*)    All other components within normal limits  URINALYSIS, ROUTINE W REFLEX MICROSCOPIC - Abnormal; Notable for the following components:   Ketones, ur 20 (*)    All other components within normal limits  LIPASE, BLOOD  PREGNANCY, URINE  GC/CHLAMYDIA PROBE AMP ([place]) NOT AT [facility]    EKG None  Radiology US PELVIC COMPLETE W TRANSVAGINAL AND TORSION R/O  Result Date: 02/12/2023 CLINICAL DATA:  [id number] Pelvic pain [id number] EXAM: ULTRASOUND OF PELVIS TECHNIQUE: Transabdominal and transvaginalultrasound examination of the pelvis was performed including evaluation of the uterus, ovaries, adnexal regions, and pelvic cul-de-sac. COMPARISON:  04/03/2022 FINDINGS: Uterusanteverted, 6 x 3 x 3 cm. The endometrium unremarkable, 3 mm. The uterine cavity is empty. There are no uterine masses. Right ovary Unremarkable, 1.6 x 1.3 x 1.1 cm. Left ovary Unremarkable, 1.8 x 1.4 x 1.3 cm. Images of the adnexae demonstrated no masses or fluid collections. Ovaries demonstrate blood flow with Doppler. IMPRESSION: Unremarkable examination of the pelvis. Electronically Signed   By: Layla Maw M.D.   On: 02/12/2023 16:22    Procedures Procedures    Medications Ordered in ED Medications  sodium chloride 0.9 % bolus 1,000 mL (0 mLs Intravenous Stopped 02/12/23 1645)  morphine (PF) 4 MG/ML injection 4 mg (4 mg Intravenous  Given 02/12/23 1515)  ondansetron (ZOFRAN) injection 4 mg (4 mg Intravenous Given 02/12/23 1515)  morphine (PF) 4 MG/ML injection 4 mg (4 mg Intravenous Given 02/12/23 1757)  metroNIDAZOLE (FLAGYL) tablet 500 mg (500 mg Oral Given 02/12/23 1944)    ED Course/ Medical Decision Making/ A&P Clinical Course as of 02/12/23 2100  Thu Feb 12, 2023  1939 Patient came in today with lower abdominal pain, seen at ER right outside of the system yesterday and had CT and pelvic ultrasound that were normal, diagnosed with UTI.  Continue having worsening pain today, feeling better after pain medication in the ED ultrasound repeated for the left pelvic pain, this was negative.  Labs are overall reassuring, she is afebrile, heart rate 109 on my reevaluation prior to discharge, patient states this is normal for her due to her diagnosis of POTS.  She is asking for nausea medicine for home as well.  Advised on strict return precautions and follow-up with PCP and/or gynecology [CB]    Clinical Course User Index [CB] Ma Rings, PA-C                             Medical Decision Making Ddx: Colitis, gastroenteritis, ovarian cyst, ovarian torsion, inflammatory disease, TOA, ureterolithiasis, UTI, other  ED course: Patient comes in for some pain, treated for UTI yesterday at another ED and had negative imaging at that time, pain is worsened today.  Patient is nontoxic in appearance, heart rate 109 on my evaluation prior to discharge which she states is her usual.  She got relief with some pain medicine, he repeated the pelvic ultrasound as we considered possible torsion detorsion.    The patient's abdomen is soft with no peritoneal signs, there is no right lower quadrant tenderness.  I do not suspect appendicitis based on my exam, I do not feel there is indication for repeat CT currently.  Risks of radiation are not outweighed by benefits at this time.    She does have BV onwet mount and did have moderate amount  whitish colored discharge so we will treat for this.  Given nausea and medicine in the ED good relief.  Labs are pending patient is not pregnant, UA shows 20 ketones otherwise negative, wet mount shows clue cells, lipase is normal, CMP shows slightly decreased sodium and CO2, CBC shows mild leukocytosis at 12.5.  No anemia.  Imaging: negative pelvic ultrasound  Amount and/or Complexity of Data Reviewed External Data Reviewed: labs and radiology.    Details: CT abdomen pelvis and pelvic ultrasound from [facility] yesterday reviewed Labs: ordered. Decision-making details documented in ED Course. Radiology: ordered. Decision-making details documented in ED Course.  Risk Prescription drug management.           Final Clinical Impression(s) / ED Diagnoses Final diagnoses:  Lower abdominal pain  Bacterial vaginitis    Rx / DC Orders ED Discharge Orders          Ordered    metroNIDAZOLE (FLAGYL) 500 MG tablet  2 times daily        02/12/23 1935    meloxicam (MOBIC) 7.5 MG tablet  Daily        02/12/23 1935    ondansetron (ZOFRAN-ODT) 4 MG disintegrating tablet  Every 6 hours PRN        02/12/23 1941              Josem Kaufmann 02/12/23 2109    Lorre Nick, MD 02/13/23 1149

## 2023-02-12 NOTE — ED Triage Notes (Signed)
Seen at [facility] yesterday and diagnosed with acute cystitis with hematuria. Pt states she was told if pain got worse, to come back to the ER. PCP sent pt after video call today.  LLQ pain that radiates into mid abdominal area, nausea, denies vomiting and diarrhea.

## 2023-02-13 LAB — GC/CHLAMYDIA PROBE AMP (~~LOC~~) NOT AT ARMC
Chlamydia: NEGATIVE
Comment: NEGATIVE
Comment: NORMAL
Neisseria Gonorrhea: NEGATIVE

## 2023-02-28 ENCOUNTER — Encounter: Payer: Self-pay | Admitting: Internal Medicine

## 2023-03-02 ENCOUNTER — Encounter: Payer: 59 | Attending: Physical Medicine and Rehabilitation | Admitting: Physical Medicine and Rehabilitation

## 2023-03-02 ENCOUNTER — Encounter: Payer: Self-pay | Admitting: Physical Medicine and Rehabilitation

## 2023-03-02 VITALS — BP 106/73 | HR 114 | Ht 67.0 in | Wt 140.2 lb

## 2023-03-02 DIAGNOSIS — M797 Fibromyalgia: Secondary | ICD-10-CM

## 2023-03-02 DIAGNOSIS — M7918 Myalgia, other site: Secondary | ICD-10-CM | POA: Diagnosis present

## 2023-03-02 DIAGNOSIS — Q796 Ehlers-Danlos syndrome, unspecified: Secondary | ICD-10-CM | POA: Diagnosis not present

## 2023-03-02 DIAGNOSIS — M5416 Radiculopathy, lumbar region: Secondary | ICD-10-CM | POA: Insufficient documentation

## 2023-03-02 DIAGNOSIS — M4317 Spondylolisthesis, lumbosacral region: Secondary | ICD-10-CM | POA: Insufficient documentation

## 2023-03-02 MED ORDER — DICLOFENAC POTASSIUM 50 MG PO TABS: 50.0000 mg | ORAL_TABLET | Freq: Three times a day (TID) | ORAL | 5 refills | Status: AC | PRN

## 2023-03-02 MED ORDER — PROMETHAZINE HCL 25 MG PO TABS: 25.0000 mg | ORAL_TABLET | Freq: Four times a day (QID) | ORAL | 5 refills | Status: AC | PRN

## 2023-03-02 NOTE — Progress Notes (Signed)
Subjective:    Patient ID: Kiara Brewer, female    DOB: 10/22/2001, 21 y.o.   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 Also has chronic pain syndrome- could also be due to fibromyalgia/Myofascial pain syndrome  Here for f/u on fibromyalgia/myofascial pain syndrome.      Also has pars defect and spondylolisthesis L5 on S1.    Thinking of getting more imaging of her back-  Back has been hurting a lot more and into hips.   Having overall body ain everywhere- at night- wrists, ankles, and face/jaw will hurt.   Also behind the ear bone pain.   Also hurting behind her knees- feels like she's overextending her knees and feels like "knee cap" shifting out of place. Still really painful.   Asked for referral for Rheumatology- Dr Dimple Casey- asked PCP.   Had appointment with Dr Graciela Husbands in March- already off new med that  PCP suggested- heart rate was out of control- Ivabradine- is the medicine pt stopped.  Back on propranolol and not really effective with her- and having hair fall out in clumps.    Doesn't want trigger point injections today.  They "stopped working for her".   Sometimes will take Advil when hurting- usually takes 600 mg when takes NSAIDs - sometimes helps and sometimes doesn't.  Still taking Duloxetine 120 mg daily- scheduled.  For 60 mg daily and 60 mg as needed- more lately- ~ 5 days/week.    Also taking Tizanidine- 2 nights/week- for muscle spasms- helps when she takes it.   Also put on something for Restless legs- that helps as well-    Also has pain from back that radiates down thighs to  mid thigh   Pain Inventory Average Pain 5 Pain Right Now 6 My pain is constant, sharp, dull, and aching  In the last 24 hours, has pain interfered with the following? General activity 6 Relation with others 4 Enjoyment of life 5 What TIME of day is your pain at its worst?  daytime, evening, night, and varies Sleep (in general) Poor  Pain is worse with: walking, bending, and standing Pain improves with: medication and bracing Relief from Meds: 5  Family History  Problem Relation Age of Onset   Arthritis Mother    Asthma Mother    Miscarriages / [place] Mother    Migraines Mother    COPD Father        per pt's mother-mild   Hypertension Father    Migraines Father    Asthma Brother    Migraines Brother    Anxiety disorder Brother    Arthritis Maternal Grandmother    Cancer Maternal Grandmother    Miscarriages / Stillbirths Maternal Grandmother    Diabetes Maternal Grandfather    Heart attack Maternal Grandfather    Heart disease Maternal Grandfather    Hyperlipidemia Maternal Grandfather    Hypertension Maternal Grandfather    Stroke Maternal Grandfather    Colon polyps Maternal Grandfather    Cancer Paternal Grandmother    Alcohol abuse Paternal Grandfather    Cancer Paternal Grandfather    COPD Paternal Grandfather    Heart attack Paternal Grandfather    Heart disease Paternal Grandfather    Hyperlipidemia Paternal Grandfather    Hypertension Paternal Grandfather    Asthma Brother    OCD Brother    Anxiety disorder Brother    Seizures Neg Hx  Depression Neg Hx    Bipolar disorder Neg Hx    Schizophrenia Neg Hx    ADD / ADHD Neg Hx    Autism Neg Hx    Colon cancer Neg Hx    Esophageal cancer Neg Hx    Rectal cancer Neg Hx    Stomach cancer Neg Hx    Social History   Socioeconomic History   Marital status: Married    Spouse name: Not on file   Number of children: Not on file   Years of education: Not on file   Highest education level: Not on file  Occupational History   Not on file  Tobacco Use   Smoking status: Never   Smokeless tobacco: Never  Vaping Use   Vaping Use: Never used  Substance and Sexual Activity   Alcohol use: Never   Drug use: Never   Sexual activity: Yes    Partners: Male    Birth  control/protection: Pill  Other Topics Concern   Not on file  Social History Narrative   [name] is in the 12th grade at [employer]; she does well academically. She lives with her parents. She enjoys going to the gym, boxing, and playing with animals   Right handed   Drinks caffeine   An apartment one story    Social Determinants of Health   Financial Resource Strain: Not on file  Food Insecurity: No Food Insecurity (08/09/2022)   Hunger Vital Sign    Worried About Running Out of Food in the Last Year: Never true    Ran Out of Food in the Last Year: Never true  Transportation Needs: No Transportation Needs (08/09/2022)   PRAPARE - Administrator, Civil Service (Medical): No    Lack of Transportation (Non-Medical): No  Physical Activity: Not on file  Stress: Not on file  Social Connections: Not on file   Past Surgical History:  Procedure Laterality Date   RECTAL EXAM UNDER ANESTHESIA N/A 11/22/2020   Procedure: ANAL  EXAM UNDER ANESTHESIA;  Surgeon: Romie Levee, MD;  Location: [facility];  Service: General;  Laterality: N/A;   SPHINCTEROTOMY N/A 11/22/2020   Procedure: CHEMICAL SPHINCTEROTOMY/ FISSURECTOMY;  Surgeon: Romie Levee, MD;  Location: [facility];  Service: General;  Laterality: N/A;   TONSILLECTOMY AND ADENOIDECTOMY  2010   WISDOM TOOTH EXTRACTION  2020   Past Surgical History:  Procedure Laterality Date   RECTAL EXAM UNDER ANESTHESIA N/A 11/22/2020   Procedure: ANAL  EXAM UNDER ANESTHESIA;  Surgeon: Romie Levee, MD;  Location: [facility];  Service: General;  Laterality: N/A;   SPHINCTEROTOMY N/A 11/22/2020   Procedure: CHEMICAL SPHINCTEROTOMY/ FISSURECTOMY;  Surgeon: Romie Levee, MD;  Location: [facility];  Service: General;  Laterality: N/A;   TONSILLECTOMY AND ADENOIDECTOMY  2010   WISDOM TOOTH EXTRACTION  2020   Past Medical History:  Diagnosis Date   Anal pain    Anxiety    Arthritis     Complex regional pain syndrome of both lower extremities    per pt w/ right thigh numbness   Depression    Ehlers-Danlos disease    Exercise-induced asthma    Fainting episodes    Fibromyalgia    GERD (gastroesophageal reflux disease)    History of concussion 04/2019   per pt no residual   History of Osgood-Schlatter disease    History of syncope    ED visit 08-29-2020 w/ cp, palpitations--- pt sent for cardiology evaluation ,  dr Lalla Brothers, office note 09-07-2020 dx orthostatic intolerence ;   echo in epic 09-14-2020 , normal;  event monitor 09-13-2020 no signfiance arrhythmia , SR/ST;  CT angio chest 09-02-2020  normal   Irritable bowel    Migraine without aura    Orthostatic hypotension    POTS (postural orthostatic tachycardia syndrome)    Sacral radiculopathy    Spondylolisthesis, lumbosacral region    L5--S1   Wears contact lenses    BP 106/73   Pulse (!) 114   Ht 5\' 7"  (1.702 m)   Wt 140 lb 3.2 oz (63.6 kg)   SpO2 99%   BMI 21.96 kg/m   Opioid Risk Score:   Fall Risk Score:  `1  Depression screen PHQ 2/9     03/02/2023    1:35 PM 10/28/2022    1:54 PM 10/21/2022    1:56 PM 08/19/2022    1:44 PM 02/21/2022    3:15 PM 11/15/2021    3:03 PM 04/03/2021   11:16 AM  Depression screen PHQ 2/9  Decreased Interest 2 0 1 1 1 1  0  Down, Depressed, Hopeless 2 0 1 1 1 1  0  PHQ - 2 Score 4 0 2 2 2 2  0     Review of Systems  Constitutional: Negative.   HENT: Negative.    Eyes: Negative.   Respiratory: Negative.    Cardiovascular:        POTS  Gastrointestinal: Negative.   Endocrine: Negative.   Genitourinary: Negative.   Musculoskeletal:  Positive for arthralgias, back pain and myalgias.  Skin: Negative.   Allergic/Immunologic: Negative.   Neurological:        POTS  Hematological: Negative.   Psychiatric/Behavioral:  Positive for dysphoric mood.   All other systems reviewed and are negative.      Objective:   Physical Exam  Pt has hypermobility noted on exam  in knees, hands, wrists, and elbows; as well as hypermobile in shoulders and less so in hips- of note, more hyperextension of R knee than l knee- with mild Baker's cyst noted on R and Some effusion noted on L knee anteriorly.  TTP across mid sacral midline- but not really TTP over l5/S1 area Notable lordosis of low back       Assessment & Plan:   Patient is an 22 yr old female with hx of EDS and  chronic pain syndrome- could also be due to fibromyalgia/Myofascial pain syndrome  Here for f/u on fibromyalgia/myofascial pain syndrome.      Having more low back/sacral pain than before and generalized joint pain.   Taking Mirapex/Pramipexole 5 mg nightly for Restless leg syndrome- helpful- not in computer here. 2-3 months.  2. Will try  Diclofenac 50 mg 3x/day as needed for joint pain-   3. Con't Duloxetine- 120 mg daily for nerve pain/fibromyalgia- has refills   4. Will start Phenergan- Promethazine you can 1/2 to 1  tabs as needed up for Q6 hours #60 with 5 refills Usually find it to be more effective for nausea with migraines.   5. Con't Tizanidine- 2-5 mg as needed for muscle spasms. Can take up to 2x/day asn needed for muscle spasms.    6. Suggest looking into clinics that specialize in Ehler's Danlos syndrome. There is one in [place].    7.  Looking into changing Propranolol via Cardiology.   8. Lumbar MRI - per d/w Sports Medicine physician- du to pars defect at L5/S1 and lower sacral pain and sx's of radiculopathy-  she does Home exercise program and if anything, pain has been getting worse, not better since last MRI 2021.    9. F/U in 3 months-     I spent a total of  35  minutes on total care today- >50% coordination of care- due to  D/w pt about lumbar MRI and d/w pt and husband about interventions as detailed above.

## 2023-03-02 NOTE — Patient Instructions (Signed)
Patient is an 22 yr old female with hx of EDS and  chronic pain syndrome- could also be due to fibromyalgia/Myofascial pain syndrome  Here for f/u on fibromyalgia/myofascial pain syndrome.      Having more low back/sacral pain than before and generalized joint pain.   Taking Mirapex/Pramipexole 5 mg nightly for Restless leg syndrome- helpful- not in computer here. 2-3 months.  2. Will try  Diclofenac 50 mg 3x/day as needed for joint pain-   3. Con't Duloxetine- 120 mg daily for nerve pain/fibromyalgia- has refills   4. Will start Phenergan- Promethazine you can 1/2 to 1  tabs as needed up for Q6 hours #60 with 5 refills Usually find it to be more effective for nausea with migraines.   5. Con't Tizanidine- 2-5 mg as needed for muscle spasms. Can take up to 2x/day asn needed for muscle spasms.    6. Suggest looking into clinics that specialize in Ehler's Danlos syndrome. There is one in [place].    7.  Looking into changing Propranolol via Cardiology.   8. Lumbar MRI - per d/w Sports Medicine physician- du to pars defect at L5/S1 and lower sacral pain and sx's of radiculopathy- she does Home exercise program and if anything, pain has been getting worse, not better since last MRI 2021.    9. F/U in 3 months-

## 2023-03-02 NOTE — Telephone Encounter (Signed)
This would normally be the wrong dose of the Eliquis for you.  With your body weight and your renal function the right dose is 5 mg twice daily.  If however, the ENT doctor thinks that you will bleed less, then we are making a trade off between your nose and some difficult to measure but increased risk of ineffectiveness of blood thinner.Hope this helps SK

## 2023-03-03 NOTE — Progress Notes (Deleted)
[facility] [address] [place] [address] Phone: [phone] Subjective:    I'm seeing this patient by the request  of:  Cox, Burman Foster, NP  CC: Low back pain follow-up  [id number]  01/13/2023 Concerned that patient does have more of a spondylolisthesis noted.  Patient states that she has been diagnosed with the hypermobility and potentially her longstanding low syndrome.  Is extremely concerned with what we find at this point but does have underlying fascial pain that does go with even the complex regional pain syndrome.  We discussed with patient that I do feel that if we continue to have pain I like to consider the possibility of advanced imaging of the low back again.  Patient does have the pars defect with the anterior listhesis of L5 on S1 and if there is any nerve impingement could be a candidate for possible epidurals.  We discussed that we could consider different medications including gabapentin for breakthrough.  Is doing very well with the Cymbalta at the moment.  Patient would like to avoid the osteopathic manipulation at this point.  Given more of isometrics and strengthening.  Follow-up with me again in 2 months.   Update 03/16/2023 Valene Loubier is a 22 y.o. female coming in with complaint of lumbar spine pain.  Patient has significant hypermobility as well as spondylolisthesis at L5 on S1.  Follow-up with Cymbalta and manipulation.  Patient states      Past Medical History:  Diagnosis Date   Anal pain    Anxiety    Arthritis    Complex regional pain syndrome of both lower extremities    per pt w/ right thigh numbness   Depression    Ehlers-Danlos disease    Exercise-induced asthma    Fainting episodes    Fibromyalgia    GERD (gastroesophageal reflux disease)    History of concussion 04/2019   per pt no residual   History of Osgood-Schlatter disease    History of syncope    ED visit 08-29-2020 w/ cp, palpitations--- pt sent  for cardiology evaluation , dr Lalla Brothers, office note 09-07-2020 dx orthostatic intolerence ;   echo in epic 09-14-2020 , normal;  event monitor 09-13-2020 no signfiance arrhythmia , SR/ST;  CT angio chest 09-02-2020  normal   Irritable bowel    Migraine without aura    Orthostatic hypotension    POTS (postural orthostatic tachycardia syndrome)    Sacral radiculopathy    Spondylolisthesis, lumbosacral region    L5--S1   Wears contact lenses    Past Surgical History:  Procedure Laterality Date   RECTAL EXAM UNDER ANESTHESIA N/A 11/22/2020   Procedure: ANAL  EXAM UNDER ANESTHESIA;  Surgeon: Romie Levee, MD;  Location: [facility];  Service: General;  Laterality: N/A;   SPHINCTEROTOMY N/A 11/22/2020   Procedure: CHEMICAL SPHINCTEROTOMY/ FISSURECTOMY;  Surgeon: Romie Levee, MD;  Location: [facility] [place];  Service: General;  Laterality: N/A;   TONSILLECTOMY AND ADENOIDECTOMY  2010   WISDOM TOOTH EXTRACTION  2020   Social History   Socioeconomic History   Marital status: Married    Spouse name: Not on file   Number of children: Not on file   Years of education: Not on file   Highest education level: Not on file  Occupational History   Not on file  Tobacco Use   Smoking status: Never   Smokeless tobacco: Never  Vaping Use   Vaping Use: Never used  Substance and  Sexual Activity   Alcohol use: Never   Drug use: Never   Sexual activity: Yes    Partners: Male    Birth control/protection: Pill  Other Topics Concern   Not on file  Social History Narrative   [name] is in the 12th grade at [employer]; she does well academically. She lives with her parents. She enjoys going to the gym, boxing, and playing with animals   Right handed   Drinks caffeine   An apartment one story    Social Determinants of Health   Financial Resource Strain: Not on file  Food Insecurity: No Food Insecurity (08/09/2022)   Hunger Vital Sign    Worried About Running Out of Food in  the Last Year: Never true    Ran Out of Food in the Last Year: Never true  Transportation Needs: No Transportation Needs (08/09/2022)   PRAPARE - Administrator, Civil Service (Medical): No    Lack of Transportation (Non-Medical): No  Physical Activity: Not on file  Stress: Not on file  Social Connections: Not on file   Allergies  Allergen Reactions   Gluten Meal     Other reaction(s): GI intolerance Vomiting   Pecan Extract Anaphylaxis and Hives   Strawberry Extract Anaphylaxis and Hives   Beef Allergy Hives    Other reaction(s): GI intolerance RED MEAT   Other Hives    Red Meat  and chicken   Shellfish Allergy Hives    lobster Other reaction(s): Other lobster lobster  lobster Other reaction(s): Other lobster   Family History  Problem Relation Age of Onset   Arthritis Mother    Asthma Mother    Miscarriages / Stillbirths Mother    Migraines Mother    COPD Father        per pt's mother-mild   Hypertension Father    Migraines Father    Asthma Brother    Migraines Brother    Anxiety disorder Brother    Arthritis Maternal Grandmother    Cancer Maternal Grandmother    Miscarriages / Stillbirths Maternal Grandmother    Diabetes Maternal Grandfather    Heart attack Maternal Grandfather    Heart disease Maternal Grandfather    Hyperlipidemia Maternal Grandfather    Hypertension Maternal Grandfather    Stroke Maternal Grandfather    Colon polyps Maternal Grandfather    Cancer Paternal Grandmother    Alcohol abuse Paternal Grandfather    Cancer Paternal Grandfather    COPD Paternal Grandfather    Heart attack Paternal Grandfather    Heart disease Paternal Grandfather    Hyperlipidemia Paternal Grandfather    Hypertension Paternal Grandfather    Asthma Brother    OCD Brother    Anxiety disorder Brother    Seizures Neg Hx    Depression Neg Hx    Bipolar disorder Neg Hx    Schizophrenia Neg Hx    ADD / ADHD Neg Hx    Autism Neg Hx    Colon cancer  Neg Hx    Esophageal cancer Neg Hx    Rectal cancer Neg Hx    Stomach cancer Neg Hx     Current Outpatient Medications (Endocrine & Metabolic):    fludrocortisone (FLORINEF) 0.1 MG tablet, Take 1 tablet (0.1 mg total) by mouth daily.   norethindrone-ethinyl estradiol (JUNEL 1/20) 1-20 MG-MCG tablet, TAKE 1 TABLET BY MOUTH DAILY. TAKE CONTINUOUSLY, SKIP PLACEBO WEEK   Current Outpatient Medications (Cardiovascular):    EPINEPHrine 0.3 mg/0.3 mL IJ SOAJ injection, Inject  into the muscle.   ivabradine (CORLANOR) 5 MG TABS tablet, Take 1 tablet (5 mg total) by mouth 2 (two) times daily with a meal.   propranolol ER (INDERAL LA) 120 MG 24 hr capsule, Take 1 capsule (120 mg total) by mouth daily.   Current Outpatient Medications (Respiratory):    albuterol (PROVENTIL HFA;VENTOLIN HFA) 108 (90 Base) MCG/ACT inhaler, Inhale 2 puffs into the lungs every 6 (six) hours as needed for wheezing or shortness of breath.   ipratropium (ATROVENT) 0.03 % nasal spray, SMARTSIG:1 Spray(s) Both Nares 1-3 Times Daily PRN   promethazine (PHENERGAN) 25 MG tablet, Take 1 tablet (25 mg total) by mouth every 6 (six) hours as needed for nausea or vomiting.   Current Outpatient Medications (Analgesics):    diclofenac (CATAFLAM) 50 MG tablet, Take 1 tablet (50 mg total) by mouth 3 (three) times daily as needed.   eletriptan (RELPAX) 40 MG tablet, Take 1 tablet (40 mg total) by mouth as needed for migraine or headache. May repeat in 2 hours if headache persists or recurs.  Maximum 2 tablets in 24 hours.   meloxicam (MOBIC) 7.5 MG tablet, Take 1 tablet (7.5 mg total) by mouth daily.     Current Outpatient Medications (Other):    ARIPiprazole (ABILIFY) 2 MG tablet, Take 2 mg by mouth daily.   ARIPiprazole (ABILIFY) 2 MG tablet, Take 1 tablet by mouth daily.   B Complex-C (SUPER B COMPLEX PO), Take by mouth.   buPROPion (WELLBUTRIN XL) 300 MG 24 hr tablet, Take 1 tablet by mouth daily.   calcium carbonate (TUMS -  DOSED IN MG ELEMENTAL CALCIUM) 500 MG chewable tablet, Chew 1 tablet by mouth as needed for indigestion or heartburn.   Cholecalciferol 50 MCG (2000 UT) CAPS, Take by mouth.   DULoxetine (CYMBALTA) 60 MG capsule, TAKE 2 CAPSULES BY MOUTH DAILY   ketoconazole (NIZORAL) 2 % shampoo, Apply 1 Application topically 3 (three) times a week.   metroNIDAZOLE (FLAGYL) 500 MG tablet, Take 1 tablet (500 mg total) by mouth 2 (two) times daily.   Multiple Vitamin (MULTIVITAMIN) tablet, Take 1 tablet by mouth daily.   Omega-3 Fatty Acids (FISH OIL) 1200 MG CAPS, Take by mouth.   ondansetron (ZOFRAN-ODT) 4 MG disintegrating tablet, Take 1 tablet (4 mg total) by mouth every 6 (six) hours as needed for nausea or vomiting.   ondansetron (ZOFRAN-ODT) 8 MG disintegrating tablet, Take 1 tablet (8 mg total) by mouth every 8 (eight) hours as needed for nausea or vomiting.   tiZANidine (ZANAFLEX) 4 MG tablet, Take 0.5-1 tablets (2-4 mg total) by mouth 2 (two) times daily as needed for muscle spasms.   ZEPBOUND 5 MG/0.5ML Pen, Inject 5 mg into the skin once a week.  Current Facility-Administered Medications (Other):    0.9 %  sodium chloride infusion   Reviewed prior external information including notes and imaging from  primary care provider As well as notes that were available from care everywhere and other healthcare systems.  Past medical history, social, surgical and family history all reviewed in electronic medical record.  No pertanent information unless stated regarding to the chief complaint.   Review of Systems:  No headache, visual changes, nausea, vomiting, diarrhea, constipation, dizziness, abdominal pain, skin rash, fevers, chills, night sweats, weight loss, swollen lymph nodes, body aches, joint swelling, chest pain, shortness of breath, mood changes. POSITIVE muscle aches  Objective  There were no vitals taken for this visit.   General: No apparent distress alert and oriented  x3 mood and affect  normal, dressed appropriately.  HEENT: Pupils equal, extraocular movements intact  Respiratory: Patient's speak in full sentences and does not appear short of breath  Cardiovascular: No lower extremity edema, non tender, no erythema     Osteopathic findings Cervical C2 flexed rotated and side bent right C4 flexed rotated and side bent left C6 flexed rotated and side bent left T3 extended rotated and side bent right inhaled third rib T9 extended rotated and side bent left L2 flexed rotated and side bent right Sacrum right on right  Impression and Recommendations:     The above documentation has been reviewed and is accurate and complete Judi Saa, DO

## 2023-03-09 NOTE — Telephone Encounter (Signed)
Previous note below entered by Dr Graciela Husbands was entered in error.

## 2023-03-16 ENCOUNTER — Ambulatory Visit: Payer: 59 | Admitting: Family Medicine

## 2023-04-01 ENCOUNTER — Encounter: Payer: Self-pay | Admitting: Internal Medicine

## 2023-04-01 ENCOUNTER — Encounter: Payer: Self-pay | Admitting: Physical Medicine and Rehabilitation

## 2023-04-01 ENCOUNTER — Ambulatory Visit
Admission: RE | Admit: 2023-04-01 | Discharge: 2023-04-01 | Disposition: A | Discharge status: Home / Self Care | Payer: 59 | Source: Ambulatory Visit | Attending: Physical Medicine and Rehabilitation | Admitting: Physical Medicine and Rehabilitation

## 2023-04-01 DIAGNOSIS — M5416 Radiculopathy, lumbar region: Secondary | ICD-10-CM

## 2023-04-01 DIAGNOSIS — M4317 Spondylolisthesis, lumbosacral region: Secondary | ICD-10-CM

## 2023-04-01 MED ORDER — BISOPROLOL FUMARATE 5 MG PO TABS: 2.5000 mg | ORAL_TABLET | Freq: Every evening | ORAL | 1 refills | Status: DC

## 2023-04-01 NOTE — Addendum Note (Signed)
Addended by: Alois Cliche on: 04/01/2023 01:44 PM   Modules accepted: Orders

## 2023-04-01 NOTE — Addendum Note (Signed)
Addended by: Alois Cliche on: 04/01/2023 05:37 PM   Modules accepted: Orders

## 2023-04-01 NOTE — Addendum Note (Signed)
Addended by: Alois Cliche on: 04/01/2023 05:48 PM   Modules accepted: Orders

## 2023-04-07 NOTE — Progress Notes (Unsigned)
Kiara Brewer [address] [place] [address] Phone: [phone] Subjective:   Kiara Brewer, am serving as a scribe for Dr. Antoine Primas.  I'm seeing this patient by the request  of:  Cox, Burman Foster, NP  CC: Low back pain follow-up  [id number]  01/13/2023 Concerned that patient does have more of a spondylolisthesis noted.  Patient states that she has been diagnosed with the hypermobility and potentially her longstanding low syndrome.  Is extremely concerned with what we find at this point but does have underlying fascial pain that does go with even the complex regional pain syndrome.  We discussed with patient that I do feel that if we continue to have pain I like to consider the possibility of advanced imaging of the low back again.  Patient does have the pars defect with the anterior listhesis of L5 on S1 and if there is any nerve impingement could be a candidate for possible epidurals.  We discussed that we could consider different medications including gabapentin for breakthrough.  Is doing very well with the Cymbalta at the moment.  Patient would like to avoid the osteopathic manipulation at this point.  Given more of isometrics and strengthening.  Follow-up with me again in 2 months.   Updated 04/08/2023 Kiara Brewer is a 22 y.o. female coming in with complaint of back pain. Patient feels that she is having minimal improvement since last visit.    Also notes that both knees are hyperextending more often. Pain in back of knees when this occurs.       Past Medical History:  Diagnosis Date   Anal pain    Anxiety    Arthritis    Complex regional pain syndrome of both lower extremities    per pt w/ right thigh numbness   Depression    Ehlers-Danlos disease    Exercise-induced asthma    Fainting episodes    Fibromyalgia    GERD (gastroesophageal reflux disease)    History of concussion 04/2019   per pt no residual   History of  Osgood-Schlatter disease    History of syncope    ED visit 08-29-2020 w/ cp, palpitations--- pt sent for cardiology evaluation , dr Lalla Brothers, office note 09-07-2020 dx orthostatic intolerence ;   echo in epic 09-14-2020 , normal;  event monitor 09-13-2020 no signfiance arrhythmia , SR/ST;  CT angio chest 09-02-2020  normal   Irritable bowel    Migraine without aura    Orthostatic hypotension    POTS (postural orthostatic tachycardia syndrome)    Sacral radiculopathy    Spondylolisthesis, lumbosacral region    L5--S1   Wears contact lenses    Past Surgical History:  Procedure Laterality Date   RECTAL EXAM UNDER ANESTHESIA N/A 11/22/2020   Procedure: ANAL  EXAM UNDER ANESTHESIA;  Surgeon: Romie Levee, MD;  Location: [facility] [place];  Service: General;  Laterality: N/A;   SPHINCTEROTOMY N/A 11/22/2020   Procedure: CHEMICAL SPHINCTEROTOMY/ FISSURECTOMY;  Surgeon: Romie Levee, MD;  Location: [facility];  Service: General;  Laterality: N/A;   TONSILLECTOMY AND ADENOIDECTOMY  2010   WISDOM TOOTH EXTRACTION  2020   Social History   Socioeconomic History   Marital status: Married    Spouse name: Not on file   Number of children: Not on file   Years of education: Not on file   Highest education level: Not on file  Occupational History   Not on file  Tobacco Use  Smoking status: Never   Smokeless tobacco: Never  Vaping Use   Vaping Use: Never used  Substance and Sexual Activity   Alcohol use: Never   Drug use: Never   Sexual activity: Yes    Partners: Male    Birth control/protection: Pill  Other Topics Concern   Not on file  Social History Narrative   [name] is in the 12th grade at [employer]; she does well academically. She lives with her parents. She enjoys going to the gym, boxing, and playing with animals   Right handed   Drinks caffeine   An apartment one story    Social Determinants of Health   Financial Resource Strain: Not on file  Food  Insecurity: No Food Insecurity (08/09/2022)   Hunger Vital Sign    Worried About Running Out of Food in the Last Year: Never true    Ran Out of Food in the Last Year: Never true  Transportation Needs: No Transportation Needs (08/09/2022)   PRAPARE - Administrator, Civil Service (Medical): No    Lack of Transportation (Non-Medical): No  Physical Activity: Not on file  Stress: Not on file  Social Connections: Not on file   Allergies  Allergen Reactions   Gluten Meal     Other reaction(s): GI intolerance Vomiting   Pecan Extract Anaphylaxis and Hives   Strawberry Extract Anaphylaxis and Hives   Beef Allergy Hives    Other reaction(s): GI intolerance RED MEAT   Other Hives    Red Meat  and chicken   Shellfish Allergy Hives    lobster Other reaction(s): Other lobster lobster  lobster Other reaction(s): Other lobster   Family History  Problem Relation Age of Onset   Arthritis Mother    Asthma Mother    Miscarriages / Stillbirths Mother    Migraines Mother    COPD Father        per pt's mother-mild   Hypertension Father    Migraines Father    Asthma Brother    Migraines Brother    Anxiety disorder Brother    Arthritis Maternal Grandmother    Cancer Maternal Grandmother    Miscarriages / Stillbirths Maternal Grandmother    Diabetes Maternal Grandfather    Heart attack Maternal Grandfather    Heart disease Maternal Grandfather    Hyperlipidemia Maternal Grandfather    Hypertension Maternal Grandfather    Stroke Maternal Grandfather    Colon polyps Maternal Grandfather    Cancer Paternal Grandmother    Alcohol abuse Paternal Grandfather    Cancer Paternal Grandfather    COPD Paternal Grandfather    Heart attack Paternal Grandfather    Heart disease Paternal Grandfather    Hyperlipidemia Paternal Grandfather    Hypertension Paternal Grandfather    Asthma Brother    OCD Brother    Anxiety disorder Brother    Seizures Neg Hx    Depression Neg Hx     Bipolar disorder Neg Hx    Schizophrenia Neg Hx    ADD / ADHD Neg Hx    Autism Neg Hx    Colon cancer Neg Hx    Esophageal cancer Neg Hx    Rectal cancer Neg Hx    Stomach cancer Neg Hx     Current Outpatient Medications (Endocrine & Metabolic):    fludrocortisone (FLORINEF) 0.1 MG tablet, Take 1 tablet (0.1 mg total) by mouth daily.   norethindrone-ethinyl estradiol (JUNEL 1/20) 1-20 MG-MCG tablet, TAKE 1 TABLET BY MOUTH DAILY. TAKE CONTINUOUSLY,  SKIP PLACEBO WEEK   Current Outpatient Medications (Cardiovascular):    bisoprolol (ZEBETA) 5 MG tablet, Take 0.5 tablets (2.5 mg total) by mouth at bedtime.   EPINEPHrine 0.3 mg/0.3 mL IJ SOAJ injection, Inject into the muscle.   Current Outpatient Medications (Respiratory):    albuterol (PROVENTIL HFA;VENTOLIN HFA) 108 (90 Base) MCG/ACT inhaler, Inhale 2 puffs into the lungs every 6 (six) hours as needed for wheezing or shortness of breath.   ipratropium (ATROVENT) 0.03 % nasal spray, SMARTSIG:1 Spray(s) Both Nares 1-3 Times Daily PRN   promethazine (PHENERGAN) 25 MG tablet, Take 1 tablet (25 mg total) by mouth every 6 (six) hours as needed for nausea or vomiting.   Current Outpatient Medications (Analgesics):    diclofenac (CATAFLAM) 50 MG tablet, Take 1 tablet (50 mg total) by mouth 3 (three) times daily as needed.   eletriptan (RELPAX) 40 MG tablet, Take 1 tablet (40 mg total) by mouth as needed for migraine or headache. May repeat in 2 hours if headache persists or recurs.  Maximum 2 tablets in 24 hours.   meloxicam (MOBIC) 7.5 MG tablet, Take 1 tablet (7.5 mg total) by mouth daily.     Current Outpatient Medications (Other):    ARIPiprazole (ABILIFY) 2 MG tablet, Take 2 mg by mouth daily.   B Complex-C (SUPER B COMPLEX PO), Take by mouth.   buPROPion (WELLBUTRIN XL) 300 MG 24 hr tablet, Take 1 tablet by mouth daily.   calcium carbonate (TUMS - DOSED IN MG ELEMENTAL CALCIUM) 500 MG chewable tablet, Chew 1 tablet by mouth as  needed for indigestion or heartburn.   Cholecalciferol 50 MCG (2000 UT) CAPS, Take by mouth.   DULoxetine (CYMBALTA) 60 MG capsule, TAKE 2 CAPSULES BY MOUTH DAILY   ketoconazole (NIZORAL) 2 % shampoo, Apply 1 Application topically 3 (three) times a week.   metroNIDAZOLE (FLAGYL) 500 MG tablet, Take 1 tablet (500 mg total) by mouth 2 (two) times daily.   Multiple Vitamin (MULTIVITAMIN) tablet, Take 1 tablet by mouth daily.   Omega-3 Fatty Acids (FISH OIL) 1200 MG CAPS, Take by mouth.   ondansetron (ZOFRAN-ODT) 4 MG disintegrating tablet, Take 1 tablet (4 mg total) by mouth every 6 (six) hours as needed for nausea or vomiting.   ondansetron (ZOFRAN-ODT) 8 MG disintegrating tablet, Take 1 tablet (8 mg total) by mouth every 8 (eight) hours as needed for nausea or vomiting.   tiZANidine (ZANAFLEX) 4 MG tablet, Take 0.5-1 tablets (2-4 mg total) by mouth 2 (two) times daily as needed for muscle spasms.   ZEPBOUND 5 MG/0.5ML Pen, Inject 5 mg into the skin once a week.   ARIPiprazole (ABILIFY) 2 MG tablet, Take 1 tablet by mouth daily.  Current Facility-Administered Medications (Other):    0.9 %  sodium chloride infusion   Reviewed prior external information including notes and imaging from  primary care provider As well as notes that were available from care everywhere and other healthcare systems.  Past medical history, social, surgical and family history all reviewed in electronic medical record.  No pertanent information unless stated regarding to the chief complaint.   Review of Systems:  No headache, visual changes, nausea, vomiting, diarrhea, constipation, dizziness, abdominal pain, skin rash, fevers, chills, night sweats, weight loss, swollen lymph nodes, body aches, joint swelling, chest pain, shortness of breath, mood changes. POSITIVE muscle aches  Objective  Blood pressure 100/72, pulse 99, height 5\' 7"  (1.702 m), weight 134 lb (60.8 kg), SpO2 99 %.   General: No apparent  distress  alert and oriented x3 mood and affect normal, dressed appropriately.  HEENT: Pupils equal, extraocular movements intact  Respiratory: Patient's speak in full sentences and does not appear short of breath  Cardiovascular: No lower extremity edema, non tender, no erythema  Hypermobility still noted to multiple joints.  Patient does have tenderness to palpation in the paraspinal musculature of the lumbar spine.  Worsening pain with extension of the back noted. Tightness with straight leg testing bilaterally.   Impression and Recommendations:    The above documentation has been reviewed and is accurate and complete Judi Saa, DO

## 2023-04-08 ENCOUNTER — Encounter: Payer: Self-pay | Admitting: Obstetrics and Gynecology

## 2023-04-08 ENCOUNTER — Ambulatory Visit (INDEPENDENT_AMBULATORY_CARE_PROVIDER_SITE_OTHER): Payer: 59 | Admitting: Obstetrics and Gynecology

## 2023-04-08 ENCOUNTER — Ambulatory Visit (INDEPENDENT_AMBULATORY_CARE_PROVIDER_SITE_OTHER): Payer: 59 | Admitting: Family Medicine

## 2023-04-08 VITALS — BP 110/62 | Temp 97.8°F | Wt 133.0 lb

## 2023-04-08 VITALS — BP 100/72 | HR 99 | Ht 67.0 in | Wt 134.0 lb

## 2023-04-08 DIAGNOSIS — N898 Other specified noninflammatory disorders of vagina: Secondary | ICD-10-CM

## 2023-04-08 DIAGNOSIS — S3141XA Laceration without foreign body of vagina and vulva, initial encounter: Secondary | ICD-10-CM | POA: Diagnosis not present

## 2023-04-08 DIAGNOSIS — M4317 Spondylolisthesis, lumbosacral region: Secondary | ICD-10-CM

## 2023-04-08 DIAGNOSIS — N941 Unspecified dyspareunia: Secondary | ICD-10-CM | POA: Diagnosis not present

## 2023-04-08 DIAGNOSIS — R300 Dysuria: Secondary | ICD-10-CM

## 2023-04-08 DIAGNOSIS — M5416 Radiculopathy, lumbar region: Secondary | ICD-10-CM

## 2023-04-08 LAB — WET PREP FOR TRICH, YEAST, CLUE

## 2023-04-08 LAB — URINALYSIS, COMPLETE
Glucose, UA: NEGATIVE
Hgb urine dipstick: NEGATIVE
Nitrite: NEGATIVE
Specific Gravity, Urine: 1.025 (ref 1.001–1.035)
pH: 5.5 (ref 5.0–8.0)

## 2023-04-08 NOTE — Assessment & Plan Note (Addendum)
Patient has had the MRI showing a spondylolisthesis and potential nerve root impingement. Is having what she believes is weakness in the lower extremities.  We discussed with patient's medications do not feel comfortable adding any other significant medications that we can help with.  Can consider the possibility of osteopathic manipulation and the patient did have a significant amount of voluntary guarding noted today.  Patient does have the mild radicular symptoms we will continue to monitor we will get the epidural and see how patient responds.  Follow-up with me 6 weeks after the injection.

## 2023-04-08 NOTE — Progress Notes (Signed)
GYNECOLOGY  VISIT   HPI: 22 y.o.   Married White or Caucasian Not Hispanic or Latino  female   [id number] with No LMP recorded. (Menstrual status: Oral contraceptives).   here for  recurrent UTI's she was seen yesterday with UTI symptoms and started on antibiotics. Culture is pending. She states that she is getting UTI symptoms frequently and would like to discuss the cause of this. She doesn't feel it is related to intercourse.  Chart reviewed She had a documented enterococcus UTI in 4/24. Yesterday she was treated for a presumed UTI, culture pending.   She c/o a 2-3 month h/o entry dyspareunia, feels like she is tearing. Once they start having sex it feels better. Can have burning and spotting after sex.  Uses a lubricant. She is married, same long term partner, prior to the last few months this wasn't an issue.  No itching, burning, irritation or d/c.   GYNECOLOGIC HISTORY: No LMP recorded. (Menstrual status: Oral contraceptives). Contraception:OCP Menopausal hormone therapy: none         OB History     Gravida  0   Para  0   Term  0   Preterm  0   AB  0   Living  0      SAB  0   IAB  0   Ectopic  0   Multiple  0   Live Births  0              Patient Active Problem List   Diagnosis Date Noted   EDS (Ehlers-Danlos syndrome) 03/02/2023   Lumbar radiculopathy 03/02/2023   Hematochezia    GI bleed 08/09/2022   Abdominal pain 08/09/2022   Lactic acidosis 08/09/2022   GIB (gastrointestinal bleeding) 08/09/2022   Anal fissure, unspecified 08/04/2022   Chronic fatigue syndrome 08/04/2022   Constipation 08/04/2022   Diarrhea 08/04/2022   Dyspepsia 08/04/2022   Gastroesophageal reflux disease 08/04/2022   Gastroparesis 08/04/2022   Nausea and vomiting 08/04/2022   Orthostatic hypotension 08/04/2022   Restless leg syndrome 08/04/2022   Scoliosis 08/04/2022   Sleep disorder, unspecified 08/04/2022   Tachycardia 08/04/2022   Tinnitus 08/04/2022    Unspecified astigmatism, bilateral 08/04/2022   Unspecified dyspareunia 08/04/2022   Benign joint hypermobility 11/15/2021   Dysautonomia (HCC) 06/03/2021   Orthostatic intolerance 06/03/2021   Myofascial pain 10/08/2020   Depression, recurrent (HCC) 09/11/2020   Syncope 09/07/2020   Fibromyalgia    Migraine without aura    Vasovagal syncope 11/23/2019   Complex regional pain syndrome i of lower limb, bilateral 01/11/2019   Right leg numbness 08/31/2018   New onset headache 08/31/2018   Exercise-induced asthma 08/13/2018   Osgood-Schlatter's disease 08/13/2018   Complex regional pain syndrome of lower extremity 08/13/2018   Sacral radiculopathy 08/02/2018   Spondylolisthesis at L5-S1 level 08/02/2018    Past Medical History:  Diagnosis Date   Anal pain    Anxiety    Arthritis    Complex regional pain syndrome of both lower extremities    per pt w/ right thigh numbness   Depression    Ehlers-Danlos disease    Exercise-induced asthma    Fainting episodes    Fibromyalgia    GERD (gastroesophageal reflux disease)    History of concussion 04/2019   per pt no residual   History of Osgood-Schlatter disease    History of syncope    ED visit 08-29-2020 w/ cp, palpitations--- pt sent for cardiology evaluation , dr Lalla Brothers, office note 09-07-2020  dx orthostatic intolerence ;   echo in epic 09-14-2020 , normal;  event monitor 09-13-2020 no signfiance arrhythmia , SR/ST;  CT angio chest 09-02-2020  normal   Irritable bowel    Migraine without aura    Orthostatic hypotension    POTS (postural orthostatic tachycardia syndrome)    Sacral radiculopathy    Spondylolisthesis, lumbosacral region    L5--S1   Wears contact lenses     Past Surgical History:  Procedure Laterality Date   RECTAL EXAM UNDER ANESTHESIA N/A 11/22/2020   Procedure: ANAL  EXAM UNDER ANESTHESIA;  Surgeon: Romie Levee, MD;  Location: [facility];  Service: General;  Laterality: N/A;    SPHINCTEROTOMY N/A 11/22/2020   Procedure: CHEMICAL SPHINCTEROTOMY/ FISSURECTOMY;  Surgeon: Romie Levee, MD;  Location: [facility];  Service: General;  Laterality: N/A;   TONSILLECTOMY AND ADENOIDECTOMY  2010   WISDOM TOOTH EXTRACTION  2020    Current Outpatient Medications  Medication Sig Dispense Refill   albuterol (PROVENTIL HFA;VENTOLIN HFA) 108 (90 Base) MCG/ACT inhaler Inhale 2 puffs into the lungs every 6 (six) hours as needed for wheezing or shortness of breath. 1 Inhaler 3   B Complex-C (SUPER B COMPLEX PO) Take by mouth.     bisoprolol (ZEBETA) 5 MG tablet Take 0.5 tablets (2.5 mg total) by mouth at bedtime. 45 tablet 1   buPROPion (WELLBUTRIN XL) 300 MG 24 hr tablet Take 1 tablet by mouth daily.     calcium carbonate (TUMS - DOSED IN MG ELEMENTAL CALCIUM) 500 MG chewable tablet Chew 1 tablet by mouth as needed for indigestion or heartburn.     Cholecalciferol 50 MCG (2000 UT) CAPS Take by mouth.     ciprofloxacin (CIPRO) 500 MG tablet Take 500 mg by mouth 2 (two) times daily.     diclofenac (CATAFLAM) 50 MG tablet Take 1 tablet (50 mg total) by mouth 3 (three) times daily as needed. 90 tablet 5   DULoxetine (CYMBALTA) 60 MG capsule TAKE 2 CAPSULES BY MOUTH DAILY 180 capsule 1   eletriptan (RELPAX) 40 MG tablet Take 1 tablet (40 mg total) by mouth as needed for migraine or headache. May repeat in 2 hours if headache persists or recurs.  Maximum 2 tablets in 24 hours. 10 tablet 5   EPINEPHrine 0.3 mg/0.3 mL IJ SOAJ injection Inject into the muscle.     ipratropium (ATROVENT) 0.03 % nasal spray SMARTSIG:1 Spray(s) Both Nares 1-3 Times Daily PRN     ketoconazole (NIZORAL) 2 % shampoo Apply 1 Application topically 3 (three) times a week.     meloxicam (MOBIC) 7.5 MG tablet Take 1 tablet (7.5 mg total) by mouth daily. 5 tablet 0   Multiple Vitamin (MULTIVITAMIN) tablet Take 1 tablet by mouth daily.     norethindrone-ethinyl estradiol (JUNEL 1/20) 1-20 MG-MCG tablet TAKE  1 TABLET BY MOUTH DAILY. TAKE CONTINUOUSLY, SKIP PLACEBO WEEK 112 tablet 3   Omega-3 Fatty Acids (FISH OIL) 1200 MG CAPS Take by mouth.     ondansetron (ZOFRAN-ODT) 4 MG disintegrating tablet Take 1 tablet (4 mg total) by mouth every 6 (six) hours as needed for nausea or vomiting. 15 tablet 0   ondansetron (ZOFRAN-ODT) 8 MG disintegrating tablet Take 1 tablet (8 mg total) by mouth every 8 (eight) hours as needed for nausea or vomiting. 20 tablet 5   promethazine (PHENERGAN) 25 MG tablet Take 1 tablet (25 mg total) by mouth every 6 (six) hours as needed for nausea or vomiting. 60 tablet  5   tiZANidine (ZANAFLEX) 4 MG tablet Take 0.5-1 tablets (2-4 mg total) by mouth 2 (two) times daily as needed for muscle spasms. 180 tablet 1   ZEPBOUND 5 MG/0.5ML Pen Inject 5 mg into the skin once a week.     ARIPiprazole (ABILIFY) 2 MG tablet Take 1 tablet by mouth daily.     fludrocortisone (FLORINEF) 0.1 MG tablet Take 1 tablet (0.1 mg total) by mouth daily. (Patient not taking: Reported on 04/08/2023) 90 tablet 3   Current Facility-Administered Medications  Medication Dose Route Frequency Provider Last Rate Last Admin   0.9 %  sodium chloride infusion  500 mL Intravenous Once Armbruster, Willaim Rayas, MD         ALLERGIES: Gluten meal, Pecan extract, Strawberry extract, Beef allergy, Other, and Shellfish allergy  Family History  Problem Relation Age of Onset   Arthritis Mother    Asthma Mother    Miscarriages / [place] Mother    Migraines Mother    COPD Father        per pt's mother-mild   Hypertension Father    Migraines Father    Asthma Brother    Migraines Brother    Anxiety disorder Brother    Arthritis Maternal Grandmother    Cancer Maternal Grandmother    Miscarriages / Stillbirths Maternal Grandmother    Diabetes Maternal Grandfather    Heart attack Maternal Grandfather    Heart disease Maternal Grandfather    Hyperlipidemia Maternal Grandfather    Hypertension Maternal Grandfather     Stroke Maternal Grandfather    Colon polyps Maternal Grandfather    Cancer Paternal Grandmother    Alcohol abuse Paternal Grandfather    Cancer Paternal Grandfather    COPD Paternal Grandfather    Heart attack Paternal Grandfather    Heart disease Paternal Grandfather    Hyperlipidemia Paternal Grandfather    Hypertension Paternal Grandfather    Asthma Brother    OCD Brother    Anxiety disorder Brother    Seizures Neg Hx    Depression Neg Hx    Bipolar disorder Neg Hx    Schizophrenia Neg Hx    ADD / ADHD Neg Hx    Autism Neg Hx    Colon cancer Neg Hx    Esophageal cancer Neg Hx    Rectal cancer Neg Hx    Stomach cancer Neg Hx     Social History   Socioeconomic History   Marital status: Married    Spouse name: Not on file   Number of children: Not on file   Years of education: Not on file   Highest education level: Not on file  Occupational History   Not on file  Tobacco Use   Smoking status: Never   Smokeless tobacco: Never  Vaping Use   Vaping Use: Never used  Substance and Sexual Activity   Alcohol use: Never   Drug use: Never   Sexual activity: Yes    Partners: Male    Birth control/protection: Pill  Other Topics Concern   Not on file  Social History Narrative   [name] is in the 12th grade at [employer]; she does well academically. She lives with her parents. She enjoys going to the gym, boxing, and playing with animals   Right handed   Drinks caffeine   An apartment one story    Social Determinants of Health   Financial Resource Strain: Not on file  Food Insecurity: No Food Insecurity (08/09/2022)   Hunger Vital Sign  Worried About Programme researcher, broadcasting/film/video in the Last Year: Never true    Ran Out of Food in the Last Year: Never true  Transportation Needs: No Transportation Needs (08/09/2022)   PRAPARE - Administrator, Civil Service (Medical): No    Lack of Transportation (Non-Medical): No  Physical Activity: Not on file  Stress: Not on file   Social Connections: Not on file  Intimate Partner Violence: Not At Risk (08/09/2022)   Humiliation, Afraid, Rape, and Kick questionnaire    Fear of Current or Ex-Partner: No    Emotionally Abused: No    Physically Abused: No    Sexually Abused: No    Review of Systems  All other systems reviewed and are negative.   PHYSICAL EXAMINATION:    BP 110/62   Temp 97.8 F (36.6 C)   Wt 133 lb (60.3 kg)   SpO2 100%   BMI 20.83 kg/m     General appearance: alert, cooperative and appears stated age  Pelvic: External genitalia:  no lesions, she does have an ~1 cm laceration in the posterior fourchette              Urethra:  normal appearing urethra with no masses, tenderness or lesions              Bartholins and Skenes: normal                 Vagina: normal appearing vagina with a slight increase in vaginal d/c              Cervix: no lesions               Chaperone was present for exam.  1. Dysuria She is on antibiotics for a presumed UTI, culture pending - Urinalysis, Complete -she had one documented UTI in 4/24, awaiting culture. Symptoms don't seem related to IC -If she has 3 documented UTI's, consider referral to Urology  2. Dyspareunia, female Recent, entry, some vaginal d/c - WET PREP FOR TRICH, YEAST, CLUE - SureSwab Advanced Vaginitis, TMA -Discussed using lubricant (samples of uberlube given), she should control the rate and depth of penetration with IC  3. Vaginal discharge - WET PREP FOR TRICH, YEAST, CLUE: negative - SureSwab Advanced Vaginitis, TMA  4. Perineal laceration involving fourchette, initial encounter Let this heal prior to attempting intercourse.   Over 20 minutes spent in total patient care.

## 2023-04-08 NOTE — Patient Instructions (Addendum)
Epidural [phone] See me 7-8 weeks after epidural 

## 2023-04-09 LAB — SURESWAB® ADVANCED VAGINITIS,TMA
CANDIDA SPECIES: NOT DETECTED
Candida glabrata: NOT DETECTED
SURESWAB(R) ADV BACTERIAL VAGINOSIS(BV),TMA: NEGATIVE
TRICHOMONAS VAGINALIS (TV),TMA: NOT DETECTED

## 2023-04-09 LAB — URINE CULTURE
MICRO NUMBER:: 15073620
Result:: NO GROWTH
SPECIMEN QUALITY:: ADEQUATE

## 2023-04-24 ENCOUNTER — Encounter: Payer: Self-pay | Admitting: Neurology

## 2023-04-27 ENCOUNTER — Encounter: Payer: Self-pay | Admitting: Obstetrics and Gynecology

## 2023-04-28 ENCOUNTER — Ambulatory Visit (INDEPENDENT_AMBULATORY_CARE_PROVIDER_SITE_OTHER): Payer: 59 | Admitting: Nurse Practitioner

## 2023-04-28 ENCOUNTER — Encounter: Payer: Self-pay | Admitting: Nurse Practitioner

## 2023-04-28 VITALS — BP 108/62 | HR 77 | Wt 131.0 lb

## 2023-04-28 DIAGNOSIS — N9411 Superficial (introital) dyspareunia: Secondary | ICD-10-CM | POA: Diagnosis not present

## 2023-04-28 DIAGNOSIS — S3141XA Laceration without foreign body of vagina and vulva, initial encounter: Secondary | ICD-10-CM | POA: Diagnosis not present

## 2023-04-28 MED ORDER — ESTRADIOL 0.1 MG/GM VA CREA
0.5000 g | TOPICAL_CREAM | Freq: Every day | VAGINAL | 0 refills | Status: AC
Start: 2023-04-28 — End: ?

## 2023-04-28 NOTE — Telephone Encounter (Signed)
Pt scheduled for 04/28/2023. Will route to provider and close.

## 2023-04-28 NOTE — Telephone Encounter (Signed)
She needs an OV to evaluate if tear is present. No intercourse until healed/pain resolves. Vit E vaginal suppositories to promote healing.

## 2023-04-28 NOTE — Progress Notes (Signed)
   Acute Office Visit  Subjective:    Patient ID: Kiara Brewer, female    DOB: 2000/12/10, 22 y.o.   MRN: [id number]   HPI 22 y.o. G0 presents today for vaginal tear x 1 week. H/O frequent vaginal tears with intercourse. Using silicone-based lubricants as recommended. Has done pelvic floor PT in the past. Has pain with initial insertion but then gets better. No bleeding with intercourse but does have spotting after occasionally. H/O anal fissure repair and she is wondering if she needs this done on her vagina as well. H/O Ehlers-Danlos likely making her more prone to tearing.   No LMP recorded. (Menstrual status: Oral contraceptives).    Review of Systems  Constitutional: Negative.   Genitourinary:  Positive for genital sores and vaginal pain. Negative for vaginal bleeding and vaginal discharge.       Objective:    Physical Exam Constitutional:      Appearance: Normal appearance.  Genitourinary:      BP 108/62   Pulse 77   Wt 131 lb (59.4 kg)   SpO2 100%   BMI 20.52 kg/m  Wt Readings from Last 3 Encounters:  04/28/23 131 lb (59.4 kg)  04/08/23 133 lb (60.3 kg)  04/08/23 134 lb (60.8 kg)        Patient informed chaperone available to be present for breast and/or pelvic exam. Patient has requested no chaperone to be present. Patient has been advised what will be completed during breast and pelvic exam.   Assessment & Plan:   Problem List Items Addressed This Visit   None Visit Diagnoses     Perineal laceration involving fourchette, initial encounter    -  Primary   Relevant Medications   estradiol (ESTRACE VAGINAL) 0.1 MG/GM vaginal cream   Introital dyspareunia       Relevant Medications   estradiol (ESTRACE VAGINAL) 0.1 MG/GM vaginal cream      Plan: Reassurance provided on superficial nature, sutures not recommended. Apply Vit E to promote healing, no intercourse until fully healed, gentle wiping and washing. Increased risk for tearing likely due to  history of Ehlers Danlos. Will try vaginal estrogen twice weekly. May insert internally or just apply to vaginal opening. Aware that cream may be costly and recommend Good Rx if so. Continue using silicone-based lubricants.     [name] DNP, 11:38 AM 04/28/2023

## 2023-05-04 ENCOUNTER — Ambulatory Visit
Admission: RE | Admit: 2023-05-04 | Discharge: 2023-05-04 | Disposition: A | Discharge status: Home / Self Care | Payer: 59 | Source: Ambulatory Visit | Attending: Family Medicine | Admitting: Family Medicine

## 2023-05-04 DIAGNOSIS — M5416 Radiculopathy, lumbar region: Secondary | ICD-10-CM

## 2023-05-04 MED ORDER — METHYLPREDNISOLONE ACETATE 40 MG/ML INJ SUSP (RADIOLOG
80.0000 mg | Freq: Once | INTRAMUSCULAR | Status: AC
  Administered 2023-05-04: 80 mg via EPIDURAL

## 2023-05-04 MED ORDER — IOPAMIDOL (ISOVUE-M 200) INJECTION 41%
1.0000 mL | Freq: Once | INTRAMUSCULAR | Status: AC
  Administered 2023-05-04: 1 mL via EPIDURAL

## 2023-05-04 NOTE — Discharge Instructions (Signed)

## 2023-05-06 NOTE — Progress Notes (Deleted)
NEUROLOGY FOLLOW UP OFFICE NOTE  Kiara Brewer [id number]  Assessment/Plan:   Chronic migraine *** POTS Complex regional pain syndrome   Migraine prevention:  ***. Migraine rescue:  eletriptan 40mg  Promethazine *** Limit use of pain relievers to no more than 2 days out of week to prevent risk of rebound or medication-overuse headache. Keep headache diary Follow up with pain management regarding CRPS. Follow up ***     Subjective:  Kiara Brewer is an 22 year old right-handed female with fibromyalgia and migraine who follows up for migraine     UPDATE: Last seen in August 2023 for first round of Botox.  She never returned because she was focusing on her workup at [facility] in [place] for recurrent syncope/dysautonomia. She was diagnosed with POTS and currently takes Florinef.  Intensity:  7/10 Duration:  several hours to all day Frequency:  daily Now with associated vomiting.   Frequency of abortive medication: *** Current NSAIDS/analgesics:  Excedrin Migraine, diclofenac 50mg , meloxicam Current triptans:  eletriptan 40mg  Current anti-emetic:  Phenergan 25mg , Zofran ODT *** Current muscle relaxants:  Tizanidine 2-4mg  BID PRN Current Antihypertensive medications:  bisoprolol Current Antidepressant medications:  Cymbalta 60mg  daily, Wellbutrin XL 300mg  daily Current Anticonvulsant medications:  Keppra 1000mg  BID (for neuralgia) Current CGRP inhibitor: none Other medications:  Florinef.  Caffeine:  Celcius energy drink Diet: Drinks a lot of water and Gatorade.  Snacks throughout day rather than large meals.   Exercise:  Lift weights 6 times a week. . Sometimes running Depression:  yes; Anxiety:  yes Sleep hygiene:  Falls asleep but trouble staying asleep. Other pain:  Reports worsening right lower leg pain and numbness (complex regional pain syndrome)   HISTORY:  Patient has complex medical history.   When she was in middle school, she fell on the ice  landing on her right leg.  It didn't seem too bad but over the next several days, she developed severe pain.  She started experiencing discoloration.  She subsequently was evaluated at the [facility] [place] and was diagnosed with complex regional pain syndrome.  In 2019, she began having numbness and tingling in her feet, mostly the right foot.  She would intermittently have a drop foot, causing her leg to give out.  She had a NCV-EMG of the right lower extremity in December 2019 but she was unable to tolerate it and it was abruptly stopped.  NCV showed prolonged distal motor latency and low motor amplitude of the right peroneal nerve with slowing above and below the fibular head.  Right sural and superficial peroneal nerves were normal.  On needle exam, the right tibialis anterior muscle was normal but she then refused to continue the study.  Lab work included negative workup for autoimmune disease - including ANA, sed rate, CRP, HLA-B27.  B12, folate, TSH, CK and aldolase have been normal.  She is under the care of pain management.   She has had headaches off and on since childhood but she started having more severe and frequent migraines in 2020.  She describes a severe stabbing and pounding pain behind the ears or in the eyes, either side.  There is associated nausea, vomiting, photophobia, phonophobia and osmophobia.  If she is able to take sumatriptan within 15 minutes of onset, then it resolves within an other, otherwise it lasts all day.  They occur 2 to 3 days a week.  She also has a daily dull headache for which she takes Excedrin daily.     Also  since 2020, she has had recurrent episodes of passing out.  She describes feeling hot and lightheaded with heaviness in her legs and then passes out.  It lasts only a few seconds.  No convulsions, incontinence, tongue/cheek biting.  No postictal confusion.  It occurs 2-3 times a day..  Sometimes preceded briefly by sharp severe headache.  It mostly occurs  with change in position, such as bending over, but some are spontaneous.  She has been diagnosed with orthostatic hypotension.  She feels lightheaded when she stands up from sitting.  Unclear if related to polypharmacy.  She has been referred cardiology.  TTE unremarkable with normal EF and no valvular abnormalities.  She was ordered a 14 day ZIO patch but only tolerated 4 days due to skin irritation.  Heart rate demonstrated 43-173bpm with average 93 bpm.  Rare PVCs and PACs.  Events corresponded to sinus rhythm or sinus tachycardia but no SVT.  Cardiology did not suspect POTS, Marfan's or Ehlers Danlos Syndrome.  Due to hitting her head from a fall, she had a CT of head of head without contrast on 08/29/2020 personally reviewed which was slightly limited by streak artifact from metal portions of hair extension but appeared normal.   MRI of brain with and without contrast on 01/18/2021 was normal.  Routine EEG on 01/10/2021 was normal.  48 hour ambulatory EEG in April 2022 was normal.  Captured 3 habitual events without electrographic correlate.       Past NSAIDS/analgesics:  Norco, Excedrin  Past abortive triptans:  sumatriptan tab, rizatriptan 10mg  Past muscle relaxants:  baclofen Past anti-emetic:  Zofran Past antihypertensive medications:  atenolol, metoprolol Past antidepressant medications:  amitriptyline Past anticonvulsant medications:  Topiramate, gabapentin, Lyrica Past CGRP inhibitor:  Qulipta (made headaches worse), Aimovig 140mg , Emgality Other past therapies:  prednisone     Family history of headache:  Father (migraines), mother (occasional headaches)  PAST MEDICAL HISTORY: Past Medical History:  Diagnosis Date   Anal pain    Anxiety    Arthritis    Complex regional pain syndrome of both lower extremities    per pt w/ right thigh numbness   Depression    Ehlers-Danlos disease    Exercise-induced asthma    Fainting episodes    Fibromyalgia    GERD (gastroesophageal reflux  disease)    History of concussion 04/2019   per pt no residual   History of Osgood-Schlatter disease    History of syncope    ED visit 08-29-2020 w/ cp, palpitations--- pt sent for cardiology evaluation , dr Lalla Brothers, office note 09-07-2020 dx orthostatic intolerence ;   echo in epic 09-14-2020 , normal;  event monitor 09-13-2020 no signfiance arrhythmia , SR/ST;  CT angio chest 09-02-2020  normal   Irritable bowel    Migraine without aura    Orthostatic hypotension    POTS (postural orthostatic tachycardia syndrome)    Sacral radiculopathy    Spondylolisthesis, lumbosacral region    L5--S1   Wears contact lenses     MEDICATIONS: Current Outpatient Medications on File Prior to Visit  Medication Sig Dispense Refill   albuterol (PROVENTIL HFA;VENTOLIN HFA) 108 (90 Base) MCG/ACT inhaler Inhale 2 puffs into the lungs every 6 (six) hours as needed for wheezing or shortness of breath. 1 Inhaler 3   ARIPiprazole (ABILIFY) 2 MG tablet Take 1 tablet by mouth daily.     B Complex-C (SUPER B COMPLEX PO) Take by mouth.     bisoprolol (ZEBETA) 5 MG tablet Take 0.5 tablets (  2.5 mg total) by mouth at bedtime. 45 tablet 1   buPROPion (WELLBUTRIN XL) 300 MG 24 hr tablet Take 1 tablet by mouth daily.     calcium carbonate (TUMS - DOSED IN MG ELEMENTAL CALCIUM) 500 MG chewable tablet Chew 1 tablet by mouth as needed for indigestion or heartburn.     Cholecalciferol 50 MCG (2000 UT) CAPS Take by mouth.     diclofenac (CATAFLAM) 50 MG tablet Take 1 tablet (50 mg total) by mouth 3 (three) times daily as needed. 90 tablet 5   DULoxetine (CYMBALTA) 60 MG capsule TAKE 2 CAPSULES BY MOUTH DAILY 180 capsule 1   eletriptan (RELPAX) 40 MG tablet Take 1 tablet (40 mg total) by mouth as needed for migraine or headache. May repeat in 2 hours if headache persists or recurs.  Maximum 2 tablets in 24 hours. 10 tablet 5   EPINEPHrine 0.3 mg/0.3 mL IJ SOAJ injection Inject into the muscle.     estradiol (ESTRACE VAGINAL)  0.1 MG/GM vaginal cream Place 0.5 g vaginally at bedtime. Initial dose: nightly x 2 weeks then twice weekly 42.5 g 0   fludrocortisone (FLORINEF) 0.1 MG tablet Take 1 tablet (0.1 mg total) by mouth daily. 90 tablet 3   ipratropium (ATROVENT) 0.03 % nasal spray SMARTSIG:1 Spray(s) Both Nares 1-3 Times Daily PRN     ketoconazole (NIZORAL) 2 % shampoo Apply 1 Application topically 3 (three) times a week.     meloxicam (MOBIC) 7.5 MG tablet Take 1 tablet (7.5 mg total) by mouth daily. 5 tablet 0   Multiple Vitamin (MULTIVITAMIN) tablet Take 1 tablet by mouth daily.     norethindrone-ethinyl estradiol (JUNEL 1/20) 1-20 MG-MCG tablet TAKE 1 TABLET BY MOUTH DAILY. TAKE CONTINUOUSLY, SKIP PLACEBO WEEK 112 tablet 3   Omega-3 Fatty Acids (FISH OIL) 1200 MG CAPS Take by mouth.     ondansetron (ZOFRAN-ODT) 4 MG disintegrating tablet Take 1 tablet (4 mg total) by mouth every 6 (six) hours as needed for nausea or vomiting. 15 tablet 0   ondansetron (ZOFRAN-ODT) 8 MG disintegrating tablet Take 1 tablet (8 mg total) by mouth every 8 (eight) hours as needed for nausea or vomiting. 20 tablet 5   promethazine (PHENERGAN) 25 MG tablet Take 1 tablet (25 mg total) by mouth every 6 (six) hours as needed for nausea or vomiting. 60 tablet 5   tiZANidine (ZANAFLEX) 4 MG tablet Take 0.5-1 tablets (2-4 mg total) by mouth 2 (two) times daily as needed for muscle spasms. 180 tablet 1   ZEPBOUND 5 MG/0.5ML Pen Inject 5 mg into the skin once a week.     Current Facility-Administered Medications on File Prior to Visit  Medication Dose Route Frequency Provider Last Rate Last Admin   0.9 %  sodium chloride infusion  500 mL Intravenous Once Armbruster, Willaim Rayas, MD        ALLERGIES: Allergies  Allergen Reactions   Gluten Meal     Other reaction(s): GI intolerance Vomiting   Pecan Extract Anaphylaxis and Hives   Strawberry Extract Anaphylaxis and Hives   Beef Allergy Hives    Other reaction(s): GI intolerance RED MEAT    Other Hives    Red Meat  and chicken   Shellfish Allergy Hives    lobster Other reaction(s): Other lobster lobster  lobster Other reaction(s): Other lobster    FAMILY HISTORY: Family History  Problem Relation Age of Onset   Arthritis Mother    Asthma Mother    Miscarriages / [place]  Mother    Migraines Mother    COPD Father        per pt's mother-mild   Hypertension Father    Migraines Father    Asthma Brother    Migraines Brother    Anxiety disorder Brother    Arthritis Maternal Grandmother    Cancer Maternal Grandmother    Miscarriages / Stillbirths Maternal Grandmother    Diabetes Maternal Grandfather    Heart attack Maternal Grandfather    Heart disease Maternal Grandfather    Hyperlipidemia Maternal Grandfather    Hypertension Maternal Grandfather    Stroke Maternal Grandfather    Colon polyps Maternal Grandfather    Cancer Paternal Grandmother    Alcohol abuse Paternal Grandfather    Cancer Paternal Grandfather    COPD Paternal Grandfather    Heart attack Paternal Grandfather    Heart disease Paternal Grandfather    Hyperlipidemia Paternal Grandfather    Hypertension Paternal Grandfather    Asthma Brother    OCD Brother    Anxiety disorder Brother    Seizures Neg Hx    Depression Neg Hx    Bipolar disorder Neg Hx    Schizophrenia Neg Hx    ADD / ADHD Neg Hx    Autism Neg Hx    Colon cancer Neg Hx    Esophageal cancer Neg Hx    Rectal cancer Neg Hx    Stomach cancer Neg Hx       Objective:  *** General: No acute distress.  Patient appears well-groomed.   Head:  Normocephalic/atraumatic Eyes:  Fundi examined but not visualized Neck: supple, no paraspinal tenderness, full range of motion Heart:  Regular rate and rhythm Neurological Exam: alert and oriented.  Speech fluent and not dysarthric, language intact.  CN II-XII intact. Bulk and tone normal, muscle strength 5/5 throughout.  Sensation to light touch intact.  Deep tendon reflexes 2+  throughout.  Finger to nose testing intact.  Gait normal, Romberg negative.   Shon Millet, DO

## 2023-05-07 ENCOUNTER — Ambulatory Visit: Payer: 59 | Admitting: Neurology

## 2023-05-19 ENCOUNTER — Encounter: Payer: Self-pay | Admitting: Neurology

## 2023-05-19 ENCOUNTER — Encounter: Payer: Self-pay | Admitting: Internal Medicine

## 2023-05-20 NOTE — Progress Notes (Unsigned)
NEUROLOGY FOLLOW UP OFFICE NOTE  Kiara Brewer [id number]  Assessment/Plan:   Chronic migraine *** Bilateral leg shaking.  Not consistent with seizure or other primary neurologic etiology POTS Complex regional pain syndrome   Migraine prevention:  ***. Migraine rescue:  eletriptan 40mg  Promethazine *** Limit use of pain relievers to no more than 2 days out of week to prevent risk of rebound or medication-overuse headache. Keep headache diary Follow up with pain management regarding CRPS. Follow up ***     Subjective:  Kiara Brewer is an 22 year old right-handed female with fibromyalgia and migraine who follows up for migraine     UPDATE: Last seen in August 2023 for first round of Botox.  She never returned because she was focusing on her workup at [facility] in [place] for recurrent syncope/dysautonomia. She was diagnosed with POTS and currently takes Florinef.  Intensity:  7/10 Duration:  several hours to all day Frequency:  daily Now with associated vomiting.   Frequency of abortive medication: *** Current NSAIDS/analgesics:  Excedrin Migraine, diclofenac 50mg , meloxicam Current triptans:  eletriptan 40mg  Current anti-emetic:  Phenergan 25mg , Zofran ODT *** Current muscle relaxants:  Tizanidine 2-4mg  BID PRN Current Antihypertensive medications:  bisoprolol Current Antidepressant medications:  Cymbalta 60mg  daily, Wellbutrin XL 300mg  daily Current Anticonvulsant medications:  Keppra 1000mg  BID (for neuralgia) Current CGRP inhibitor: none Other medications:  Florinef.  Caffeine:  Celcius energy drink Diet: Drinks a lot of water and Gatorade.  Snacks throughout day rather than large meals.   Exercise:  Lift weights 6 times a week. . Sometimes running Depression:  yes; Anxiety:  yes Sleep hygiene:  Falls asleep but trouble staying asleep. Other pain:  Reports worsening right lower leg pain and numbness (complex regional pain syndrome)   HISTORY:   Patient has complex medical history.   When she was in middle school, she fell on the ice landing on her right leg.  It didn't seem too bad but over the next several days, she developed severe pain.  She started experiencing discoloration.  She subsequently was evaluated at the [facility] [place] and was diagnosed with complex regional pain syndrome.  In 2019, she began having numbness and tingling in her feet, mostly the right foot.  She would intermittently have a drop foot, causing her leg to give out.  She had a NCV-EMG of the right lower extremity in December 2019 but she was unable to tolerate it and it was abruptly stopped.  NCV showed prolonged distal motor latency and low motor amplitude of the right peroneal nerve with slowing above and below the fibular head.  Right sural and superficial peroneal nerves were normal.  On needle exam, the right tibialis anterior muscle was normal but she then refused to continue the study.  Lab work included negative workup for autoimmune disease - including ANA, sed rate, CRP, HLA-B27.  B12, folate, TSH, CK and aldolase have been normal.  She is under the care of pain management.   She has had headaches off and on since childhood but she started having more severe and frequent migraines in 2020.  She describes a severe stabbing and pounding pain behind the ears or in the eyes, either side.  There is associated nausea, vomiting, photophobia, phonophobia and osmophobia.  If she is able to take sumatriptan within 15 minutes of onset, then it resolves within an other, otherwise it lasts all day.  They occur 2 to 3 days a week.  She also has a daily  dull headache for which she takes Excedrin daily.     Also since 2020, she has had recurrent episodes of passing out.  She describes feeling hot and lightheaded with heaviness in her legs and then passes out.  It lasts only a few seconds.  No convulsions, incontinence, tongue/cheek biting.  No postictal confusion.  It occurs  2-3 times a day..  Sometimes preceded briefly by sharp severe headache.  It mostly occurs with change in position, such as bending over, but some are spontaneous.  She has been diagnosed with orthostatic hypotension.  She feels lightheaded when she stands up from sitting.  Unclear if related to polypharmacy.  She has been referred cardiology.  TTE unremarkable with normal EF and no valvular abnormalities.  She was ordered a 14 day ZIO patch but only tolerated 4 days due to skin irritation.  Heart rate demonstrated 43-173bpm with average 93 bpm.  Rare PVCs and PACs.  Events corresponded to sinus rhythm or sinus tachycardia but no SVT.  Cardiology did not suspect POTS, Marfan's or Ehlers Danlos Syndrome.  Due to hitting her head from a fall, she had a CT of head of head without contrast on 08/29/2020 personally reviewed which was slightly limited by streak artifact from metal portions of hair extension but appeared normal.   MRI of brain with and without contrast on 01/18/2021 was normal.  Routine EEG on 01/10/2021 was normal.  48 hour ambulatory EEG in April 2022 was normal.  Captured 3 habitual events without electrographic correlate.       Past NSAIDS/analgesics:  Norco, Excedrin  Past abortive triptans:  sumatriptan tab, rizatriptan 10mg  Past muscle relaxants:  baclofen Past anti-emetic:  Zofran Past antihypertensive medications:  atenolol, metoprolol Past antidepressant medications:  amitriptyline Past anticonvulsant medications:  Topiramate, gabapentin, Lyrica Past CGRP inhibitor:  Qulipta (made headaches worse), Aimovig 140mg , Emgality Other past therapies:  prednisone     Family history of headache:  Father (migraines), mother (occasional headaches)  PAST MEDICAL HISTORY: Past Medical History:  Diagnosis Date   Anal pain    Anxiety    Arthritis    Complex regional pain syndrome of both lower extremities    per pt w/ right thigh numbness   Depression    Ehlers-Danlos disease     Exercise-induced asthma    Fainting episodes    Fibromyalgia    GERD (gastroesophageal reflux disease)    History of concussion 04/2019   per pt no residual   History of Osgood-Schlatter disease    History of syncope    ED visit 08-29-2020 w/ cp, palpitations--- pt sent for cardiology evaluation , dr Lalla Brothers, office note 09-07-2020 dx orthostatic intolerence ;   echo in epic 09-14-2020 , normal;  event monitor 09-13-2020 no signfiance arrhythmia , SR/ST;  CT angio chest 09-02-2020  normal   Irritable bowel    Migraine without aura    Orthostatic hypotension    POTS (postural orthostatic tachycardia syndrome)    Sacral radiculopathy    Spondylolisthesis, lumbosacral region    L5--S1   Wears contact lenses     MEDICATIONS: Current Outpatient Medications on File Prior to Visit  Medication Sig Dispense Refill   albuterol (PROVENTIL HFA;VENTOLIN HFA) 108 (90 Base) MCG/ACT inhaler Inhale 2 puffs into the lungs every 6 (six) hours as needed for wheezing or shortness of breath. 1 Inhaler 3   ARIPiprazole (ABILIFY) 2 MG tablet Take 1 tablet by mouth daily.     B Complex-C (SUPER B COMPLEX PO) Take by  mouth.     bisoprolol (ZEBETA) 5 MG tablet Take 0.5 tablets (2.5 mg total) by mouth at bedtime. 45 tablet 1   buPROPion (WELLBUTRIN XL) 300 MG 24 hr tablet Take 1 tablet by mouth daily.     calcium carbonate (TUMS - DOSED IN MG ELEMENTAL CALCIUM) 500 MG chewable tablet Chew 1 tablet by mouth as needed for indigestion or heartburn.     Cholecalciferol 50 MCG (2000 UT) CAPS Take by mouth.     diclofenac (CATAFLAM) 50 MG tablet Take 1 tablet (50 mg total) by mouth 3 (three) times daily as needed. 90 tablet 5   DULoxetine (CYMBALTA) 60 MG capsule TAKE 2 CAPSULES BY MOUTH DAILY 180 capsule 1   eletriptan (RELPAX) 40 MG tablet Take 1 tablet (40 mg total) by mouth as needed for migraine or headache. May repeat in 2 hours if headache persists or recurs.  Maximum 2 tablets in 24 hours. 10 tablet 5    EPINEPHrine 0.3 mg/0.3 mL IJ SOAJ injection Inject into the muscle.     estradiol (ESTRACE VAGINAL) 0.1 MG/GM vaginal cream Place 0.5 g vaginally at bedtime. Initial dose: nightly x 2 weeks then twice weekly 42.5 g 0   fludrocortisone (FLORINEF) 0.1 MG tablet Take 1 tablet (0.1 mg total) by mouth daily. 90 tablet 3   ipratropium (ATROVENT) 0.03 % nasal spray SMARTSIG:1 Spray(s) Both Nares 1-3 Times Daily PRN     ketoconazole (NIZORAL) 2 % shampoo Apply 1 Application topically 3 (three) times a week.     meloxicam (MOBIC) 7.5 MG tablet Take 1 tablet (7.5 mg total) by mouth daily. 5 tablet 0   Multiple Vitamin (MULTIVITAMIN) tablet Take 1 tablet by mouth daily.     norethindrone-ethinyl estradiol (JUNEL 1/20) 1-20 MG-MCG tablet TAKE 1 TABLET BY MOUTH DAILY. TAKE CONTINUOUSLY, SKIP PLACEBO WEEK 112 tablet 3   Omega-3 Fatty Acids (FISH OIL) 1200 MG CAPS Take by mouth.     ondansetron (ZOFRAN-ODT) 4 MG disintegrating tablet Take 1 tablet (4 mg total) by mouth every 6 (six) hours as needed for nausea or vomiting. 15 tablet 0   ondansetron (ZOFRAN-ODT) 8 MG disintegrating tablet Take 1 tablet (8 mg total) by mouth every 8 (eight) hours as needed for nausea or vomiting. 20 tablet 5   promethazine (PHENERGAN) 25 MG tablet Take 1 tablet (25 mg total) by mouth every 6 (six) hours as needed for nausea or vomiting. 60 tablet 5   tiZANidine (ZANAFLEX) 4 MG tablet Take 0.5-1 tablets (2-4 mg total) by mouth 2 (two) times daily as needed for muscle spasms. 180 tablet 1   ZEPBOUND 5 MG/0.5ML Pen Inject 5 mg into the skin once a week.     Current Facility-Administered Medications on File Prior to Visit  Medication Dose Route Frequency Provider Last Rate Last Admin   0.9 %  sodium chloride infusion  500 mL Intravenous Once Armbruster, Willaim Rayas, MD        ALLERGIES: Allergies  Allergen Reactions   Gluten Meal     Other reaction(s): GI intolerance Vomiting   Pecan Extract Anaphylaxis and Hives   Strawberry  Extract Anaphylaxis and Hives   Beef Allergy Hives    Other reaction(s): GI intolerance RED MEAT   Other Hives    Red Meat  and chicken   Shellfish Allergy Hives    lobster Other reaction(s): Other lobster lobster  lobster Other reaction(s): Other lobster    FAMILY HISTORY: Family History  Problem Relation Age of Onset  Arthritis Mother    Asthma Mother    Miscarriages / [place] Mother    Migraines Mother    COPD Father        per pt's mother-mild   Hypertension Father    Migraines Father    Asthma Brother    Migraines Brother    Anxiety disorder Brother    Arthritis Maternal Grandmother    Cancer Maternal Grandmother    Miscarriages / Stillbirths Maternal Grandmother    Diabetes Maternal Grandfather    Heart attack Maternal Grandfather    Heart disease Maternal Grandfather    Hyperlipidemia Maternal Grandfather    Hypertension Maternal Grandfather    Stroke Maternal Grandfather    Colon polyps Maternal Grandfather    Cancer Paternal Grandmother    Alcohol abuse Paternal Grandfather    Cancer Paternal Grandfather    COPD Paternal Grandfather    Heart attack Paternal Grandfather    Heart disease Paternal Grandfather    Hyperlipidemia Paternal Grandfather    Hypertension Paternal Grandfather    Asthma Brother    OCD Brother    Anxiety disorder Brother    Seizures Neg Hx    Depression Neg Hx    Bipolar disorder Neg Hx    Schizophrenia Neg Hx    ADD / ADHD Neg Hx    Autism Neg Hx    Colon cancer Neg Hx    Esophageal cancer Neg Hx    Rectal cancer Neg Hx    Stomach cancer Neg Hx       Objective:  *** General: No acute distress.  Patient appears well-groomed.   Head:  Normocephalic/atraumatic Eyes:  Fundi examined but not visualized Neck: supple, no paraspinal tenderness, full range of motion Heart:  Regular rate and rhythm Neurological Exam: alert and oriented.  Speech fluent and not dysarthric, language intact.  CN II-XII intact. Bulk and tone  normal, muscle strength 5/5 throughout.  Sensation to light touch intact.  Deep tendon reflexes 2+ throughout.  Finger to nose testing intact.  Gait normal, Romberg negative.   Shon Millet, DO

## 2023-05-21 ENCOUNTER — Other Ambulatory Visit (INDEPENDENT_AMBULATORY_CARE_PROVIDER_SITE_OTHER): Payer: 59

## 2023-05-21 ENCOUNTER — Ambulatory Visit (INDEPENDENT_AMBULATORY_CARE_PROVIDER_SITE_OTHER): Payer: 59 | Admitting: Neurology

## 2023-05-21 ENCOUNTER — Encounter: Payer: Self-pay | Admitting: Neurology

## 2023-05-21 VITALS — BP 118/60 | HR 96 | Ht 67.0 in | Wt 127.0 lb

## 2023-05-21 DIAGNOSIS — R202 Paresthesia of skin: Secondary | ICD-10-CM | POA: Diagnosis not present

## 2023-05-21 DIAGNOSIS — G4485 Primary stabbing headache: Secondary | ICD-10-CM | POA: Diagnosis not present

## 2023-05-21 DIAGNOSIS — R251 Tremor, unspecified: Secondary | ICD-10-CM | POA: Diagnosis not present

## 2023-05-21 DIAGNOSIS — G90A Postural orthostatic tachycardia syndrome (POTS): Secondary | ICD-10-CM | POA: Diagnosis not present

## 2023-05-21 LAB — COMPREHENSIVE METABOLIC PANEL
ALT: 48 U/L — ABNORMAL HIGH (ref 0–35)
AST: 23 U/L (ref 0–37)
Albumin: 4.7 g/dL (ref 3.5–5.2)
Alkaline Phosphatase: 36 U/L — ABNORMAL LOW (ref 39–117)
BUN: 17 mg/dL (ref 6–23)
CO2: 26 mEq/L (ref 19–32)
Calcium: 10.3 mg/dL (ref 8.4–10.5)
Chloride: 106 mEq/L (ref 96–112)
Creatinine, Ser: 0.92 mg/dL (ref 0.40–1.20)
GFR: 89.19 mL/min (ref >60.00)
Glucose, Bld: 95 mg/dL (ref 70–99)
Potassium: 4.6 mEq/L (ref 3.5–5.1)
Sodium: 139 mEq/L (ref 135–145)
Total Bilirubin: 0.7 mg/dL (ref 0.2–1.2)
Total Protein: 7.2 g/dL (ref 6.0–8.3)

## 2023-05-21 MED ORDER — GABAPENTIN 100 MG PO CAPS: ORAL_CAPSULE | ORAL | 0 refills | Status: DC

## 2023-05-21 MED ORDER — BISOPROLOL FUMARATE 5 MG PO TABS: 5.0000 mg | ORAL_TABLET | Freq: Every evening | ORAL | 1 refills | Status: DC

## 2023-05-21 NOTE — Patient Instructions (Signed)
Start gabapentin 100mg  - take 1 pill at bedtime for one week, then increase to 1 pill twice daily.  When time for refill, contact me with update and we can either continue dose or increase dose.   Check CMP

## 2023-05-26 ENCOUNTER — Encounter: Payer: Self-pay | Admitting: Neurology

## 2023-06-01 ENCOUNTER — Encounter: Payer: 59 | Attending: Physical Medicine and Rehabilitation | Admitting: Physical Medicine and Rehabilitation

## 2023-06-01 DIAGNOSIS — M797 Fibromyalgia: Secondary | ICD-10-CM | POA: Insufficient documentation

## 2023-06-01 DIAGNOSIS — M7918 Myalgia, other site: Secondary | ICD-10-CM | POA: Insufficient documentation

## 2023-06-01 DIAGNOSIS — M5416 Radiculopathy, lumbar region: Secondary | ICD-10-CM | POA: Insufficient documentation

## 2023-06-01 DIAGNOSIS — Q796 Ehlers-Danlos syndrome, unspecified: Secondary | ICD-10-CM | POA: Insufficient documentation

## 2023-06-01 DIAGNOSIS — M4317 Spondylolisthesis, lumbosacral region: Secondary | ICD-10-CM | POA: Insufficient documentation

## 2023-06-04 ENCOUNTER — Ambulatory Visit: Payer: 59 | Admitting: Physician Assistant

## 2023-06-28 ENCOUNTER — Encounter: Payer: Self-pay | Admitting: Internal Medicine

## 2023-06-30 NOTE — Telephone Encounter (Signed)
With her HR so slow, lets have her decrease her bisoprolol from 5 >> 2.5 plxz Thanks SK

## 2023-07-14 ENCOUNTER — Ambulatory Visit: Payer: 59 | Admitting: Neurology

## 2023-07-29 ENCOUNTER — Encounter: Payer: Self-pay | Admitting: Family Medicine

## 2023-08-17 ENCOUNTER — Ambulatory Visit: Payer: 59 | Admitting: Physician Assistant

## 2023-08-22 ENCOUNTER — Other Ambulatory Visit: Payer: Self-pay | Admitting: Physical Medicine and Rehabilitation

## 2023-09-25 ENCOUNTER — Other Ambulatory Visit: Payer: Self-pay

## 2023-09-28 ENCOUNTER — Other Ambulatory Visit: Payer: Self-pay | Admitting: Physical Medicine and Rehabilitation

## 2023-11-11 ENCOUNTER — Other Ambulatory Visit: Payer: Self-pay | Admitting: Internal Medicine

## 2023-11-27 NOTE — Progress Notes (Deleted)
 NEUROLOGY FOLLOW UP OFFICE NOTE  Kiara Brewer [id number]  Assessment/Plan:   Brief paroxysmal headaches behind the eyes.  Unclear etiology.  May be an atypical primary stabbing headache.   Bilateral leg shaking.  Not consistent with seizure or other primary neurologic etiology.  Unclear if it is functional. Will check CMP to evaluate for any potential electrolyte imbalance or liver dysfunction. POTS Complex regional pain syndrome   To treat for possible primary stabbing headache:  start gabapentin 100mg  at bedtime for one week, then increase to 100mg  twice daily.  We can gradually increase dose up to 400mg  twice daily as needed.  She already takes melatonin.  As she is already taking daily meloxicam, would not add daily indomethacin. Check CMP Follow up 6 months.     Subjective:  Kiara Brewer is an 23 year old right-handed female with fibromyalgia and migraine who follows up for headaches ***   UPDATE: To treat primary stabbing headache, she was started on gabapentin   Current NSAIDS/analgesics:  Excedrin Migraine, diclofenac 50mg , meloxicam Current triptans:  eletriptan 40mg  Current anti-emetic:  Phenergan 25mg , Zofran ODT Current muscle relaxants:  Tizanidine 2-4mg  BID PRN Current Antihypertensive medications:  bisoprolol Current Antidepressant medications:  Cymbalta 60mg  daily, Wellbutrin XL 300mg  daily Current Anticonvulsant medications:  gabapentin 100mg  twice daily *** Current CGRP inhibitor: none Current supplements:  melatonin Birth control:  Junel 1/20   Caffeine:  Celcius energy drink Diet: Drinks a lot of water and Gatorade.  Snacks throughout day rather than large meals.   Exercise:  Lift weights 6 times a week. . Sometimes running Depression:  yes; Anxiety:  yes Sleep hygiene:  Falls asleep but trouble staying asleep. Other pain:  Reports worsening right lower leg pain and numbness (complex regional pain syndrome)   HISTORY:  Diagnosed with POTS and  Ehler-Danlos syndrome at [facility].   When she was in middle school, she fell on the ice landing on her right leg.  It didn't seem too bad but over the next several days, she developed severe pain.  She started experiencing discoloration.  She subsequently was evaluated at the [facility] [place] and was diagnosed with complex regional pain syndrome.  In 2019, she began having numbness and tingling in her feet, mostly the right foot.  She would intermittently have a drop foot, causing her leg to give out.  She had a NCV-EMG of the right lower extremity in December 2019 but she was unable to tolerate it and it was abruptly stopped.  NCV showed prolonged distal motor latency and low motor amplitude of the right peroneal nerve with slowing above and below the fibular head.  Right sural and superficial peroneal nerves were normal.  On needle exam, the right tibialis anterior muscle was normal but she then refused to continue the study.  Lab work included negative workup for autoimmune disease - including ANA, sed rate, CRP, HLA-B27.  B12, folate, TSH, CK and aldolase have been normal.  She continues to have chronic pain.  She has hypermobility of joints, including the knees.  She has chronic low back pain.  MRI of lumbar spine on 04/01/2023 which showed pars defects causing Grade 1 aterolisthesis of L5-S1 with mild disc bulge with lateral recess stenosis at L5-S1 but no significant spinal canal or neural foraminal stenosis.  She is under the care of pain management.  She still has numbness in the legs below the knees at times, particularly when she stands.  Treated by Sports Medicine and has  received L5-S1 epidural injections.  She reports episodes of uncontrolled shaking of the leg.  One time, she had a nasal procedure and it occurred while sitting.  It was thought that it may have been side effect to the epinephrine.  Another time, it occurred at the gym while she was using a light barbell to perform  behind the neck press.  Both legs started to shake.  On another occasion, both legs started shaking while she was sitting.  Later, she was getting a pedicure when her right leg started to shake while her foot was being treated.  It is not just postural or occurs when bearing weight.  It happened while sitting relaxed as well.  No associated pain.     She has had headaches off and on since childhood but she started having more severe and frequent migraines in 2020.  She describes a severe stabbing and pounding pain behind the ears or in the eyes, either side.  There is associated nausea, vomiting, photophobia, phonophobia and osmophobia.  If she is able to take sumatriptan within 15 minutes of onset, then it resolves within an other, otherwise it lasts all day.  They occur 2 to 3 days a week.  She also has a daily dull headache for which she takes Excedrin daily.     Also since 2020, she has had recurrent episodes of passing out.  She describes feeling hot and lightheaded with heaviness in her legs and then passes out.  It lasts only a few seconds.  No convulsions, incontinence, tongue/cheek biting.  No postictal confusion.  It occurs 2-3 times a day..  Sometimes preceded briefly by sharp severe headache.  It mostly occurs with change in position, such as bending over, but some are spontaneous.  She has been diagnosed with orthostatic hypotension.  She feels lightheaded when she stands up from sitting.  Unclear if related to polypharmacy.  She has been referred cardiology.  TTE unremarkable with normal EF and no valvular abnormalities.  She was ordered a 14 day ZIO patch but only tolerated 4 days due to skin irritation.  Heart rate demonstrated 43-173bpm with average 93 bpm.  Rare PVCs and PACs.  Events corresponded to sinus rhythm or sinus tachycardia but no SVT.  Cardiology did not suspect POTS, Marfan's or Ehlers Danlos Syndrome.  Due to hitting her head from a fall, she had a CT of head of head without  contrast on 08/29/2020 personally reviewed which was slightly limited by streak artifact from metal portions of hair extension but appeared normal.   MRI of brain with and without contrast on 01/18/2021 was normal.  Routine EEG on 01/10/2021 was normal.  48 hour ambulatory EEG in April 2022 was normal.  Captured 3 habitual events without electrographic correlate.       Past NSAIDS/analgesics:  Norco, Excedrin  Past abortive triptans:  sumatriptan tab, rizatriptan 10mg  Past muscle relaxants:  baclofen Past anti-emetic:  Zofran Past antihypertensive medications:  atenolol, metoprolol Past antidepressant medications:  amitriptyline Past anticonvulsant medications:  Topiramate, gabapentin, Lyrica, Keppra (for neuralgia) Past CGRP inhibitor:  Qulipta (made headaches worse), Aimovig 140mg , Emgality Other past therapies:  prednisone, Botox (one round)     Family history of headache:  Father (migraines), mother (occasional headaches)  PAST MEDICAL HISTORY: Past Medical History:  Diagnosis Date   Anal pain    Anxiety    Arthritis    Complex regional pain syndrome of both lower extremities    per pt w/ right thigh numbness  Depression    Ehlers-Danlos disease    Exercise-induced asthma    Fainting episodes    Fibromyalgia    GERD (gastroesophageal reflux disease)    History of concussion 04/2019   per pt no residual   History of Osgood-Schlatter disease    History of syncope    ED visit 08-29-2020 w/ cp, palpitations--- pt sent for cardiology evaluation , dr Lalla Brothers, office note 09-07-2020 dx orthostatic intolerence ;   echo in epic 09-14-2020 , normal;  event monitor 09-13-2020 no signfiance arrhythmia , SR/ST;  CT angio chest 09-02-2020  normal   Irritable bowel    Migraine without aura    Orthostatic hypotension    POTS (postural orthostatic tachycardia syndrome)    Sacral radiculopathy    Spondylolisthesis, lumbosacral region    L5--S1   Wears contact lenses      MEDICATIONS: Current Outpatient Medications on File Prior to Visit  Medication Sig Dispense Refill   albuterol (PROVENTIL HFA;VENTOLIN HFA) 108 (90 Base) MCG/ACT inhaler Inhale 2 puffs into the lungs every 6 (six) hours as needed for wheezing or shortness of breath. 1 Inhaler 3   B Complex-C (SUPER B COMPLEX PO) Take by mouth.     bisoprolol (ZEBETA) 5 MG tablet TAKE 0.5 TABLETS (2.5 MG TOTAL) BY MOUTH AT BEDTIME. 45 tablet 0   buPROPion (WELLBUTRIN XL) 300 MG 24 hr tablet Take 1 tablet by mouth daily.     calcium carbonate (TUMS - DOSED IN MG ELEMENTAL CALCIUM) 500 MG chewable tablet Chew 1 tablet by mouth as needed for indigestion or heartburn.     Cholecalciferol 50 MCG (2000 UT) CAPS Take by mouth.     diclofenac (CATAFLAM) 50 MG tablet Take 1 tablet (50 mg total) by mouth 3 (three) times daily as needed. 90 tablet 5   dicyclomine (BENTYL) 20 MG tablet TAKE 1 TABLET EVERY 6 HOURS AS NEEDED FOR ABDOMINAL PAIN     DULoxetine (CYMBALTA) 60 MG capsule TAKE 2 CAPSULES BY MOUTH EVERY DAY 180 capsule 1   eletriptan (RELPAX) 40 MG tablet Take 1 tablet (40 mg total) by mouth as needed for migraine or headache. May repeat in 2 hours if headache persists or recurs.  Maximum 2 tablets in 24 hours. 10 tablet 5   EPINEPHrine 0.3 mg/0.3 mL IJ SOAJ injection Inject into the muscle.     estradiol (ESTRACE VAGINAL) 0.1 MG/GM vaginal cream Place 0.5 g vaginally at bedtime. Initial dose: nightly x 2 weeks then twice weekly 42.5 g 0   gabapentin (NEURONTIN) 100 MG capsule Take 1 capsule at bedtime for one week, then increase to 1 capsule twice daily. 60 capsule 0   ipratropium (ATROVENT) 0.03 % nasal spray SMARTSIG:1 Spray(s) Both Nares 1-3 Times Daily PRN     ketoconazole (NIZORAL) 2 % shampoo Apply 1 Application topically 3 (three) times a week.     meloxicam (MOBIC) 7.5 MG tablet Take 1 tablet (7.5 mg total) by mouth daily. 5 tablet 0   Multiple Vitamin (MULTIVITAMIN) tablet Take 1 tablet by mouth daily.      Norethindrone Acet-Ethinyl Est (JUNEL 1/20 PO)      norethindrone-ethinyl estradiol (JUNEL 1/20) 1-20 MG-MCG tablet TAKE 1 TABLET BY MOUTH DAILY. TAKE CONTINUOUSLY, SKIP PLACEBO WEEK 112 tablet 3   Omega-3 Fatty Acids (FISH OIL) 1200 MG CAPS Take by mouth.     ondansetron (ZOFRAN-ODT) 4 MG disintegrating tablet Take 1 tablet (4 mg total) by mouth every 6 (six) hours as needed for nausea or vomiting.  15 tablet 0   ondansetron (ZOFRAN-ODT) 8 MG disintegrating tablet Take 1 tablet (8 mg total) by mouth every 8 (eight) hours as needed for nausea or vomiting. 20 tablet 5   pramipexole (MIRAPEX) 0.5 MG tablet Take 0.5 mg by mouth at bedtime.     pramipexole (MIRAPEX) 1 MG tablet 1 TAB(S) ORALLY DAILY AT NIGHT 90 DAYS     promethazine (PHENERGAN) 25 MG tablet Take 1 tablet (25 mg total) by mouth every 6 (six) hours as needed for nausea or vomiting. 60 tablet 5   temazepam (RESTORIL) 15 MG capsule TAKE 1 CAPSULE BY MOUTH EVERY DAY AT BEDTIME     tiZANidine (ZANAFLEX) 4 MG tablet Take 0.5-1 tablets (2-4 mg total) by mouth 2 (two) times daily as needed for muscle spasms. 180 tablet 1   traZODone (DESYREL) 50 MG tablet TAKE 1 TABLET BY MOUTH EVERY DAY FOR 90 DAYS     ZEPBOUND 2.5 MG/0.5ML Pen 0.5 ML SUBCUTANEOUSLY ONCE A WEEK 28 DAYS     ZEPBOUND 5 MG/0.5ML Pen Inject 5 mg into the skin once a week. Every other week     Current Facility-Administered Medications on File Prior to Visit  Medication Dose Route Frequency Provider Last Rate Last Admin   0.9 %  sodium chloride infusion  500 mL Intravenous Once Armbruster, Willaim Rayas, MD        ALLERGIES: Allergies  Allergen Reactions   Gluten Meal     Other reaction(s): GI intolerance Vomiting   Pecan Extract Anaphylaxis and Hives   Strawberry Extract Anaphylaxis and Hives   Beef Allergy Hives    Other reaction(s): GI intolerance RED MEAT   Other Hives    Red Meat  and chicken   Shellfish Allergy Hives    lobster Other reaction(s):  Other lobster lobster  lobster Other reaction(s): Other lobster    FAMILY HISTORY: Family History  Problem Relation Age of Onset   Arthritis Mother    Asthma Mother    Miscarriages / [place] Mother    Migraines Mother    COPD Father        per pt's mother-mild   Hypertension Father    Migraines Father    Asthma Brother    Migraines Brother    Anxiety disorder Brother    Arthritis Maternal Grandmother    Cancer Maternal Grandmother    Miscarriages / Stillbirths Maternal Grandmother    Diabetes Maternal Grandfather    Heart attack Maternal Grandfather    Heart disease Maternal Grandfather    Hyperlipidemia Maternal Grandfather    Hypertension Maternal Grandfather    Stroke Maternal Grandfather    Colon polyps Maternal Grandfather    Cancer Paternal Grandmother    Alcohol abuse Paternal Grandfather    Cancer Paternal Grandfather    COPD Paternal Grandfather    Heart attack Paternal Grandfather    Heart disease Paternal Grandfather    Hyperlipidemia Paternal Grandfather    Hypertension Paternal Grandfather    Asthma Brother    OCD Brother    Anxiety disorder Brother    Seizures Neg Hx    Depression Neg Hx    Bipolar disorder Neg Hx    Schizophrenia Neg Hx    ADD / ADHD Neg Hx    Autism Neg Hx    Colon cancer Neg Hx    Esophageal cancer Neg Hx    Rectal cancer Neg Hx    Stomach cancer Neg Hx       Objective:  *** General: No  acute distress.  Patient appears well-groomed.   Head:  Normocephalic/atraumatic Eyes:  Fundi examined but not visualized Neck: supple, no paraspinal tenderness, full range of motion Heart:  Regular rate and rhythm Neurological Exam: ***   Shon Millet, DO

## 2023-11-30 ENCOUNTER — Ambulatory Visit: Payer: 59 | Admitting: Neurology

## 2023-12-01 ENCOUNTER — Other Ambulatory Visit: Payer: Self-pay | Admitting: Physical Medicine and Rehabilitation

## 2023-12-21 NOTE — Progress Notes (Unsigned)
 [facility] [address] [place] [address] Phone: [phone] Subjective:   Kiara Brewer am a scribe for Dr. Katrinka Blazing.   I'm seeing this patient by the request  of:  Cox, Burman Foster, NP  CC: Shoulder and thumb pain  [id number]  Kiara Brewer is a 23 y.o. female coming in with complaint of L shoulder and R thumb pain. Last seen in Summer of 2024 for back pain. Patient states that her R [facility] joint is popping out of place for past 6-8 weeks.   Also c/o L shoulder pain especially with extension. Pain for past year but flared up recently. Pain in middle deltoid. Had xray done recently.   Brain fog seems to be worsening on some days but overall is not better in general.   Patient injured her left thumb back in October.  Past Medical History:  Diagnosis Date   Anal pain    Anxiety    Arthritis    Complex regional pain syndrome of both lower extremities    per pt w/ right thigh numbness   Depression    Ehlers-Danlos disease    Exercise-induced asthma    Fainting episodes    Fibromyalgia    GERD (gastroesophageal reflux disease)    History of concussion 04/2019   per pt no residual   History of Osgood-Schlatter disease    History of syncope    ED visit 08-29-2020 w/ cp, palpitations--- pt sent for cardiology evaluation , dr Lalla Brothers, office note 09-07-2020 dx orthostatic intolerence ;   echo in epic 09-14-2020 , normal;  event monitor 09-13-2020 no signfiance arrhythmia , SR/ST;  CT angio chest 09-02-2020  normal   Irritable bowel    Migraine without aura    Orthostatic hypotension    POTS (postural orthostatic tachycardia syndrome)    Sacral radiculopathy    Spondylolisthesis, lumbosacral region    L5--S1   Wears contact lenses    Past Surgical History:  Procedure Laterality Date   RECTAL EXAM UNDER ANESTHESIA N/A 11/22/2020   Procedure: ANAL  EXAM UNDER ANESTHESIA;  Surgeon: Romie Levee, MD;  Location: [facility];   Service: General;  Laterality: N/A;   SPHINCTEROTOMY N/A 11/22/2020   Procedure: CHEMICAL SPHINCTEROTOMY/ FISSURECTOMY;  Surgeon: Romie Levee, MD;  Location: [facility] [place];  Service: General;  Laterality: N/A;   TONSILLECTOMY AND ADENOIDECTOMY  2010   WISDOM TOOTH EXTRACTION  2020   Social History   Socioeconomic History   Marital status: Married    Spouse name: Not on file   Number of children: Not on file   Years of education: Not on file   Highest education level: Not on file  Occupational History   Not on file  Tobacco Use   Smoking status: Never   Smokeless tobacco: Never  Vaping Use   Vaping status: Never Used  Substance and Sexual Activity   Alcohol use: Never   Drug use: Never   Sexual activity: Yes    Partners: Male    Birth control/protection: Pill  Other Topics Concern   Not on file  Social History Narrative   Kiara Brewer is in the 12th grade at [employer]; she does well academically. She lives with her parents. She enjoys going to the gym, boxing, and playing with animals   Right handed   Drinks caffeine   An apartment one story    Social Drivers of Health   Financial Resource Strain: Low Risk  (07/11/2022)  Received from [facility], [facility]   Overall Financial Resource Strain (CARDIA)    Difficulty of Paying Living Expenses: Not hard at all  Food Insecurity: No Food Insecurity (08/09/2022)   Hunger Vital Sign    Worried About Running Out of Food in the Last Year: Never true    Ran Out of Food in the Last Year: Never true  Transportation Needs: No Transportation Needs (08/09/2022)   PRAPARE - Administrator, Civil Service (Medical): No    Lack of Transportation (Non-Medical): No  Recent Concern: Transportation Needs - Unmet Transportation Needs (07/11/2022)   Received from [facility], [facility]   [place] - Transportation    Lack of Transportation (Medical): No    Lack of Transportation (Non-Medical): Yes  Physical Activity:  Sufficiently Active (07/11/2022)   Received [facility], [facility]   Exercise Vital Sign    Days of Exercise per Week: 5 days    Minutes of Exercise per Session: 40 min  Stress: Not on file  Social Connections: Unknown (02/27/2022)   Received from [facility], [facility]   Social Network    Social Network: Not on file   Allergies  Allergen Reactions   Gluten Meal     Other reaction(s): GI intolerance Vomiting   Pecan Extract Anaphylaxis and Hives   Strawberry Extract Anaphylaxis and Hives   Beef Allergy Hives    Other reaction(s): GI intolerance RED MEAT   Other Hives    Red Meat  and chicken   Shellfish Allergy Hives    lobster Other reaction(s): Other lobster lobster  lobster Other reaction(s): Other lobster   Family History  Problem Relation Age of Onset   Arthritis Mother    Asthma Mother    Miscarriages / Stillbirths Mother    Migraines Mother    COPD Father        per pt's mother-mild   Hypertension Father    Migraines Father    Asthma Brother    Migraines Brother    Anxiety disorder Brother    Arthritis Maternal Grandmother    Cancer Maternal Grandmother    Miscarriages / Stillbirths Maternal Grandmother    Diabetes Maternal Grandfather    Heart attack Maternal Grandfather    Heart disease Maternal Grandfather    Hyperlipidemia Maternal Grandfather    Hypertension Maternal Grandfather    Stroke Maternal Grandfather    Colon polyps Maternal Grandfather    Cancer Paternal Grandmother    Alcohol abuse Paternal Grandfather    Cancer Paternal Grandfather    COPD Paternal Grandfather    Heart attack Paternal Grandfather    Heart disease Paternal Grandfather    Hyperlipidemia Paternal Grandfather    Hypertension Paternal Grandfather    Asthma Brother    OCD Brother    Anxiety disorder Brother    Seizures Neg Hx    Depression Neg Hx    Bipolar disorder Neg Hx    Schizophrenia Neg Hx    ADD / ADHD Neg Hx    Autism Neg Hx    Colon cancer  Neg Hx    Esophageal cancer Neg Hx    Rectal cancer Neg Hx    Stomach cancer Neg Hx     Current Outpatient Medications (Endocrine & Metabolic):    Norethindrone Acet-Ethinyl Est (JUNEL 1/20 PO),    norethindrone-ethinyl estradiol (JUNEL 1/20) 1-20 MG-MCG tablet, TAKE 1 TABLET BY MOUTH DAILY. TAKE CONTINUOUSLY, SKIP PLACEBO WEEK   Current Outpatient Medications (Cardiovascular):  bisoprolol (ZEBETA) 5 MG tablet, TAKE 0.5 TABLETS (2.5 MG TOTAL) BY MOUTH AT BEDTIME.   EPINEPHrine 0.3 mg/0.3 mL IJ SOAJ injection, Inject into the muscle.   Current Outpatient Medications (Respiratory):    albuterol (PROVENTIL HFA;VENTOLIN HFA) 108 (90 Base) MCG/ACT inhaler, Inhale 2 puffs into the lungs every 6 (six) hours as needed for wheezing or shortness of breath.   ipratropium (ATROVENT) 0.03 % nasal spray, SMARTSIG:1 Spray(s) Both Nares 1-3 Times Daily PRN   promethazine (PHENERGAN) 25 MG tablet, Take 1 tablet (25 mg total) by mouth every 6 (six) hours as needed for nausea or vomiting.   Current Outpatient Medications (Analgesics):    diclofenac (CATAFLAM) 50 MG tablet, Take 1 tablet (50 mg total) by mouth 3 (three) times daily as needed.   eletriptan (RELPAX) 40 MG tablet, Take 1 tablet (40 mg total) by mouth as needed for migraine or headache. May repeat in 2 hours if headache persists or recurs.  Maximum 2 tablets in 24 hours.   meloxicam (MOBIC) 7.5 MG tablet, Take 1 tablet (7.5 mg total) by mouth daily.     Current Outpatient Medications (Other):    B Complex-C (SUPER B COMPLEX PO), Take by mouth.   buPROPion (WELLBUTRIN XL) 300 MG 24 hr tablet, Take 1 tablet by mouth daily.   calcium carbonate (TUMS - DOSED IN MG ELEMENTAL CALCIUM) 500 MG chewable tablet, Chew 1 tablet by mouth as needed for indigestion or heartburn.   Cholecalciferol 50 MCG (2000 UT) CAPS, Take by mouth.   dicyclomine (BENTYL) 20 MG tablet, TAKE 1 TABLET EVERY 6 HOURS AS NEEDED FOR ABDOMINAL PAIN   DULoxetine (CYMBALTA)  60 MG capsule, TAKE 2 CAPSULES BY MOUTH EVERY DAY   estradiol (ESTRACE VAGINAL) 0.1 MG/GM vaginal cream, Place 0.5 g vaginally at bedtime. Initial dose: nightly x 2 weeks then twice weekly   gabapentin (NEURONTIN) 100 MG capsule, Take 1 capsule at bedtime for one week, then increase to 1 capsule twice daily.   ketoconazole (NIZORAL) 2 % shampoo, Apply 1 Application topically 3 (three) times a week.   Multiple Vitamin (MULTIVITAMIN) tablet, Take 1 tablet by mouth daily.   Omega-3 Fatty Acids (FISH OIL) 1200 MG CAPS, Take by mouth.   ondansetron (ZOFRAN-ODT) 4 MG disintegrating tablet, Take 1 tablet (4 mg total) by mouth every 6 (six) hours as needed for nausea or vomiting.   ondansetron (ZOFRAN-ODT) 8 MG disintegrating tablet, Take 1 tablet (8 mg total) by mouth every 8 (eight) hours as needed for nausea or vomiting.   pramipexole (MIRAPEX) 0.5 MG tablet, Take 0.5 mg by mouth at bedtime.   pramipexole (MIRAPEX) 1 MG tablet, 1 TAB(S) ORALLY DAILY AT NIGHT 90 DAYS   temazepam (RESTORIL) 15 MG capsule, TAKE 1 CAPSULE BY MOUTH EVERY DAY AT BEDTIME   tiZANidine (ZANAFLEX) 4 MG tablet, TAKE 1/2 TO 1 TABLET BY MOUTH 2 TIMES DAILY AS NEEDED FOR MUSCLE SPASMS.   traZODone (DESYREL) 50 MG tablet, TAKE 1 TABLET BY MOUTH EVERY DAY FOR 90 DAYS   ZEPBOUND 2.5 MG/0.5ML Pen, 0.5 ML SUBCUTANEOUSLY ONCE A WEEK 28 DAYS   ZEPBOUND 5 MG/0.5ML Pen, Inject 5 mg into the skin once a week. Every other week  Current Facility-Administered Medications (Other):    0.9 %  sodium chloride infusion   Reviewed prior external information including notes and imaging from  primary care provider As well as notes that were available from care everywhere and other healthcare systems.  Past medical history, social, surgical and family history  all reviewed in electronic medical record.  No pertanent information unless stated regarding to the chief complaint.   Review of Systems:  No headache, visual changes, nausea, vomiting,  diarrhea, constipation, dizziness, abdominal pain, skin rash, fevers, chills, night sweats, weight loss, swollen lymph nodes, body aches, joint swelling, chest pain, shortness of breath, mood changes. POSITIVE muscle aches  Objective  Blood pressure 114/76, pulse 93, height 5\' 7"  (1.702 m), weight 123 lb (55.8 kg), SpO2 99%.   General: No apparent distress alert and oriented x3 mood and affect normal, dressed appropriately.  HEENT: Pupils equal, extraocular movements intact  Respiratory: Patient's speak in full sentences and does not appear short of breath  Cardiovascular: No lower extremity edema, non tender, no erythema  Shoulder exam shows patient has some very mild impingement noted with Neer and Hawkins.  Good strength of the rotator cuff noted.  Thumb exam shows patient does have mild tenderness over the [facility] joint.  Otherwise can continue to move relatively well.   Limited muscular skeletal ultrasound was performed and interpreted by Antoine Primas, M   [facility] joint is unremarkable.  Patient's UCL appears to be intact and no significant difficulty.   Impression and Recommendations:    The above documentation has been reviewed and is accurate and complete Judi Saa, DO

## 2023-12-22 ENCOUNTER — Ambulatory Visit (INDEPENDENT_AMBULATORY_CARE_PROVIDER_SITE_OTHER): Payer: 59 | Admitting: Family Medicine

## 2023-12-22 ENCOUNTER — Other Ambulatory Visit: Payer: Self-pay

## 2023-12-22 ENCOUNTER — Encounter: Payer: Self-pay | Admitting: Family Medicine

## 2023-12-22 VITALS — BP 114/76 | HR 93 | Ht 67.0 in | Wt 123.0 lb

## 2023-12-22 DIAGNOSIS — M79644 Pain in right finger(s): Secondary | ICD-10-CM | POA: Diagnosis not present

## 2023-12-22 DIAGNOSIS — M2550 Pain in unspecified joint: Secondary | ICD-10-CM | POA: Diagnosis not present

## 2023-12-22 DIAGNOSIS — Q796 Ehlers-Danlos syndrome, unspecified: Secondary | ICD-10-CM | POA: Diagnosis not present

## 2023-12-22 LAB — URIC ACID: Uric Acid, Serum: 4 mg/dL (ref 2.4–7.0)

## 2023-12-22 LAB — IBC PANEL
Iron: 197 ug/dL — ABNORMAL HIGH (ref 42–145)
Saturation Ratios: 41.1 % (ref 20.0–50.0)
TIBC: 478.8 ug/dL — ABNORMAL HIGH (ref 250.0–450.0)
Transferrin: 342 mg/dL (ref 212.0–360.0)

## 2023-12-22 LAB — VITAMIN B12: Vitamin B-12: 600 pg/mL (ref 211–911)

## 2023-12-22 LAB — FOLLICLE STIMULATING HORMONE: FSH: 8.8 m[IU]/mL

## 2023-12-22 LAB — SEDIMENTATION RATE: Sed Rate: 3 mm/h (ref 0–20)

## 2023-12-22 LAB — LUTEINIZING HORMONE: LH: 8.3 m[IU]/mL

## 2023-12-22 LAB — TSH: TSH: 0.9 u[IU]/mL (ref 0.35–5.50)

## 2023-12-22 LAB — FERRITIN: Ferritin: 33.4 ng/mL (ref 10.0–291.0)

## 2023-12-22 LAB — VITAMIN D 25 HYDROXY (VIT D DEFICIENCY, FRACTURES): VITD: 50.36 ng/mL (ref 30.00–100.00)

## 2023-12-22 NOTE — Assessment & Plan Note (Signed)
 Will recheck to see to make sure her calcium has come down.

## 2023-12-22 NOTE — Patient Instructions (Addendum)
 Thumb spica at night Exercises for shoulder Labs today See me in 2 months

## 2023-12-22 NOTE — Assessment & Plan Note (Signed)
 Does have a connective tissue disorder and did increase the likelihood of patient having some of these injuries.  We discussed bracing at night, for the thumb.  Discussed home exercises and icing regimen.  Discussed with patient to continue to stay active otherwise.  Would like to get laboratory workup with patient having some hypercalcemia previously as well as last time patient had labs checked did have an elevation in LFTs.  Patient is going to try this and see how she responds.  Discussed which activities to do and which ones to avoid.  Increase activity slowly.  Follow-up again in 6 to 8 weeks otherwise.

## 2023-12-24 LAB — CALCIUM, IONIZED: Calcium, Ion: 5.3 mg/dL (ref 4.7–5.5)

## 2023-12-24 LAB — PTH, INTACT AND CALCIUM
Calcium: 9.9 mg/dL (ref 8.6–10.2)
PTH: 41 pg/mL (ref 16–77)

## 2024-01-08 ENCOUNTER — Telehealth: Payer: Self-pay | Admitting: Neurology

## 2024-01-08 ENCOUNTER — Encounter: Payer: Self-pay | Admitting: Neurology

## 2024-01-08 NOTE — Telephone Encounter (Signed)
 Pt. Cld having extreme headaches causing numbness to face and a sensation of her head is about to pop, put her in for June but would like to be seen ASAP. Would we be able to move her to urgent in March

## 2024-01-13 NOTE — Progress Notes (Unsigned)
 NEUROLOGY FOLLOW UP OFFICE NOTE  Kiara Brewer [id number]  Assessment/Plan:   Migraine with aura, with status migrainosus, intractable   In attempt to break intractable migraine, prednisone taper Migraine prevention:  Plan to start Vyepti 100mg  every 3 months. To address neck pain, will restart gabapentin titrating to 300mg  twice daily Migraine rescue:  Eletriptan 40mg  (advised to retry).  Also provided samples of Ubrelvy 100mg .  Zofran-ODT 8mg . Limit use of pain relievers to no more than 2 days out of week to prevent risk of rebound or medication-overuse headache. Keep headache diary Follow up 4 months.    Subjective:  Kiara Brewer is an 23 year old right-handed female with Ehlers Danlos syndrome (diagnosed at [facility]), POTS (diagnosed at [facility]), fibromyalgia and migraine who follows up for migraine  History supplemented by Sports Medicine notes and MRI of lumbar spine from 04/01/2023 personally reviewed.  She is accompanied by her mother who supplements history.   UPDATE: Last seen in July.  At that time, started on gabapentin for headaches.  Has not been taking as it was ineffective.  Moderate headaches had been occurring 4 days a week and severe headaches occurred 2 days a week.    For the past 2 weeks, she has had a persistent headache.  Had the flu a couple of weeks ago but no preceding injury.  Endorses a holocephalic or unilateral (either side) headache with neck pain/stiffness, facial pain, nausea and vomiting.  Neck pain aggravated with head turn to the left.  Pain fluctuates from moderate to severe daily.  Eletriptan ineffective but only tried once.  She had a neck massage on Monday which helped a little bit.  She has been to the Urgent Care twice in the past week where she received IVF and migraine headache cocktail which provided some relief for only a day.  She reported a low-grade fever of 100.1 yesterday, but when she went to UC, it  was normal.    Current NSAIDS/analgesics:  Excedrin Migraine, diclofenac 50mg , meloxicam Current triptans:  eletriptan 40mg  Current anti-emetic:  Zofran ODT 8mg  Current muscle relaxants:  Tizanidine 2-4mg  BID PRN Current Antihypertensive medications:  bisoprolol Current Antidepressant medications:  Cymbalta 60mg  daily, Wellbutrin XL 300mg  daily Current Anticonvulsant medications:  gabapentin 100mg  BID Current CGRP inhibitor: none Current supplements:  melatonin Birth control:  Junel 1/20   Caffeine:  Celcius energy drink Diet: Drinks a lot of water and Gatorade.  Snacks throughout day rather than large meals.   Exercise:  Lift weights 6 times a week. . Sometimes running Depression:  yes; Anxiety:  yes Sleep hygiene:  Falls asleep but trouble staying asleep. Other pain:  Reports worsening right lower leg pain and numbness (complex regional pain syndrome)   HISTORY:  Patient has complex medical history.   When she was in middle school, she fell on the ice landing on her right leg.  It didn't seem too bad but over the next several days, she developed severe pain.  She started experiencing discoloration.  She subsequently was evaluated at the [facility] and was diagnosed with complex regional pain syndrome.  In 2019, she began having numbness and tingling in her feet, mostly the right foot.  She would intermittently have a drop foot, causing her leg to give out.  She had a NCV-EMG of the right lower extremity in December 2019 but she was unable to tolerate it and it was abruptly stopped.  NCV showed prolonged distal motor latency and low motor  amplitude of the right peroneal nerve with slowing above and below the fibular head.  Right sural and superficial peroneal nerves were normal.  On needle exam, the right tibialis anterior muscle was normal but she then refused to continue the study.  Lab work included negative workup for autoimmune disease - including ANA, sed rate, CRP, HLA-B27.   B12, folate, TSH, CK and aldolase have been normal.  She continues to have chronic pain.  She has hypermobility of joints, including the knees.  She has chronic low back pain.  MRI of lumbar spine on 04/01/2023 which showed pars defects causing Grade 1 aterolisthesis of L5-S1 with mild disc bulge with lateral recess stenosis at L5-S1 but no significant spinal canal or neural foraminal stenosis.  She is under the care of pain management.  She still has numbness in the legs below the knees at times, particularly when she stands.     She has had headaches off and on since childhood but she started having more severe and frequent migraines in 2020.  She describes a severe stabbing and pounding pain behind the ears or in the eyes, either side.  There is associated nausea, vomiting, photophobia, phonophobia and osmophobia.  If she is able to take sumatriptan within 15 minutes of onset, then it resolves within an hour, otherwise it lasts all day.  They occur 2 to 3 days a week.  She also has a daily dull headache for which she takes Excedrin daily.     Also since 2020, she has had recurrent episodes of passing out.  She describes feeling hot and lightheaded with heaviness in her legs and then passes out.  It lasts only a few seconds.  No convulsions, incontinence, tongue/cheek biting.  No postictal confusion.  It occurs 2-3 times a day..  Sometimes preceded briefly by sharp severe headache.  It mostly occurs with change in position, such as bending over, but some are spontaneous.  She has been diagnosed with orthostatic hypotension.  She feels lightheaded when she stands up from sitting.  Unclear if related to polypharmacy.  She has been referred cardiology.  TTE unremarkable with normal EF and no valvular abnormalities.  She was ordered a 14 day ZIO patch but only tolerated 4 days due to skin irritation.  Heart rate demonstrated 43-173bpm with average 93 bpm.  Rare PVCs and PACs.  Events corresponded to sinus rhythm or  sinus tachycardia but no SVT.  Cardiology did not suspect POTS, Marfan's or Ehlers Danlos Syndrome.  Due to hitting her head from a fall, she had a CT of head of head without contrast on 08/29/2020 personally reviewed which was slightly limited by streak artifact from metal portions of hair extension but appeared normal.   MRI of brain with and without contrast on 01/18/2021 was normal.  Routine EEG on 01/10/2021 was normal.  48 hour ambulatory EEG in April 2022 was normal.  Captured 3 habitual events without electrographic correlate.    She is treated by Sports Medicine for her chronic back pain.  She has anterolisthesis of L5-S1 due to bilateral pars defects, causing left lateral recess stenosis.  Underwent epidural injection at L5-S1 in July 2024.  She reports episodes of uncontrolled shaking of the leg.  Two of these episodes occurred the previous month in June, prior to the epidural injection.  In June, she had a nasal procedure and it occurred while sitting.  It was thought that it may have been side effect to the epinephrine.  Another time,  it occurred at the gym while she was using a light barbell to perform behind the neck press.  Both legs started to shake.  In July, both legs started shaking while she was sitting.  Later, she was getting a pedicure when her right leg started to shake while her foot was being treated.  It is not just postural or occurs when bearing weight.  It happened while sitting relaxed as well.  No associated pain.       Past NSAIDS/analgesics:  Norco, Excedrin  Past abortive triptans:  sumatriptan tab, rizatriptan 10mg  Past muscle relaxants:  baclofen Past anti-emetic:  promethazine Past antihypertensive medications:  atenolol, metoprolol Past antidepressant medications:  amitriptyline Past anticonvulsant medications:  Topiramate, Lyrica, Keppra (for neuralgia) Past CGRP inhibitor:  Qulipta (made headaches worse), Aimovig 140mg , Emgality Other past therapies:  prednisone,  Botox (one round)     Family history of headache:  Father (migraines), mother (occasional headaches)  PAST MEDICAL HISTORY: Past Medical History:  Diagnosis Date   Anal pain    Anxiety    Arthritis    Complex regional pain syndrome of both lower extremities    per pt w/ right thigh numbness   Depression    Ehlers-Danlos disease    Exercise-induced asthma    Fainting episodes    Fibromyalgia    GERD (gastroesophageal reflux disease)    History of concussion 04/2019   per pt no residual   History of Osgood-Schlatter disease    History of syncope    ED visit 08-29-2020 w/ cp, palpitations--- pt sent for cardiology evaluation , dr Lalla Brothers, office note 09-07-2020 dx orthostatic intolerence ;   echo in epic 09-14-2020 , normal;  event monitor 09-13-2020 no signfiance arrhythmia , SR/ST;  CT angio chest 09-02-2020  normal   Irritable bowel    Migraine without aura    Orthostatic hypotension    POTS (postural orthostatic tachycardia syndrome)    Sacral radiculopathy    Spondylolisthesis, lumbosacral region    L5--S1   Wears contact lenses     MEDICATIONS: Current Outpatient Medications on File Prior to Visit  Medication Sig Dispense Refill   albuterol (PROVENTIL HFA;VENTOLIN HFA) 108 (90 Base) MCG/ACT inhaler Inhale 2 puffs into the lungs every 6 (six) hours as needed for wheezing or shortness of breath. 1 Inhaler 3   B Complex-C (SUPER B COMPLEX PO) Take by mouth.     bisoprolol (ZEBETA) 5 MG tablet TAKE 0.5 TABLETS (2.5 MG TOTAL) BY MOUTH AT BEDTIME. 45 tablet 0   buPROPion (WELLBUTRIN XL) 300 MG 24 hr tablet Take 1 tablet by mouth daily.     calcium carbonate (TUMS - DOSED IN MG ELEMENTAL CALCIUM) 500 MG chewable tablet Chew 1 tablet by mouth as needed for indigestion or heartburn.     Cholecalciferol 50 MCG (2000 UT) CAPS Take by mouth.     diclofenac (CATAFLAM) 50 MG tablet Take 1 tablet (50 mg total) by mouth 3 (three) times daily as needed. 90 tablet 5   dicyclomine  (BENTYL) 20 MG tablet TAKE 1 TABLET EVERY 6 HOURS AS NEEDED FOR ABDOMINAL PAIN     DULoxetine (CYMBALTA) 60 MG capsule TAKE 2 CAPSULES BY MOUTH EVERY DAY 180 capsule 1   eletriptan (RELPAX) 40 MG tablet Take 1 tablet (40 mg total) by mouth as needed for migraine or headache. May repeat in 2 hours if headache persists or recurs.  Maximum 2 tablets in 24 hours. 10 tablet 5   EPINEPHrine 0.3 mg/0.3 mL IJ  SOAJ injection Inject into the muscle.     estradiol (ESTRACE VAGINAL) 0.1 MG/GM vaginal cream Place 0.5 g vaginally at bedtime. Initial dose: nightly x 2 weeks then twice weekly 42.5 g 0   gabapentin (NEURONTIN) 100 MG capsule Take 1 capsule at bedtime for one week, then increase to 1 capsule twice daily. 60 capsule 0   ipratropium (ATROVENT) 0.03 % nasal spray SMARTSIG:1 Spray(s) Both Nares 1-3 Times Daily PRN     ketoconazole (NIZORAL) 2 % shampoo Apply 1 Application topically 3 (three) times a week.     meloxicam (MOBIC) 7.5 MG tablet Take 1 tablet (7.5 mg total) by mouth daily. 5 tablet 0   Multiple Vitamin (MULTIVITAMIN) tablet Take 1 tablet by mouth daily.     Norethindrone Acet-Ethinyl Est (JUNEL 1/20 PO)      norethindrone-ethinyl estradiol (JUNEL 1/20) 1-20 MG-MCG tablet TAKE 1 TABLET BY MOUTH DAILY. TAKE CONTINUOUSLY, SKIP PLACEBO WEEK 112 tablet 3   Omega-3 Fatty Acids (FISH OIL) 1200 MG CAPS Take by mouth.     ondansetron (ZOFRAN-ODT) 4 MG disintegrating tablet Take 1 tablet (4 mg total) by mouth every 6 (six) hours as needed for nausea or vomiting. 15 tablet 0   ondansetron (ZOFRAN-ODT) 8 MG disintegrating tablet Take 1 tablet (8 mg total) by mouth every 8 (eight) hours as needed for nausea or vomiting. 20 tablet 5   pramipexole (MIRAPEX) 0.5 MG tablet Take 0.5 mg by mouth at bedtime.     pramipexole (MIRAPEX) 1 MG tablet 1 TAB(S) ORALLY DAILY AT NIGHT 90 DAYS     promethazine (PHENERGAN) 25 MG tablet Take 1 tablet (25 mg total) by mouth every 6 (six) hours as needed for nausea or  vomiting. 60 tablet 5   temazepam (RESTORIL) 15 MG capsule TAKE 1 CAPSULE BY MOUTH EVERY DAY AT BEDTIME     tiZANidine (ZANAFLEX) 4 MG tablet TAKE 1/2 TO 1 TABLET BY MOUTH 2 TIMES DAILY AS NEEDED FOR MUSCLE SPASMS. 180 tablet 1   traZODone (DESYREL) 50 MG tablet TAKE 1 TABLET BY MOUTH EVERY DAY FOR 90 DAYS     ZEPBOUND 2.5 MG/0.5ML Pen 0.5 ML SUBCUTANEOUSLY ONCE A WEEK 28 DAYS     ZEPBOUND 5 MG/0.5ML Pen Inject 5 mg into the skin once a week. Every other week     Current Facility-Administered Medications on File Prior to Visit  Medication Dose Route Frequency Provider Last Rate Last Admin   0.9 %  sodium chloride infusion  500 mL Intravenous Once Armbruster, Willaim Rayas, MD        ALLERGIES: Allergies  Allergen Reactions   Gluten Meal     Other reaction(s): GI intolerance Vomiting   Pecan Extract Anaphylaxis and Hives   Strawberry Extract Anaphylaxis and Hives   Beef Allergy Hives    Other reaction(s): GI intolerance RED MEAT   Other Hives    Red Meat  and chicken   Shellfish Allergy Hives    lobster Other reaction(s): Other lobster lobster  lobster Other reaction(s): Other lobster    FAMILY HISTORY: Family History  Problem Relation Age of Onset   Arthritis Mother    Asthma Mother    Miscarriages / [place] Mother    Migraines Mother    COPD Father        per pt's mother-mild   Hypertension Father    Migraines Father    Asthma Brother    Migraines Brother    Anxiety disorder Brother    Arthritis Maternal Grandmother  Cancer Maternal Grandmother    Miscarriages / Stillbirths Maternal Grandmother    Diabetes Maternal Grandfather    Heart attack Maternal Grandfather    Heart disease Maternal Grandfather    Hyperlipidemia Maternal Grandfather    Hypertension Maternal Grandfather    Stroke Maternal Grandfather    Colon polyps Maternal Grandfather    Cancer Paternal Grandmother    Alcohol abuse Paternal Grandfather    Cancer Paternal Grandfather    COPD  Paternal Grandfather    Heart attack Paternal Grandfather    Heart disease Paternal Grandfather    Hyperlipidemia Paternal Grandfather    Hypertension Paternal Grandfather    Asthma Brother    OCD Brother    Anxiety disorder Brother    Seizures Neg Hx    Depression Neg Hx    Bipolar disorder Neg Hx    Schizophrenia Neg Hx    ADD / ADHD Neg Hx    Autism Neg Hx    Colon cancer Neg Hx    Esophageal cancer Neg Hx    Rectal cancer Neg Hx    Stomach cancer Neg Hx       Objective:  Blood pressure 103/69, pulse 85, resp. rate 20, height 5\' 7"  (1.702 m), weight 120 lb (54.4 kg), SpO2 96%. General: No acute distress.  Patient appears well-groomed.   Head:  Normocephalic/atraumatic Neck:  Supple.  Left greater than right paraspinal tenderness.  Full range of motion. Heart:  Regular rate and rhythm. Vascular:  No carotid bruits Neuro:  alert and oriented.  Speech fluent and not dysarthric, language intact.  CN II-XII intact. Bulk and tone normal, muscle strength 5/5 throughout.  Sensation to pinprick and vibration intact.  Deep tendon reflexes 2+ throughout toes downgoing.  Finger to nose testing intact.  Gait normal, Romberg negative.     Shon Millet, DO  CC: Veatrice Kells, NP

## 2024-01-14 ENCOUNTER — Encounter: Payer: Self-pay | Admitting: Neurology

## 2024-01-14 ENCOUNTER — Ambulatory Visit (INDEPENDENT_AMBULATORY_CARE_PROVIDER_SITE_OTHER): Admitting: Neurology

## 2024-01-14 ENCOUNTER — Telehealth: Payer: Self-pay | Admitting: Pharmacy Technician

## 2024-01-14 VITALS — BP 103/69 | HR 85 | Resp 20 | Ht 67.0 in | Wt 120.0 lb

## 2024-01-14 DIAGNOSIS — G43111 Migraine with aura, intractable, with status migrainosus: Secondary | ICD-10-CM | POA: Insufficient documentation

## 2024-01-14 MED ORDER — GABAPENTIN 100 MG PO CAPS: ORAL_CAPSULE | ORAL | 0 refills | Status: AC

## 2024-01-14 MED ORDER — PREDNISONE 10 MG PO TABS
ORAL_TABLET | ORAL | 0 refills | Status: AC
Start: 2024-01-14 — End: ?

## 2024-01-14 MED ORDER — ELETRIPTAN HYDROBROMIDE 40 MG PO TABS: 40.0000 mg | ORAL_TABLET | ORAL | 5 refills | Status: AC | PRN

## 2024-01-14 NOTE — Telephone Encounter (Addendum)
 Kiara Brewer, Patient has been approved and will be scheduled as soon as possible.  Auth Submission: APPROVED Site of care: Site of care: [facility] Payer: [employer] Medication & CPT/J Code(s) submitted: Vyepti (Eptinezumab) [id number] Route of submission (phone, fax, portal): PORTAL Phone # Fax # Auth type: Buy/Bill PB Units/visits requested: 100MG  [id number] Reference number: [id number] Approval from: 01/14/24 to 01/13/25     Co-pay card: approved

## 2024-01-14 NOTE — Patient Instructions (Addendum)
 PlanPlan to start Vyepti infusion every 3 months Start gabapentin 100mg  twice daily increasing to 300mg  twice daily Take eletriptan as needed for severe headaches.  Also may try Bernita Raisin (take 1 tablet at earliest onset of migraine, may repeat after 2 hours - maximum 2 tablets in 24 hours) Take Zofran-ODT 8mg  as needed Limit use of pain relievers to no more than 2 days out of week to prevent risk of rebound or medication-overuse headache. Keep headache diary Follow up 4 months.

## 2024-01-19 ENCOUNTER — Other Ambulatory Visit: Payer: Self-pay

## 2024-01-19 ENCOUNTER — Encounter: Payer: Self-pay | Admitting: Neurology

## 2024-01-19 DIAGNOSIS — G43019 Migraine without aura, intractable, without status migrainosus: Secondary | ICD-10-CM

## 2024-01-20 ENCOUNTER — Telehealth: Payer: Self-pay | Admitting: Neurology

## 2024-01-20 NOTE — Telephone Encounter (Signed)
 Caller states that she is not able to get in to get MRI until 02-08-24 this includes all locations. She states that she can not wait that long and was advise to call out office and let  us know  to see where else this can be ordered

## 2024-01-20 NOTE — Telephone Encounter (Signed)
 Per patient she was able to get a CT of the head scheduled for Tomorrow.  Please advise if it okay.

## 2024-01-21 NOTE — Telephone Encounter (Signed)
 Per Dr.jaffe  That is okay    Patient advised Via mychart message.

## 2024-01-23 ENCOUNTER — Other Ambulatory Visit: Payer: Self-pay | Admitting: Neurology

## 2024-01-23 DIAGNOSIS — G43019 Migraine without aura, intractable, without status migrainosus: Secondary | ICD-10-CM

## 2024-01-25 ENCOUNTER — Ambulatory Visit (INDEPENDENT_AMBULATORY_CARE_PROVIDER_SITE_OTHER)

## 2024-01-25 VITALS — BP 96/62 | HR 98 | Temp 98.5°F | Resp 16 | Ht 68.0 in | Wt 120.8 lb

## 2024-01-25 DIAGNOSIS — G43111 Migraine with aura, intractable, with status migrainosus: Secondary | ICD-10-CM | POA: Diagnosis not present

## 2024-01-25 MED ORDER — EPTINEZUMAB-JJMR 100 MG/ML IV SOLN
100.0000 mg | Freq: Once | INTRAVENOUS | Status: AC
  Administered 2024-01-25: 100 mg via INTRAVENOUS
  Filled 2024-01-25: qty 1

## 2024-01-25 NOTE — Progress Notes (Signed)
 Diagnosis: Migraines  Provider:  Chilton Greathouse MD  Procedure: IV Infusion  IV Type: Peripheral, IV Location: R Antecubital  Vyepti (Eptinezumab-jjmr), Dose: 100 mg  Infusion Start Time: 1536  Infusion Stop Time: 1609  Post Infusion IV Care: Observation period completed and Peripheral IV Discontinued  Discharge: Condition: Good, Destination: Home . AVS Provided  Performed by:  Nat Math, RN

## 2024-01-26 ENCOUNTER — Ambulatory Visit

## 2024-01-28 NOTE — Telephone Encounter (Signed)
 Pt called in and left a message. She stated she called [facility] and they do not have anything on their end. She wants our office to call [facility] and get it scheduled for her

## 2024-01-30 ENCOUNTER — Encounter: Payer: Self-pay | Admitting: Neurology

## 2024-02-03 ENCOUNTER — Telehealth: Payer: Self-pay | Admitting: Neurology

## 2024-02-03 ENCOUNTER — Other Ambulatory Visit: Payer: Self-pay | Admitting: Neurology

## 2024-02-03 MED ORDER — NURTEC 75 MG PO TBDP: 1.0000 | ORAL_TABLET | ORAL | 5 refills | Status: AC

## 2024-02-03 NOTE — Telephone Encounter (Signed)
 Pt. Would like a call back to speak to someone about MRI results

## 2024-02-03 NOTE — Telephone Encounter (Signed)
 Called pt and informed her the result were not back at this time. She has it done at [facility] and it was need to be sent to [place]. She is going by there and pick it up and bring it back to our office.

## 2024-02-05 ENCOUNTER — Telehealth: Payer: Self-pay | Admitting: Pharmacy Technician

## 2024-02-05 ENCOUNTER — Other Ambulatory Visit (HOSPITAL_COMMUNITY): Payer: Self-pay

## 2024-02-05 ENCOUNTER — Other Ambulatory Visit

## 2024-02-05 ENCOUNTER — Encounter: Payer: Self-pay | Admitting: Neurology

## 2024-02-05 NOTE — Telephone Encounter (Signed)
 Pharmacy Patient Advocate Encounter  Received notification from [employer] that Prior Authorization for NURTEC 75MG  has been APPROVED from 3.12.25 to 4.11.26. Ran test claim, Copay is $0. This test claim was processed through [facility]- copay amounts may vary at other pharmacies due to pharmacy/plan contracts, or as the patient moves through the different stages of their insurance plan.   PA #/Case ID/Reference #: [id number]

## 2024-02-05 NOTE — Telephone Encounter (Signed)
 Pharmacy Patient Advocate Encounter   Received notification from CoverMyMeds that prior authorization for NURTEC 75MG  is required/requested.   Insurance verification completed.   The patient is insured through [employer] .   Per test claim: PA required; PA submitted to above mentioned insurance via CoverMyMeds Key/confirmation #/EOC [id number] Status is pending

## 2024-02-17 NOTE — Progress Notes (Deleted)
 [facility] [address] [place] [address] Phone: [phone] Subjective:    I'm seeing this patient by the request  of:  Cox, Burna Carrier, NP  CC:   [id number]  12/22/2023 Does have a connective tissue disorder and did increase the likelihood of patient having some of these injuries.  We discussed bracing at night, for the thumb.  Discussed home exercises and icing regimen.  Discussed with patient to continue to stay active otherwise.  Would like to get laboratory workup with patient having some hypercalcemia previously as well as last time patient had labs checked did have an elevation in LFTs.  Patient is going to try this and see how she responds.  Discussed which activities to do and which ones to avoid.  Increase activity slowly.  Follow-up again in 6 to 8 weeks otherwise.     Will recheck to see to make sure her calcium has come down      Update 02/18/2024 Kiara Brewer is a 23 y.o. female coming in with complaint of EDS. Patient states       Past Medical History:  Diagnosis Date   Anal pain    Anxiety    Arthritis    Complex regional pain syndrome of both lower extremities    per pt w/ right thigh numbness   Depression    Ehlers-Danlos disease    Exercise-induced asthma    Fainting episodes    Fibromyalgia    GERD (gastroesophageal reflux disease)    History of concussion 04/2019   per pt no residual   History of Osgood-Schlatter disease    History of syncope    ED visit 08-29-2020 w/ cp, palpitations--- pt sent for cardiology evaluation , dr Marven Slimmer, office note 09-07-2020 dx orthostatic intolerence ;   echo in epic 09-14-2020 , normal;  event monitor 09-13-2020 no signfiance arrhythmia , SR/ST;  CT angio chest 09-02-2020  normal   Irritable bowel    Migraine without aura    Orthostatic hypotension    POTS (postural orthostatic tachycardia syndrome)    Sacral radiculopathy    Spondylolisthesis, lumbosacral region     L5--S1   Wears contact lenses    Past Surgical History:  Procedure Laterality Date   RECTAL EXAM UNDER ANESTHESIA N/A 11/22/2020   Procedure: ANAL  EXAM UNDER ANESTHESIA;  Surgeon: Joyce Nixon, MD;  Location: [facility];  Service: General;  Laterality: N/A;   SPHINCTEROTOMY N/A 11/22/2020   Procedure: CHEMICAL SPHINCTEROTOMY/ FISSURECTOMY;  Surgeon: Joyce Nixon, MD;  Location: [facility];  Service: General;  Laterality: N/A;   TONSILLECTOMY AND ADENOIDECTOMY  2010   WISDOM TOOTH EXTRACTION  2020   Social History   Socioeconomic History   Marital status: Married    Spouse name: Not on file   Number of children: Not on file   Years of education: Not on file   Highest education level: Not on file  Occupational History   Not on file  Tobacco Use   Smoking status: Never   Smokeless tobacco: Never  Vaping Use   Vaping status: Never Used  Substance and Sexual Activity   Alcohol use: Never   Drug use: Never   Sexual activity: Yes    Partners: Male    Birth control/protection: Pill  Other Topics Concern   Not on file  Social History Narrative   [name] is in the 12th grade at [employer]; she does well academically. She lives with her parents. She  enjoys going to the gym, boxing, and playing with animals   Right handed   Drinks caffeine   An apartment one story    Social Drivers of Health   Financial Resource Strain: Low Risk  (07/11/2022)   Received from [facility], [facility]   Overall Financial Resource Strain (CARDIA)    Difficulty of Paying Living Expenses: Not hard at all  Food Insecurity: No Food Insecurity (08/09/2022)   Hunger Vital Sign    Worried About Running Out of Food in the Last Year: Never true    Ran Out of Food in the Last Year: Never true  Transportation Needs: No Transportation Needs (08/09/2022)   PRAPARE - Administrator, Civil Service (Medical): No    Lack of Transportation (Non-Medical): No  Recent Concern:  Transportation Needs - Unmet Transportation Needs (07/11/2022)   Received from [facility], [facility]   [facility] - [facility]    Lack of Transportation (Medical): No    Lack of Transportation (Non-Medical): Yes  Physical Activity: Sufficiently Active (07/11/2022)   Received from [facility], [facility]   Exercise Vital Sign    Days of Exercise per Week: 5 days    Minutes of Exercise per Session: 40 min  Stress: Not on file  Social Connections: Unknown (02/27/2022)   Received from [facility], [facility]   Social Network    Social Network: Not on file   Allergies  Allergen Reactions   Gluten Meal     Other reaction(s): GI intolerance Vomiting   Pecan Extract Anaphylaxis and Hives   Strawberry Extract Anaphylaxis and Hives   Beef Allergy Hives    Other reaction(s): GI intolerance RED MEAT   Other Hives    Red Meat  and chicken   Shellfish Allergy Hives    lobster Other reaction(s): Other lobster lobster  lobster Other reaction(s): Other lobster   Family History  Problem Relation Age of Onset   Arthritis Mother    Asthma Mother    Miscarriages / [place] Mother    Migraines Mother    COPD Father        per pt's mother-mild   Hypertension Father    Migraines Father    Asthma Brother    Migraines Brother    Anxiety disorder Brother    Arthritis Maternal Grandmother    Cancer Maternal Grandmother    Miscarriages / Stillbirths Maternal Grandmother    Diabetes Maternal Grandfather    Heart attack Maternal Grandfather    Heart disease Maternal Grandfather    Hyperlipidemia Maternal Grandfather    Hypertension Maternal Grandfather    Stroke Maternal Grandfather    Colon polyps Maternal Grandfather    Cancer Paternal Grandmother    Alcohol abuse Paternal Grandfather    Cancer Paternal Grandfather    COPD Paternal Grandfather    Heart attack Paternal Grandfather    Heart disease Paternal Grandfather    Hyperlipidemia Paternal Grandfather    Hypertension  Paternal Grandfather    Asthma Brother    OCD Brother    Anxiety disorder Brother    Seizures Neg Hx    Depression Neg Hx    Bipolar disorder Neg Hx    Schizophrenia Neg Hx    ADD / ADHD Neg Hx    Autism Neg Hx    Colon cancer Neg Hx    Esophageal cancer Neg Hx    Rectal cancer Neg Hx    Stomach cancer Neg Hx     Current Outpatient  Medications (Endocrine & Metabolic):    Norethindrone  Acet-Ethinyl Est (JUNEL 1/20 PO),    norethindrone -ethinyl estradiol  (JUNEL 1/20) 1-20 MG-MCG tablet, TAKE 1 TABLET BY MOUTH DAILY. TAKE CONTINUOUSLY, SKIP PLACEBO WEEK   predniSONE  (DELTASONE ) 10 MG tablet, Take 60mg  on day 1, then 50mg  on day 2, then 40mg  on day 3, then 30mg  on day 4, then 20mg  on day 5, then 10mg  on day 6.   Current Outpatient Medications (Cardiovascular):    bisoprolol  (ZEBETA ) 5 MG tablet, TAKE 0.5 TABLETS (2.5 MG TOTAL) BY MOUTH AT BEDTIME.   EPINEPHrine  0.3 mg/0.3 mL IJ SOAJ injection, Inject into the muscle.   Current Outpatient Medications (Respiratory):    albuterol  (PROVENTIL  HFA;VENTOLIN  HFA) 108 (90 Base) MCG/ACT inhaler, Inhale 2 puffs into the lungs every 6 (six) hours as needed for wheezing or shortness of breath.   ipratropium (ATROVENT) 0.03 % nasal spray, SMARTSIG:1 Spray(s) Both Nares 1-3 Times Daily PRN   promethazine  (PHENERGAN ) 25 MG tablet, Take 1 tablet (25 mg total) by mouth every 6 (six) hours as needed for nausea or vomiting.   Current Outpatient Medications (Analgesics):    diclofenac  (CATAFLAM ) 50 MG tablet, Take 1 tablet (50 mg total) by mouth 3 (three) times daily as needed.   eletriptan  (RELPAX ) 40 MG tablet, Take 1 tablet (40 mg total) by mouth as needed for migraine or headache. May repeat in 2 hours if headache persists or recurs.  Maximum 2 tablets in 24 hours.   meloxicam  (MOBIC ) 7.5 MG tablet, Take 1 tablet (7.5 mg total) by mouth daily.   Rimegepant Sulfate (NURTEC) 75 MG TBDP, Take 1 tablet (75 mg total) by mouth every other  day.     Current Outpatient Medications (Other):    B Complex-C (SUPER B COMPLEX PO), Take by mouth.   buPROPion  (WELLBUTRIN  XL) 300 MG 24 hr tablet, Take 1 tablet by mouth daily.   calcium carbonate (TUMS - DOSED IN MG ELEMENTAL CALCIUM) 500 MG chewable tablet, Chew 1 tablet by mouth as needed for indigestion or heartburn.   Cholecalciferol 50 MCG (2000 UT) CAPS, Take by mouth.   dicyclomine  (BENTYL ) 20 MG tablet, TAKE 1 TABLET EVERY 6 HOURS AS NEEDED FOR ABDOMINAL PAIN   DULoxetine  (CYMBALTA ) 60 MG capsule, TAKE 2 CAPSULES BY MOUTH EVERY DAY   estradiol  (ESTRACE  VAGINAL) 0.1 MG/GM vaginal cream, Place 0.5 g vaginally at bedtime. Initial dose: nightly x 2 weeks then twice weekly   gabapentin  (NEURONTIN ) 100 MG capsule, Take 1 capsule twice daily for one week, then 2 capsules twice daily for one week, then 3 capsules twice daily   ketoconazole (NIZORAL) 2 % shampoo, Apply 1 Application topically 3 (three) times a week.   Multiple Vitamin (MULTIVITAMIN) tablet, Take 1 tablet by mouth daily.   Omega-3 Fatty Acids (FISH OIL) 1200 MG CAPS, Take by mouth.   ondansetron  (ZOFRAN -ODT) 8 MG disintegrating tablet, Take 1 tablet (8 mg total) by mouth every 8 (eight) hours as needed for nausea or vomiting.   pramipexole (MIRAPEX) 0.5 MG tablet, Take 0.5 mg by mouth at bedtime.   pramipexole (MIRAPEX) 1 MG tablet, 1 TAB(S) ORALLY DAILY AT NIGHT 90 DAYS   temazepam (RESTORIL) 15 MG capsule, TAKE 1 CAPSULE BY MOUTH EVERY DAY AT BEDTIME   tiZANidine  (ZANAFLEX ) 4 MG tablet, TAKE 1/2 TO 1 TABLET BY MOUTH 2 TIMES DAILY AS NEEDED FOR MUSCLE SPASMS.   traZODone (DESYREL) 50 MG tablet, TAKE 1 TABLET BY MOUTH EVERY DAY FOR 90 DAYS   ZEPBOUND 2.5 MG/0.5ML  Pen, 0.5 ML SUBCUTANEOUSLY ONCE A WEEK 28 DAYS   ZEPBOUND 5 MG/0.5ML Pen, Inject 5 mg into the skin once a week. Every other week  Current Facility-Administered Medications (Other):    0.9 %  sodium chloride  infusion   Reviewed prior external information  including notes and imaging from  primary care provider As well as notes that were available from care everywhere and other healthcare systems.  Past medical history, social, surgical and family history all reviewed in electronic medical record.  No pertanent information unless stated regarding to the chief complaint.   Review of Systems:  No headache, visual changes, nausea, vomiting, diarrhea, constipation, dizziness, abdominal pain, skin rash, fevers, chills, night sweats, weight loss, swollen lymph nodes, body aches, joint swelling, chest pain, shortness of breath, mood changes. POSITIVE muscle aches  Objective  There were no vitals taken for this visit.   General: No apparent distress alert and oriented x3 mood and affect normal, dressed appropriately.  HEENT: Pupils equal, extraocular movements intact  Respiratory: Patient's speak in full sentences and does not appear short of breath  Cardiovascular: No lower extremity edema, non tender, no erythema      Impression and Recommendations:

## 2024-02-18 ENCOUNTER — Ambulatory Visit: Payer: 59 | Admitting: Family Medicine

## 2024-03-23 NOTE — Progress Notes (Unsigned)
 [facility] [address] [place] [address] Phone: [phone] Subjective:   IBryan Brewer, am serving as a scribe for Dr. Ronnell Coins.  I'm seeing this patient by the request  of:  Cox, Burna Carrier, NP  CC: Multiple complaints  [id number]  12/22/2023 Will recheck to see to make sure her calcium has come down.     Does have a connective tissue disorder and did increase the likelihood of patient having some of these injuries.  We discussed bracing at night, for the thumb.  Discussed home exercises and icing regimen.  Discussed with patient to continue to stay active otherwise.  Would like to get laboratory workup with patient having some hypercalcemia previously as well as last time patient had labs checked did have an elevation in LFTs.  Patient is going to try this and see how she responds.  Discussed which activities to do and which ones to avoid.  Increase activity slowly.  Follow-up again in 6 to 8 weeks otherwise.     Update 03/24/2024 Kiara Brewer is a 23 y.o. female coming in with complaint of R thumb pain and EDS. Patient states thumb is still popping out. Brace seems to help. Brain fog micro dose nicotine patch? Prism  glasses for eye issues, Depth perception problems, BVD?? Has been happening for years. Fatigue problems has tried narcolepsy meds, don't seem to be helping.      Past Medical History:  Diagnosis Date   Anal pain    Anxiety    Arthritis    Complex regional pain syndrome of both lower extremities    per pt w/ right thigh numbness   Depression    Ehlers-Danlos disease    Exercise-induced asthma    Fainting episodes    Fibromyalgia    GERD (gastroesophageal reflux disease)    History of concussion 04/2019   per pt no residual   History of Osgood-Schlatter disease    History of syncope    ED visit 08-29-2020 w/ cp, palpitations--- pt sent for cardiology evaluation , dr Marven Slimmer, office note 09-07-2020 dx orthostatic  intolerence ;   echo in epic 09-14-2020 , normal;  event monitor 09-13-2020 no signfiance arrhythmia , SR/ST;  CT angio chest 09-02-2020  normal   Irritable bowel    Migraine without aura    Orthostatic hypotension    POTS (postural orthostatic tachycardia syndrome)    Sacral radiculopathy    Spondylolisthesis, lumbosacral region    L5--S1   Wears contact lenses    Past Surgical History:  Procedure Laterality Date   RECTAL EXAM UNDER ANESTHESIA N/A 11/22/2020   Procedure: ANAL  EXAM UNDER ANESTHESIA;  Surgeon: Joyce Nixon, MD;  Location: [facility];  Service: General;  Laterality: N/A;   SPHINCTEROTOMY N/A 11/22/2020   Procedure: CHEMICAL SPHINCTEROTOMY/ FISSURECTOMY;  Surgeon: Joyce Nixon, MD;  Location: [facility];  Service: General;  Laterality: N/A;   TONSILLECTOMY AND ADENOIDECTOMY  2010   WISDOM TOOTH EXTRACTION  2020   Social History   Socioeconomic History   Marital status: Married    Spouse name: Not on file   Number of children: Not on file   Years of education: Not on file   Highest education level: Not on file  Occupational History   Not on file  Tobacco Use   Smoking status: Never   Smokeless tobacco: Never  Vaping Use   Vaping status: Never Used  Substance and Sexual Activity   Alcohol use:  Never   Drug use: Never   Sexual activity: Yes    Partners: Male    Birth control/protection: Pill  Other Topics Concern   Not on file  Social History Narrative   [name] is in the 12th grade at [employer]; she does well academically. She lives with her parents. She enjoys going to the gym, boxing, and playing with animals   Right handed   Drinks caffeine   An apartment one story    Social Drivers of Health   Financial Resource Strain: Low Risk  (07/11/2022)   Received from [facility], [facility]   Overall Financial Resource Strain (CARDIA)    Difficulty of Paying Living Expenses: Not hard at all  Food Insecurity: No Food Insecurity  (08/09/2022)   Hunger Vital Sign    Worried About Running Out of Food in the Last Year: Never true    Ran Out of Food in the Last Year: Never true  Transportation Needs: No Transportation Needs (08/09/2022)   PRAPARE - Administrator, Civil Service (Medical): No    Lack of Transportation (Non-Medical): No  Recent Concern: Transportation Needs - Unmet Transportation Needs (07/11/2022)   Received from [facility], [facility]   [facility] - Transportation    Lack of Transportation (Medical): No    Lack of Transportation (Non-Medical): Yes  Physical Activity: Sufficiently Active (07/11/2022)   Received from [facility], [facility]   Exercise Vital Sign    Days of Exercise per Week: 5 days    Minutes of Exercise per Session: 40 min  Stress: Not on file  Social Connections: Unknown (02/27/2022)   Received from [facility], [facility]   Social Network    Social Network: Not on file   Allergies  Allergen Reactions   Gluten Meal     Other reaction(s): GI intolerance Vomiting   Pecan Extract Anaphylaxis and Hives   Strawberry Extract Anaphylaxis and Hives   Beef Allergy Hives    Other reaction(s): GI intolerance RED MEAT   Other Hives    Red Meat  and chicken   Shellfish Allergy Hives    lobster Other reaction(s): Other lobster lobster  lobster Other reaction(s): Other lobster   Family History  Problem Relation Age of Onset   Arthritis Mother    Asthma Mother    Miscarriages / [place] Mother    Migraines Mother    COPD Father        per pt's mother-mild   Hypertension Father    Migraines Father    Asthma Brother    Migraines Brother    Anxiety disorder Brother    Arthritis Maternal Grandmother    Cancer Maternal Grandmother    Miscarriages / Stillbirths Maternal Grandmother    Diabetes Maternal Grandfather    Heart attack Maternal Grandfather    Heart disease Maternal Grandfather    Hyperlipidemia Maternal Grandfather    Hypertension Maternal  Grandfather    Stroke Maternal Grandfather    Colon polyps Maternal Grandfather    Cancer Paternal Grandmother    Alcohol abuse Paternal Grandfather    Cancer Paternal Grandfather    COPD Paternal Grandfather    Heart attack Paternal Grandfather    Heart disease Paternal Grandfather    Hyperlipidemia Paternal Grandfather    Hypertension Paternal Grandfather    Asthma Brother    OCD Brother    Anxiety disorder Brother    Seizures Neg Hx    Depression Neg Hx    Bipolar disorder Neg  Hx    Schizophrenia Neg Hx    ADD / ADHD Neg Hx    Autism Neg Hx    Colon cancer Neg Hx    Esophageal cancer Neg Hx    Rectal cancer Neg Hx    Stomach cancer Neg Hx     Current Outpatient Medications (Endocrine & Metabolic):    Norethindrone  Acet-Ethinyl Est (JUNEL 1/20 PO),    norethindrone -ethinyl estradiol  (JUNEL 1/20) 1-20 MG-MCG tablet, TAKE 1 TABLET BY MOUTH DAILY. TAKE CONTINUOUSLY, SKIP PLACEBO WEEK   predniSONE  (DELTASONE ) 10 MG tablet, Take 60mg  on day 1, then 50mg  on day 2, then 40mg  on day 3, then 30mg  on day 4, then 20mg  on day 5, then 10mg  on day 6.   Current Outpatient Medications (Cardiovascular):    bisoprolol  (ZEBETA ) 5 MG tablet, TAKE 0.5 TABLETS (2.5 MG TOTAL) BY MOUTH AT BEDTIME.   EPINEPHrine  0.3 mg/0.3 mL IJ SOAJ injection, Inject into the muscle.   Current Outpatient Medications (Respiratory):    albuterol  (PROVENTIL  HFA;VENTOLIN  HFA) 108 (90 Base) MCG/ACT inhaler, Inhale 2 puffs into the lungs every 6 (six) hours as needed for wheezing or shortness of breath.   ipratropium (ATROVENT) 0.03 % nasal spray, SMARTSIG:1 Spray(s) Both Nares 1-3 Times Daily PRN   promethazine  (PHENERGAN ) 25 MG tablet, Take 1 tablet (25 mg total) by mouth every 6 (six) hours as needed for nausea or vomiting.   Current Outpatient Medications (Analgesics):    diclofenac  (CATAFLAM ) 50 MG tablet, Take 1 tablet (50 mg total) by mouth 3 (three) times daily as needed.   eletriptan  (RELPAX ) 40 MG tablet,  Take 1 tablet (40 mg total) by mouth as needed for migraine or headache. May repeat in 2 hours if headache persists or recurs.  Maximum 2 tablets in 24 hours.   meloxicam  (MOBIC ) 7.5 MG tablet, Take 1 tablet (7.5 mg total) by mouth daily.   Rimegepant Sulfate (NURTEC) 75 MG TBDP, Take 1 tablet (75 mg total) by mouth every other day.     Current Outpatient Medications (Other):    B Complex-C (SUPER B COMPLEX PO), Take by mouth.   buPROPion  (WELLBUTRIN  XL) 300 MG 24 hr tablet, Take 1 tablet by mouth daily.   calcium carbonate (TUMS - DOSED IN MG ELEMENTAL CALCIUM) 500 MG chewable tablet, Chew 1 tablet by mouth as needed for indigestion or heartburn.   Cholecalciferol 50 MCG (2000 UT) CAPS, Take by mouth.   dicyclomine  (BENTYL ) 20 MG tablet, TAKE 1 TABLET EVERY 6 HOURS AS NEEDED FOR ABDOMINAL PAIN   DULoxetine  (CYMBALTA ) 60 MG capsule, TAKE 2 CAPSULES BY MOUTH EVERY DAY   estradiol  (ESTRACE  VAGINAL) 0.1 MG/GM vaginal cream, Place 0.5 g vaginally at bedtime. Initial dose: nightly x 2 weeks then twice weekly   gabapentin  (NEURONTIN ) 100 MG capsule, Take 1 capsule twice daily for one week, then 2 capsules twice daily for one week, then 3 capsules twice daily   ketoconazole (NIZORAL) 2 % shampoo, Apply 1 Application topically 3 (three) times a week.   Multiple Vitamin (MULTIVITAMIN) tablet, Take 1 tablet by mouth daily.   Omega-3 Fatty Acids (FISH OIL) 1200 MG CAPS, Take by mouth.   ondansetron  (ZOFRAN -ODT) 8 MG disintegrating tablet, Take 1 tablet (8 mg total) by mouth every 8 (eight) hours as needed for nausea or vomiting.   pramipexole (MIRAPEX) 0.5 MG tablet, Take 0.5 mg by mouth at bedtime.   pramipexole (MIRAPEX) 1 MG tablet, 1 TAB(S) ORALLY DAILY AT NIGHT 90 DAYS   temazepam (RESTORIL) 15  MG capsule, TAKE 1 CAPSULE BY MOUTH EVERY DAY AT BEDTIME   tiZANidine  (ZANAFLEX ) 4 MG tablet, TAKE 1/2 TO 1 TABLET BY MOUTH 2 TIMES DAILY AS NEEDED FOR MUSCLE SPASMS.   traZODone (DESYREL) 50 MG tablet, TAKE  1 TABLET BY MOUTH EVERY DAY FOR 90 DAYS   ZEPBOUND 2.5 MG/0.5ML Pen, 0.5 ML SUBCUTANEOUSLY ONCE A WEEK 28 DAYS   ZEPBOUND 5 MG/0.5ML Pen, Inject 5 mg into the skin once a week. Every other week  Current Facility-Administered Medications (Other):    0.9 %  sodium chloride  infusion   Reviewed prior external information including notes and imaging from  primary care provider As well as notes that were available from care everywhere and other healthcare systems.  Past medical history, social, surgical and family history all reviewed in electronic medical record.  No pertanent information unless stated regarding to the chief complaint.   Review of Systems:  No  visual changes, nausea, vomiting, diarrhea, constipation, dizziness, abdominal pain, skin rash, fevers, chills, night sweats, weight loss, swollen lymph nodes, body aches, joint swelling, chest pain, shortness of breath, mood changes. POSITIVE muscle aches, headache  Objective  Blood pressure 110/74, pulse 97, height 5\' 8"  (1.727 m), weight 124 lb (56.2 kg), SpO2 97%.   General: No apparent distress alert and oriented x3 mood and affect normal, dressed appropriately.  HEENT: Pupils equal, extraocular movements intact very mild horizontal nystagmus noted. Respiratory: Patient's speak in full sentences and does not appear short of breath  Cardiovascular: No lower extremity edema, non tender, no erythema  Low back exam shows does sit relatively comfortably.  Right hand exam shows instability noted with what appears to be potential gapping of the UCL of the thumb on the right side.  Definitely has even more movement than the contralateral side.  Mild swelling noted of the proximal IP joint.    Impression and Recommendations:     The above documentation has been reviewed and is accurate and complete Elissia Spiewak M Tierney Behl, DO

## 2024-03-24 ENCOUNTER — Telehealth: Payer: Self-pay | Admitting: Family Medicine

## 2024-03-24 ENCOUNTER — Ambulatory Visit: Admitting: Family Medicine

## 2024-03-24 VITALS — BP 110/74 | HR 97 | Ht 68.0 in | Wt 124.0 lb

## 2024-03-24 DIAGNOSIS — G8929 Other chronic pain: Secondary | ICD-10-CM | POA: Diagnosis not present

## 2024-03-24 DIAGNOSIS — G9332 Myalgic encephalomyelitis/chronic fatigue syndrome: Secondary | ICD-10-CM | POA: Diagnosis not present

## 2024-03-24 DIAGNOSIS — M79644 Pain in right finger(s): Secondary | ICD-10-CM | POA: Diagnosis not present

## 2024-03-24 DIAGNOSIS — H539 Unspecified visual disturbance: Secondary | ICD-10-CM | POA: Diagnosis not present

## 2024-03-24 NOTE — Patient Instructions (Addendum)
 [facility] [phone] Call Today  When we receive your results we will contact you.  Decrease Cymbalta  to 1 pill daily, see if in 5 days that decrease fatigue Don't see any evidence on nicotine patch See you again in 2-3 months

## 2024-03-24 NOTE — Telephone Encounter (Signed)
 Savannah with [facility] called to ask for notes from the most recent visit for a little bit of an understanding of why Dr. Felipe Horton wants her screened for binocular vision disorder. They would like to have some notes for that. Please advise. They provided their phone number and fax number.

## 2024-03-24 NOTE — Telephone Encounter (Signed)
Note faxed to provider.

## 2024-03-24 NOTE — Assessment & Plan Note (Signed)
 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  Will get an MRI of the hand and thumb to further evaluate.  Will use arthrogram technique as well to further evaluate the UCL and what would be the next step.  Discussed with patient icing regimen and home exercises otherwise.  Increase activity slowly.  Follow-up again in 6 to 8 weeks.

## 2024-03-24 NOTE — Assessment & Plan Note (Signed)
 Past medical history significant for hypermobility and I do think that she should be scanned for other things at the moment.  Has had difficulty with depth perception sometimes she states.  Patient will be referred appropriately for further evaluation.

## 2024-03-24 NOTE — Addendum Note (Signed)
 Addended by: Alen Husbands on: 03/24/2024 02:44 PM   Modules accepted: Orders

## 2024-03-24 NOTE — Assessment & Plan Note (Signed)
 Continuing to have chronic fatigue.  Discussed with her as well as her mother about this.  They did discuss the potential for nicotine patches but did not find any significant good resources for them.  We discussed the possibility of caffeine or some other medications.  Patient could be having a paradoxical effect to be increasing in Cymbalta  and discussed taking 1 pill daily to see if that would be beneficial.  Highly difficult with patient's amount of medications as well as comorbidities.  Will have to see how patient responds to this.  Follow-up with me again in 6 to 8 weeks otherwise.  Total time reviewing patient's chart and discussing with patient and her mother 31 minutes

## 2024-03-31 ENCOUNTER — Other Ambulatory Visit: Payer: Self-pay | Admitting: Physical Medicine and Rehabilitation

## 2024-03-31 ENCOUNTER — Encounter: Payer: Self-pay | Admitting: Family Medicine

## 2024-04-04 NOTE — Progress Notes (Deleted)
 NEUROLOGY FOLLOW UP OFFICE NOTE  Kiara Brewer [id number]  Assessment/Plan:   Migraine with aura, with status migrainosus, intractable   In attempt to break intractable migraine, prednisone  taper Migraine prevention:  Plan to start Vyepti  100mg  every 3 months. To address neck pain, will restart gabapentin  titrating to 300mg  twice daily Migraine rescue:  Eletriptan  40mg  (advised to retry).  Also provided samples of Ubrelvy 100mg .  Zofran -ODT 8mg . Limit use of pain relievers to no more than 2 days out of week to prevent risk of rebound or medication-overuse headache. Keep headache diary Follow up 4 months.    Subjective:  Kiara Brewer is an 23 year old right-handed female with Ehlers Danlos syndrome (diagnosed at [facility]), POTS (diagnosed at [facility]), fibromyalgia and migraine who follows up for migraine She is accompanied by her mother who supplements history.  MRI of brain personally reviewed.   UPDATE: In March 2025, she developed a persistent headache following the flu.  Endorsed a holocephalic or unilateral (either side) headache with neck pain/stiffness, facial pain, nausea and vomiting.  Neck pain aggravated with head turn to the left.  Pain fluctuates from moderate to severe daily.  Eletriptan  ineffective but only tried once.  She had a neck massage which helped a little bit.  She has been to the Urgent Care twice where she received IVF and migraine headache cocktail which provided some relief for only a day.  Prescribed a prednisone  taper, which was ineffective.  Due to worsening headaches, she had a head CT on 01/21/2024 which showed no acute intracranial abnormality.  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  Bearl Botts on Vyepti  ***  Ubrelvy samples *** Nurtec  Current NSAIDS/analgesics:  Excedrin Migraine, diclofenac  50mg , meloxicam  Current triptans:  eletriptan  40mg  Current anti-emetic:  Zofran  ODT 8mg  Current muscle relaxants:  Tizanidine  2-4mg  BID PRN Current Antihypertensive medications:  bisoprolol  Current Antidepressant medications:  Cymbalta  60mg  daily, Wellbutrin  XL 300mg  daily Current Anticonvulsant medications:  gabapentin  100mg  BID Current CGRP inhibitor: none Current supplements:  melatonin Birth control:  Junel 1/20   Caffeine:  Celcius energy drink Diet: Drinks a lot of water and Gatorade.  Snacks throughout day rather than large meals.   Exercise:  Lift weights 6 times a week. . Sometimes running Depression:  yes; Anxiety:  yes Sleep hygiene:  Falls asleep but trouble staying asleep. Other pain:  Reports worsening right lower leg pain and numbness (complex regional pain syndrome)   HISTORY:  Patient has complex medical history.   When she was in middle school, she fell on the ice landing on her right leg.  It didn't seem too bad but over the next several days, she developed severe pain.  She started experiencing discoloration.  She subsequently was evaluated at the [facility] [place]  and was diagnosed with complex regional pain syndrome.  In 2019, she began having numbness and tingling in her feet, mostly the right foot.  She would intermittently have a drop foot, causing her leg to give out.  She had a NCV-EMG of the right lower extremity in December 2019 but she was unable to tolerate it and it was abruptly stopped.  NCV showed prolonged distal motor latency and low motor amplitude of the right peroneal  nerve with slowing above and below the fibular head.  Right sural and superficial peroneal nerves were normal.  On needle exam, the right tibialis anterior muscle was normal but she then refused to continue the study.  Lab work included negative workup for autoimmune disease -  including ANA, sed rate, CRP, HLA-B27.  B12, folate, TSH, CK and aldolase have been normal.  She continues to have chronic pain.  She has hypermobility of joints, including the knees.  She has chronic low back pain.  MRI of lumbar spine on 04/01/2023 which showed pars defects causing Grade 1 aterolisthesis of L5-S1 with mild disc bulge with lateral recess stenosis at L5-S1 but no significant spinal canal or neural foraminal stenosis.  She is under the care of pain management.  She still has numbness in the legs below the knees at times, particularly when she stands.     She has had headaches off and on since childhood but she started having more severe and frequent migraines in 2020.  She describes a severe stabbing and pounding pain behind the ears or in the eyes, either side.  There is associated nausea, vomiting, photophobia, phonophobia and osmophobia.  If she is able to take sumatriptan  within 15 minutes of onset, then it resolves within an hour, otherwise it lasts all day.  They occur 2 to 3 days a week.  She also has a daily dull headache for which she takes Excedrin daily.     Also since 2020, she has had recurrent episodes of passing out.  She describes feeling hot and lightheaded with heaviness in her legs and then passes out.  It lasts only a few seconds.  No convulsions, incontinence, tongue/cheek biting.  No postictal confusion.  It occurs 2-3 times a day..  Sometimes preceded briefly by sharp severe headache.  It mostly occurs with change in position, such as bending over, but some are spontaneous.  She has been diagnosed with orthostatic hypotension.  She feels lightheaded when she stands up from sitting.  Unclear if related to polypharmacy.  She has been referred cardiology.  TTE unremarkable with normal EF and no valvular abnormalities.  She was ordered a 14 day ZIO patch but only tolerated 4 days due to skin irritation.  Heart rate demonstrated 43-173bpm with average 93 bpm.  Rare PVCs and PACs.   Events corresponded to sinus rhythm or sinus tachycardia but no SVT.  Cardiology did not suspect POTS, Marfan's or Ehlers Danlos Syndrome.  Due to hitting her head from a fall, she had a CT of head of head without contrast on 08/29/2020 personally reviewed which was slightly limited by streak artifact from metal portions of hair extension but appeared normal.   MRI of brain with and without contrast on 01/18/2021 was normal.  Routine EEG on 01/10/2021 was normal.  48 hour ambulatory EEG in April 2022 was normal.  Captured 3 habitual events without electrographic correlate.    She is treated by Sports Medicine for her chronic back pain.  She has anterolisthesis of L5-S1 due to bilateral pars defects, causing left lateral recess stenosis.  Underwent epidural injection at L5-S1 in July 2024.  She reports episodes of uncontrolled shaking of the leg.  Two of these episodes occurred the previous month in June, prior to the epidural injection.  In June, she had a nasal procedure and it occurred while sitting.  It was thought that it may have been side effect to the epinephrine .  Another time, it occurred at the gym  while she was using a light barbell to perform behind the neck press.  Both legs started to shake.  In July, both legs started shaking while she was sitting.  Later, she was getting a pedicure when her right leg started to shake while her foot was being treated.  It is not just postural or occurs when bearing weight.  It happened while sitting relaxed as well.  No associated pain.       Past NSAIDS/analgesics:  Norco, Excedrin  Past abortive triptans:  sumatriptan  tab, rizatriptan  10mg  Past muscle relaxants:  baclofen  Past anti-emetic:  promethazine  Past antihypertensive medications:  atenolol , metoprolol  Past antidepressant medications:  amitriptyline  Past anticonvulsant medications:  Topiramate , Lyrica , Keppra  (for neuralgia) Past CGRP inhibitor:  Qulipta  (made headaches worse), Aimovig  140mg ,  Emgality  Other past therapies:  prednisone , Botox  (one round)     Family history of headache:  Father (migraines), mother (occasional headaches)  PAST MEDICAL HISTORY: Past Medical History:  Diagnosis Date   Anal pain    Anxiety    Arthritis    Complex regional pain syndrome of both lower extremities    per pt w/ right thigh numbness   Depression    Ehlers-Danlos disease    Exercise-induced asthma    Fainting episodes    Fibromyalgia    GERD (gastroesophageal reflux disease)    History of concussion 04/2019   per pt no residual   History of Osgood-Schlatter disease    History of syncope    ED visit 08-29-2020 w/ cp, palpitations--- pt sent for cardiology evaluation , dr Marven Slimmer, office note 09-07-2020 dx orthostatic intolerence ;   echo in epic 09-14-2020 , normal;  event monitor 09-13-2020 no signfiance arrhythmia , SR/ST;  CT angio chest 09-02-2020  normal   Irritable bowel    Migraine without aura    Orthostatic hypotension    POTS (postural orthostatic tachycardia syndrome)    Sacral radiculopathy    Spondylolisthesis, lumbosacral region    L5--S1   Wears contact lenses     MEDICATIONS: Current Outpatient Medications on File Prior to Visit  Medication Sig Dispense Refill   albuterol  (PROVENTIL  HFA;VENTOLIN  HFA) 108 (90 Base) MCG/ACT inhaler Inhale 2 puffs into the lungs every 6 (six) hours as needed for wheezing or shortness of breath. 1 Inhaler 3   B Complex-C (SUPER B COMPLEX PO) Take by mouth.     bisoprolol  (ZEBETA ) 5 MG tablet TAKE 0.5 TABLETS (2.5 MG TOTAL) BY MOUTH AT BEDTIME. 45 tablet 0   buPROPion  (WELLBUTRIN  XL) 300 MG 24 hr tablet Take 1 tablet by mouth daily.     calcium carbonate (TUMS - DOSED IN MG ELEMENTAL CALCIUM) 500 MG chewable tablet Chew 1 tablet by mouth as needed for indigestion or heartburn.     Cholecalciferol 50 MCG (2000 UT) CAPS Take by mouth.     diclofenac  (CATAFLAM ) 50 MG tablet Take 1 tablet (50 mg total) by mouth 3 (three) times daily  as needed. 90 tablet 5   dicyclomine  (BENTYL ) 20 MG tablet TAKE 1 TABLET EVERY 6 HOURS AS NEEDED FOR ABDOMINAL PAIN     DULoxetine  (CYMBALTA ) 60 MG capsule TAKE 2 CAPSULES BY MOUTH EVERY DAY 180 capsule 1   eletriptan  (RELPAX ) 40 MG tablet Take 1 tablet (40 mg total) by mouth as needed for migraine or headache. May repeat in 2 hours if headache persists or recurs.  Maximum 2 tablets in 24 hours. 10 tablet 5   EPINEPHrine  0.3 mg/0.3 mL IJ SOAJ injection Inject into the  muscle.     estradiol  (ESTRACE  VAGINAL) 0.1 MG/GM vaginal cream Place 0.5 g vaginally at bedtime. Initial dose: nightly x 2 weeks then twice weekly 42.5 g 0   gabapentin  (NEURONTIN ) 100 MG capsule Take 1 capsule twice daily for one week, then 2 capsules twice daily for one week, then 3 capsules twice daily 180 capsule 0   ipratropium (ATROVENT) 0.03 % nasal spray SMARTSIG:1 Spray(s) Both Nares 1-3 Times Daily PRN     ketoconazole (NIZORAL) 2 % shampoo Apply 1 Application topically 3 (three) times a week.     meloxicam  (MOBIC ) 7.5 MG tablet Take 1 tablet (7.5 mg total) by mouth daily. 5 tablet 0   Multiple Vitamin (MULTIVITAMIN) tablet Take 1 tablet by mouth daily.     Norethindrone  Acet-Ethinyl Est (JUNEL 1/20 PO)      norethindrone -ethinyl estradiol  (JUNEL 1/20) 1-20 MG-MCG tablet TAKE 1 TABLET BY MOUTH DAILY. TAKE CONTINUOUSLY, SKIP PLACEBO WEEK 112 tablet 3   Omega-3 Fatty Acids (FISH OIL) 1200 MG CAPS Take by mouth.     ondansetron  (ZOFRAN -ODT) 8 MG disintegrating tablet Take 1 tablet (8 mg total) by mouth every 8 (eight) hours as needed for nausea or vomiting. 20 tablet 5   pramipexole (MIRAPEX) 0.5 MG tablet Take 0.5 mg by mouth at bedtime.     pramipexole (MIRAPEX) 1 MG tablet 1 TAB(S) ORALLY DAILY AT NIGHT 90 DAYS     predniSONE  (DELTASONE ) 10 MG tablet Take 60mg  on day 1, then 50mg  on day 2, then 40mg  on day 3, then 30mg  on day 4, then 20mg  on day 5, then 10mg  on day 6. 21 tablet 0   promethazine  (PHENERGAN ) 25 MG tablet Take  1 tablet (25 mg total) by mouth every 6 (six) hours as needed for nausea or vomiting. 60 tablet 5   Rimegepant Sulfate (NURTEC) 75 MG TBDP Take 1 tablet (75 mg total) by mouth every other day. 16 tablet 5   temazepam (RESTORIL) 15 MG capsule TAKE 1 CAPSULE BY MOUTH EVERY DAY AT BEDTIME     tiZANidine  (ZANAFLEX ) 4 MG tablet TAKE 1/2 TO 1 TABLET BY MOUTH 2 TIMES DAILY AS NEEDED FOR MUSCLE SPASMS. 180 tablet 1   traZODone (DESYREL) 50 MG tablet TAKE 1 TABLET BY MOUTH EVERY DAY FOR 90 DAYS     ZEPBOUND 2.5 MG/0.5ML Pen 0.5 ML SUBCUTANEOUSLY ONCE A WEEK 28 DAYS     ZEPBOUND 5 MG/0.5ML Pen Inject 5 mg into the skin once a week. Every other week     Current Facility-Administered Medications on File Prior to Visit  Medication Dose Route Frequency Provider Last Rate Last Admin   0.9 %  sodium chloride  infusion  500 mL Intravenous Once Armbruster, Lendon Queen, MD        ALLERGIES: Allergies  Allergen Reactions   Gluten Meal     Other reaction(s): GI intolerance Vomiting   Pecan Extract Anaphylaxis and Hives   Strawberry Extract Anaphylaxis and Hives   Beef Allergy Hives    Other reaction(s): GI intolerance RED MEAT   Other Hives    Red Meat  and chicken   Shellfish Allergy Hives    lobster Other reaction(s): Other lobster lobster  lobster Other reaction(s): Other lobster    FAMILY HISTORY: Family History  Problem Relation Age of Onset   Arthritis Mother    Asthma Mother    Miscarriages / [place] Mother    Migraines Mother    COPD Father        per pt's  mother-mild   Hypertension Father    Migraines Father    Asthma Brother    Migraines Brother    Anxiety disorder Brother    Arthritis Maternal Grandmother    Cancer Maternal Grandmother    Miscarriages / Stillbirths Maternal Grandmother    Diabetes Maternal Grandfather    Heart attack Maternal Grandfather    Heart disease Maternal Grandfather    Hyperlipidemia Maternal Grandfather    Hypertension Maternal Grandfather     Stroke Maternal Grandfather    Colon polyps Maternal Grandfather    Cancer Paternal Grandmother    Alcohol abuse Paternal Grandfather    Cancer Paternal Grandfather    COPD Paternal Grandfather    Heart attack Paternal Grandfather    Heart disease Paternal Grandfather    Hyperlipidemia Paternal Grandfather    Hypertension Paternal Grandfather    Asthma Brother    OCD Brother    Anxiety disorder Brother    Seizures Neg Hx    Depression Neg Hx    Bipolar disorder Neg Hx    Schizophrenia Neg Hx    ADD / ADHD Neg Hx    Autism Neg Hx    Colon cancer Neg Hx    Esophageal cancer Neg Hx    Rectal cancer Neg Hx    Stomach cancer Neg Hx       Objective:  Blood pressure 103/69, pulse 85, resp. rate 20, height 5\' 7"  (1.702 m), weight 120 lb (54.4 kg), SpO2 96%. General: No acute distress.  Patient appears well-groomed.   Head:  Normocephalic/atraumatic Neck:  Supple.  Left greater than right paraspinal tenderness.  Full range of motion. Heart:  Regular rate and rhythm. Vascular:  No carotid bruits Neuro:  alert and oriented.  Speech fluent and not dysarthric, language intact.  CN II-XII intact. Bulk and tone normal, muscle strength 5/5 throughout.  Sensation to pinprick and vibration intact.  Deep tendon reflexes 2+ throughout toes downgoing.  Finger to nose testing intact.  Gait normal, Romberg negative.     Janne Members, DO  CC: Osie Bleacher, NP

## 2024-04-05 ENCOUNTER — Ambulatory Visit: Admitting: Neurology

## 2024-04-05 ENCOUNTER — Encounter: Payer: Self-pay | Admitting: Neurology

## 2024-04-11 ENCOUNTER — Ambulatory Visit
Admission: RE | Admit: 2024-04-11 | Discharge: 2024-04-11 | Disposition: A | Discharge status: Home / Self Care | Source: Ambulatory Visit | Attending: Family Medicine | Admitting: Family Medicine

## 2024-04-11 DIAGNOSIS — G8929 Other chronic pain: Secondary | ICD-10-CM

## 2024-04-11 DIAGNOSIS — M79644 Pain in right finger(s): Secondary | ICD-10-CM

## 2024-04-11 MED ORDER — IOPAMIDOL (ISOVUE-M 200) INJECTION 41%
1.0000 mL | Freq: Once | INTRAMUSCULAR | Status: AC
  Administered 2024-04-11: 1 mL via INTRA_ARTICULAR

## 2024-04-15 ENCOUNTER — Ambulatory Visit: Payer: Self-pay | Admitting: Family Medicine

## 2024-04-20 ENCOUNTER — Other Ambulatory Visit: Payer: Self-pay

## 2024-04-20 DIAGNOSIS — M79641 Pain in right hand: Secondary | ICD-10-CM

## 2024-04-20 NOTE — Progress Notes (Unsigned)
Amb re 

## 2024-04-25 ENCOUNTER — Ambulatory Visit

## 2024-05-23 NOTE — Progress Notes (Deleted)
 [facility] [address] [place] [address] Phone: [phone] Subjective:    I'm seeing this patient by the request  of:  Cox, Geni LABOR, NP  CC:   YEP:Dlagzrupcz  03/24/2024 Past medical history significant for hypermobility and I do think that she should be scanned for other things at the moment.  Has had difficulty with depth perception sometimes she states.  Patient will be referred appropriately for further evaluation.     Continuing to have chronic fatigue.  Discussed with her as well as her mother about this.  They did discuss the potential for nicotine patches but did not find any significant good resources for them.  We discussed the possibility of caffeine or some other medications.  Patient could be having a paradoxical effect to be increasing in Cymbalta  and discussed taking 1 pill daily to see if that would be beneficial.  Highly difficult with patient's amount of medications as well as comorbidities.  Will have to see how patient responds to this.  Follow-up with me again in 6 to 8 weeks otherwise.  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  Will get an MRI of the hand and thumb to further evaluate.  Will use arthrogram technique as well to further evaluate the UCL and what would be the next step.  Discussed with patient icing regimen and home exercises otherwise.  Increase activity slowly.  Follow-up again in 6 to 8 weeks.      Update 05/24/2024 Kiara Brewer is a 23 y.o. female coming in with complaint of ***  Onset-  Location Duration-  Character- Aggravating factors- Reliving factors-  Therapies tried-  Severity-     Past Medical History:  Diagnosis Date   Anal pain    Anxiety    Arthritis    Complex regional pain syndrome of both lower  extremities    per pt w/ right thigh numbness   Depression    Ehlers-Danlos disease    Exercise-induced asthma    Fainting episodes    Fibromyalgia    GERD (gastroesophageal reflux disease)    History of concussion 04/2019   per pt no residual   History of Osgood-Schlatter disease    History of syncope    ED visit 08-29-2020 w/ cp, palpitations--- pt sent for cardiology evaluation , dr cindie, office note 09-07-2020 dx orthostatic intolerence ;   echo in epic 09-14-2020 , normal;  event monitor 09-13-2020 no signfiance arrhythmia , SR/ST;  CT angio chest 09-02-2020  normal   Irritable bowel    Migraine without aura    Orthostatic hypotension    POTS (postural orthostatic tachycardia syndrome)    Sacral radiculopathy    Spondylolisthesis, lumbosacral region    L5--S1   Wears contact lenses    Past Surgical History:  Procedure Laterality Date   RECTAL EXAM UNDER ANESTHESIA N/A 11/22/2020   Procedure: ANAL  EXAM UNDER ANESTHESIA;  Surgeon: Debby Hila, MD;  Location: [facility] [place];  Service: General;  Laterality: N/A;   SPHINCTEROTOMY N/A 11/22/2020   Procedure: CHEMICAL SPHINCTEROTOMY/ FISSURECTOMY;  Surgeon: Debby Hila, MD;  Location: [facility] [place];  Service: General;  Laterality: N/A;   TONSILLECTOMY AND ADENOIDECTOMY  2010   WISDOM TOOTH EXTRACTION  2020   Social History   Socioeconomic  History   Marital status: Married    Spouse name: Not on file   Number of children: Not on file   Years of education: Not on file   Highest education level: Not on file  Occupational History   Not on file  Tobacco Use   Smoking status: Never   Smokeless tobacco: Never  Vaping Use   Vaping status: Never Used  Substance and Sexual Activity   Alcohol use: Never   Drug use: Never   Sexual activity: Yes    Partners: Male    Birth control/protection: Pill  Other Topics Concern   Not on file  Social History Narrative   Kiara Brewer is in the 12th grade at [employer];  she does well academically. She lives with her parents. She enjoys going to the gym, boxing, and playing with animals   Right handed   Drinks caffeine   An apartment one story    Social Drivers of Health   Financial Resource Strain: Low Risk  (07/11/2022)   Received from [facility]   Overall Financial Resource Strain (CARDIA)    Difficulty of Paying Living Expenses: Not hard at all  Food Insecurity: No Food Insecurity (08/09/2022)   Hunger Vital Sign    Worried About Running Out of Food in the Last Year: Never true    Ran Out of Food in the Last Year: Never true  Transportation Needs: No Transportation Needs (08/09/2022)   PRAPARE - Administrator, Civil Service (Medical): No    Lack of Transportation (Non-Medical): No  Recent Concern: Transportation Needs - Unmet Transportation Needs (07/11/2022)   Received from [facility] - Transportation    Lack of Transportation (Medical): No    Lack of Transportation (Non-Medical): Yes  Physical Activity: Sufficiently Active (07/11/2022)   Received [facility]   Exercise Vital Sign    On average, how many days per week do you engage in moderate to strenuous exercise (like a brisk walk)?: 5 days    On average, how many minutes do you engage in exercise at this level?: 40 min  Stress: Not on file  Social Connections: Unknown (02/27/2022)   Received from [facility]   Social Network    Social Network: Not on file   Allergies  Allergen Reactions   Gluten Meal     Other reaction(s): GI intolerance Vomiting   Pecan Extract Anaphylaxis and Hives   Strawberry Extract Anaphylaxis and Hives   Beef Allergy Hives    Other reaction(s): GI intolerance RED MEAT   Other Hives    Red Meat  and chicken   Shellfish Allergy Hives    lobster Other reaction(s): Other lobster lobster  lobster Other reaction(s): Other lobster   Vyepti  [Eptinezumab -Jjmr] Rash   Family History  Problem Relation Age of Onset   Arthritis  Mother    Asthma Mother    Miscarriages / [place] Mother    Migraines Mother    COPD Father        per pt's mother-mild   Hypertension Father    Migraines Father    Asthma Brother    Migraines Brother    Anxiety disorder Brother    Arthritis Maternal Grandmother    Cancer Maternal Grandmother    Miscarriages / Stillbirths Maternal Grandmother    Diabetes Maternal Grandfather    Heart attack Maternal Grandfather    Heart disease Maternal Grandfather    Hyperlipidemia Maternal Grandfather    Hypertension Maternal Grandfather  Stroke Maternal Grandfather    Colon polyps Maternal Grandfather    Cancer Paternal Grandmother    Alcohol abuse Paternal Grandfather    Cancer Paternal Grandfather    COPD Paternal Grandfather    Heart attack Paternal Grandfather    Heart disease Paternal Grandfather    Hyperlipidemia Paternal Grandfather    Hypertension Paternal Grandfather    Asthma Brother    OCD Brother    Anxiety disorder Brother    Seizures Neg Hx    Depression Neg Hx    Bipolar disorder Neg Hx    Schizophrenia Neg Hx    ADD / ADHD Neg Hx    Autism Neg Hx    Colon cancer Neg Hx    Esophageal cancer Neg Hx    Rectal cancer Neg Hx    Stomach cancer Neg Hx     Current Outpatient Medications (Endocrine & Metabolic):    Norethindrone  Acet-Ethinyl Est (JUNEL 1/20 PO),    norethindrone -ethinyl estradiol  (JUNEL 1/20) 1-20 MG-MCG tablet, TAKE 1 TABLET BY MOUTH DAILY. TAKE CONTINUOUSLY, SKIP PLACEBO WEEK   predniSONE  (DELTASONE ) 10 MG tablet, Take 60mg  on day 1, then 50mg  on day 2, then 40mg  on day 3, then 30mg  on day 4, then 20mg  on day 5, then 10mg  on day 6.   Current Outpatient Medications (Cardiovascular):    bisoprolol  (ZEBETA ) 5 MG tablet, TAKE 0.5 TABLETS (2.5 MG TOTAL) BY MOUTH AT BEDTIME.   EPINEPHrine  0.3 mg/0.3 mL IJ SOAJ injection, Inject into the muscle.   Current Outpatient Medications (Respiratory):    albuterol  (PROVENTIL  HFA;VENTOLIN  HFA) 108 (90 Base)  MCG/ACT inhaler, Inhale 2 puffs into the lungs every 6 (six) hours as needed for wheezing or shortness of breath.   ipratropium (ATROVENT) 0.03 % nasal spray, SMARTSIG:1 Spray(s) Both Nares 1-3 Times Daily PRN   promethazine  (PHENERGAN ) 25 MG tablet, Take 1 tablet (25 mg total) by mouth every 6 (six) hours as needed for nausea or vomiting.   Current Outpatient Medications (Analgesics):    diclofenac  (CATAFLAM ) 50 MG tablet, Take 1 tablet (50 mg total) by mouth 3 (three) times daily as needed.   eletriptan  (RELPAX ) 40 MG tablet, Take 1 tablet (40 mg total) by mouth as needed for migraine or headache. May repeat in 2 hours if headache persists or recurs.  Maximum 2 tablets in 24 hours.   meloxicam  (MOBIC ) 7.5 MG tablet, Take 1 tablet (7.5 mg total) by mouth daily.   Rimegepant Sulfate (NURTEC) 75 MG TBDP, Take 1 tablet (75 mg total) by mouth every other day.     Current Outpatient Medications (Other):    B Complex-C (SUPER B COMPLEX PO), Take by mouth.   buPROPion  (WELLBUTRIN  XL) 300 MG 24 hr tablet, Take 1 tablet by mouth daily.   calcium carbonate (TUMS - DOSED IN MG ELEMENTAL CALCIUM) 500 MG chewable tablet, Chew 1 tablet by mouth as needed for indigestion or heartburn.   Cholecalciferol 50 MCG (2000 UT) CAPS, Take by mouth.   dicyclomine  (BENTYL ) 20 MG tablet, TAKE 1 TABLET EVERY 6 HOURS AS NEEDED FOR ABDOMINAL PAIN   DULoxetine  (CYMBALTA ) 60 MG capsule, TAKE 2 CAPSULES BY MOUTH EVERY DAY   estradiol  (ESTRACE  VAGINAL) 0.1 MG/GM vaginal cream, Place 0.5 g vaginally at bedtime. Initial dose: nightly x 2 weeks then twice weekly   gabapentin  (NEURONTIN ) 100 MG capsule, Take 1 capsule twice daily for one week, then 2 capsules twice daily for one week, then 3 capsules twice daily   ketoconazole (NIZORAL) 2 %  shampoo, Apply 1 Application topically 3 (three) times a week.   Multiple Vitamin (MULTIVITAMIN) tablet, Take 1 tablet by mouth daily.   Omega-3 Fatty Acids (FISH OIL) 1200 MG CAPS, Take by  mouth.   ondansetron  (ZOFRAN -ODT) 8 MG disintegrating tablet, Take 1 tablet (8 mg total) by mouth every 8 (eight) hours as needed for nausea or vomiting.   pramipexole (MIRAPEX) 0.5 MG tablet, Take 0.5 mg by mouth at bedtime.   pramipexole (MIRAPEX) 1 MG tablet, 1 TAB(S) ORALLY DAILY AT NIGHT 90 DAYS   temazepam (RESTORIL) 15 MG capsule, TAKE 1 CAPSULE BY MOUTH EVERY DAY AT BEDTIME   tiZANidine  (ZANAFLEX ) 4 MG tablet, TAKE 1/2 TO 1 TABLET BY MOUTH 2 TIMES DAILY AS NEEDED FOR MUSCLE SPASMS.   traZODone (DESYREL) 50 MG tablet, TAKE 1 TABLET BY MOUTH EVERY DAY FOR 90 DAYS   ZEPBOUND 2.5 MG/0.5ML Pen, 0.5 ML SUBCUTANEOUSLY ONCE A WEEK 28 DAYS   ZEPBOUND 5 MG/0.5ML Pen, Inject 5 mg into the skin once a week. Every other week  Current Facility-Administered Medications (Other):    0.9 %  sodium chloride  infusion   Reviewed prior external information including notes and imaging from  primary care provider As well as notes that were available from care everywhere and other healthcare systems.  Past medical history, social, surgical and family history all reviewed in electronic medical record.  No pertanent information unless stated regarding to the chief complaint.   Review of Systems:  No headache, visual changes, nausea, vomiting, diarrhea, constipation, dizziness, abdominal pain, skin rash, fevers, chills, night sweats, weight loss, swollen lymph nodes, body aches, joint swelling, chest pain, shortness of breath, mood changes. POSITIVE muscle aches  Objective  There were no vitals taken for this visit.   General: No apparent distress alert and oriented x3 mood and affect normal, dressed appropriately.  HEENT: Pupils equal, extraocular movements intact  Respiratory: Patient's speak in full sentences and does not appear short of breath  Cardiovascular: No lower extremity edema, non tender, no erythema  Thumb pain    Limited muscular skeletal ultrasound was performed and interpreted by CLAUDENE HUSSAR, M     Impression and Recommendations:    The above documentation has been reviewed and is accurate and complete Magon Croson M Tevyn Codd, DO

## 2024-05-24 ENCOUNTER — Ambulatory Visit: Admitting: Family Medicine

## 2024-05-24 ENCOUNTER — Telehealth: Payer: Self-pay | Admitting: Family Medicine

## 2024-05-24 ENCOUNTER — Other Ambulatory Visit: Payer: Self-pay

## 2024-05-24 DIAGNOSIS — S66809A Unspecified injury of other specified muscles, fascia and tendons at wrist and hand level, unspecified hand, initial encounter: Secondary | ICD-10-CM

## 2024-05-24 DIAGNOSIS — G8929 Other chronic pain: Secondary | ICD-10-CM

## 2024-05-24 NOTE — Telephone Encounter (Signed)
 Patient states that she was referred to [facility] for her hand. She saw Dr Alyse who seemed to dismiss everything due to not having images to see?  She asked if the referral could be sent somewhere else?  Please advise.

## 2024-05-28 ENCOUNTER — Other Ambulatory Visit: Payer: Self-pay | Admitting: Physical Medicine and Rehabilitation

## 2024-07-05 NOTE — Progress Notes (Deleted)
 [facility] [address] [place] [address] Phone: [phone] Subjective:    I'm seeing this patient by the request  of:  Cox, Geni LABOR, NP  CC:   YEP:Dlagzrupcz  03/24/2024 Past medical history significant for hypermobility and I do think that she should be scanned for other things at the moment.  Has had difficulty with depth perception sometimes she states.  Patient will be referred appropriately for further evaluation.     Continuing to have chronic fatigue.  Discussed with her as well as her mother about this.  They did discuss the potential for nicotine patches but did not find any significant good resources for them.  We discussed the possibility of caffeine or some other medications.  Patient could be having a paradoxical effect to be increasing in Cymbalta  and discussed taking 1 pill daily to see if that would be beneficial.  Highly difficult with patient's amount of medications as well as comorbidities.  Will have to see how patient responds to this.  Follow-up with me again in 6 to 8 weeks otherwise.  Total time reviewing patient's chart and discussing with patient and her mother 31 minutes      Continues to have right thumb pain, does have significant hypermobility but makes it very difficult to see if patient's UCL is injured. Will get an MRI of the hand and thumb to further evaluate. Will use arthrogram technique as well to further evaluate the UCL and what would be the next step. Discussed with patient icing regimen and home exercises otherwise. Increase activity slowly. Follow-up again in 6 to 8 weeks.   Update 07/07/2024 Kanda Deluna is a 23 y.o. female coming in with complaint of B thumb pain. Was referred to [facility]. Patient states     Past Medical History:  Diagnosis Date   Anal pain    Anxiety    Arthritis    Complex regional pain syndrome of both lower extremities    per pt w/ right thigh numbness   Depression     Ehlers-Danlos disease    Exercise-induced asthma    Fainting episodes    Fibromyalgia    GERD (gastroesophageal reflux disease)    History of concussion 04/2019   per pt no residual   History of Osgood-Schlatter disease    History of syncope    ED visit 08-29-2020 w/ cp, palpitations--- pt sent for cardiology evaluation , dr cindie, office note 09-07-2020 dx orthostatic intolerence ;   echo in epic 09-14-2020 , normal;  event monitor 09-13-2020 no signfiance arrhythmia , SR/ST;  CT angio chest 09-02-2020  normal   Irritable bowel    Migraine without aura    Orthostatic hypotension    POTS (postural orthostatic tachycardia syndrome)    Sacral radiculopathy    Spondylolisthesis, lumbosacral region    L5--S1   Wears contact lenses    Past Surgical History:  Procedure Laterality Date   RECTAL EXAM UNDER ANESTHESIA N/A 11/22/2020   Procedure: ANAL  EXAM UNDER ANESTHESIA;  Surgeon: Debby Hila, MD;  Location: [facility];  Service: General;  Laterality: N/A;   SPHINCTEROTOMY N/A 11/22/2020   Procedure: CHEMICAL SPHINCTEROTOMY/ FISSURECTOMY;  Surgeon: Debby Hila, MD;  Location: [facility];  Service: General;  Laterality: N/A;   TONSILLECTOMY AND ADENOIDECTOMY  2010   WISDOM TOOTH EXTRACTION  2020   Social History   Socioeconomic History   Marital status: Married    Spouse name: Not on file  Number of children: Not on file   Years of education: Not on file   Highest education level: Not on file  Occupational History   Not on file  Tobacco Use   Smoking status: Never   Smokeless tobacco: Never  Vaping Use   Vaping status: Never Used  Substance and Sexual Activity   Alcohol use: Never   Drug use: Never   Sexual activity: Yes    Partners: Male    Birth control/protection: Pill  Other Topics Concern   Not on file  Social History Narrative   Izetta is in the 12th grade at [employer]; she does well academically. She lives with her parents. She  enjoys going to the gym, boxing, and playing with animals   Right handed   Drinks caffeine   An apartment one story    Social Drivers of Health   Financial Resource Strain: Low Risk  (07/11/2022)   Received from [facility]   Overall Financial Resource Strain (CARDIA)    Difficulty of Paying Living Expenses: Not hard at all  Food Insecurity: No Food Insecurity (08/09/2022)   Hunger Vital Sign    Worried About Running Out of Food in the Last Year: Never true    Ran Out of Food in the Last Year: Never true  Transportation Needs: No Transportation Needs (08/09/2022)   PRAPARE - Administrator, Civil Service (Medical): No    Lack of Transportation (Non-Medical): No  Recent Concern: Transportation Needs - Unmet Transportation Needs (07/11/2022)   Received from [facility] - Transportation    Lack of Transportation (Medical): No    Lack of Transportation (Non-Medical): Yes  Physical Activity: Sufficiently Active (07/11/2022)   Received from [facility]   Exercise Vital Sign    On average, how many days per week do you engage in moderate to strenuous exercise (like a brisk walk)?: 5 days    On average, how many minutes do you engage in exercise at this level?: 40 min  Stress: Not on file  Social Connections: Unknown (02/27/2022)   Received from [facility]   Social Network    Social Network: Not on file   Allergies  Allergen Reactions   Gluten Meal     Other reaction(s): GI intolerance Vomiting   Pecan Extract Anaphylaxis and Hives   Strawberry Extract Anaphylaxis and Hives   Beef Allergy Hives    Other reaction(s): GI intolerance RED MEAT   Other Hives    Red Meat  and chicken   Shellfish Allergy Hives    lobster Other reaction(s): Other lobster lobster  lobster Other reaction(s): Other lobster   Vyepti  [Eptinezumab -Jjmr] Rash   Family History  Problem Relation Age of Onset   Arthritis Mother    Asthma Mother    Miscarriages / [place] Mother     Migraines Mother    COPD Father        per pt's mother-mild   Hypertension Father    Migraines Father    Asthma Brother    Migraines Brother    Anxiety disorder Brother    Arthritis Maternal Grandmother    Cancer Maternal Grandmother    Miscarriages / Stillbirths Maternal Grandmother    Diabetes Maternal Grandfather    Heart attack Maternal Grandfather    Heart disease Maternal Grandfather    Hyperlipidemia Maternal Grandfather    Hypertension Maternal Grandfather    Stroke Maternal Grandfather    Colon polyps Maternal Grandfather    Cancer Paternal  Grandmother    Alcohol abuse Paternal Grandfather    Cancer Paternal Grandfather    COPD Paternal Grandfather    Heart attack Paternal Grandfather    Heart disease Paternal Grandfather    Hyperlipidemia Paternal Grandfather    Hypertension Paternal Grandfather    Asthma Brother    OCD Brother    Anxiety disorder Brother    Seizures Neg Hx    Depression Neg Hx    Bipolar disorder Neg Hx    Schizophrenia Neg Hx    ADD / ADHD Neg Hx    Autism Neg Hx    Colon cancer Neg Hx    Esophageal cancer Neg Hx    Rectal cancer Neg Hx    Stomach cancer Neg Hx     Current Outpatient Medications (Endocrine & Metabolic):    Norethindrone  Acet-Ethinyl Est (JUNEL 1/20 PO),    norethindrone -ethinyl estradiol  (JUNEL 1/20) 1-20 MG-MCG tablet, TAKE 1 TABLET BY MOUTH DAILY. TAKE CONTINUOUSLY, SKIP PLACEBO WEEK   predniSONE  (DELTASONE ) 10 MG tablet, Take 60mg  on day 1, then 50mg  on day 2, then 40mg  on day 3, then 30mg  on day 4, then 20mg  on day 5, then 10mg  on day 6.   Current Outpatient Medications (Cardiovascular):    bisoprolol  (ZEBETA ) 5 MG tablet, TAKE 0.5 TABLETS (2.5 MG TOTAL) BY MOUTH AT BEDTIME.   EPINEPHrine  0.3 mg/0.3 mL IJ SOAJ injection, Inject into the muscle.   Current Outpatient Medications (Respiratory):    albuterol  (PROVENTIL  HFA;VENTOLIN  HFA) 108 (90 Base) MCG/ACT inhaler, Inhale 2 puffs into the lungs every 6 (six)  hours as needed for wheezing or shortness of breath.   ipratropium (ATROVENT) 0.03 % nasal spray, SMARTSIG:1 Spray(s) Both Nares 1-3 Times Daily PRN   promethazine  (PHENERGAN ) 25 MG tablet, Take 1 tablet (25 mg total) by mouth every 6 (six) hours as needed for nausea or vomiting.   Current Outpatient Medications (Analgesics):    diclofenac  (CATAFLAM ) 50 MG tablet, Take 1 tablet (50 mg total) by mouth 3 (three) times daily as needed.   eletriptan  (RELPAX ) 40 MG tablet, Take 1 tablet (40 mg total) by mouth as needed for migraine or headache. May repeat in 2 hours if headache persists or recurs.  Maximum 2 tablets in 24 hours.   meloxicam  (MOBIC ) 7.5 MG tablet, Take 1 tablet (7.5 mg total) by mouth daily.   Rimegepant Sulfate (NURTEC) 75 MG TBDP, Take 1 tablet (75 mg total) by mouth every other day.     Current Outpatient Medications (Other):    B Complex-C (SUPER B COMPLEX PO), Take by mouth.   buPROPion  (WELLBUTRIN  XL) 300 MG 24 hr tablet, Take 1 tablet by mouth daily.   calcium carbonate (TUMS - DOSED IN MG ELEMENTAL CALCIUM) 500 MG chewable tablet, Chew 1 tablet by mouth as needed for indigestion or heartburn.   Cholecalciferol 50 MCG (2000 UT) CAPS, Take by mouth.   dicyclomine  (BENTYL ) 20 MG tablet, TAKE 1 TABLET EVERY 6 HOURS AS NEEDED FOR ABDOMINAL PAIN   DULoxetine  (CYMBALTA ) 60 MG capsule, TAKE 2 CAPSULES BY MOUTH EVERY DAY   estradiol  (ESTRACE  VAGINAL) 0.1 MG/GM vaginal cream, Place 0.5 g vaginally at bedtime. Initial dose: nightly x 2 weeks then twice weekly   gabapentin  (NEURONTIN ) 100 MG capsule, Take 1 capsule twice daily for one week, then 2 capsules twice daily for one week, then 3 capsules twice daily   ketoconazole (NIZORAL) 2 % shampoo, Apply 1 Application topically 3 (three) times a week.   Multiple Vitamin (MULTIVITAMIN)  tablet, Take 1 tablet by mouth daily.   Omega-3 Fatty Acids (FISH OIL) 1200 MG CAPS, Take by mouth.   ondansetron  (ZOFRAN -ODT) 8 MG disintegrating  tablet, Take 1 tablet (8 mg total) by mouth every 8 (eight) hours as needed for nausea or vomiting.   pramipexole (MIRAPEX) 0.5 MG tablet, Take 0.5 mg by mouth at bedtime.   pramipexole (MIRAPEX) 1 MG tablet, 1 TAB(S) ORALLY DAILY AT NIGHT 90 DAYS   temazepam (RESTORIL) 15 MG capsule, TAKE 1 CAPSULE BY MOUTH EVERY DAY AT BEDTIME   tiZANidine  (ZANAFLEX ) 4 MG tablet, TAKE 1/2 TO 1 TABLET BY MOUTH 2 TIMES DAILY AS NEEDED FOR MUSCLE SPASMS.   traZODone (DESYREL) 50 MG tablet, TAKE 1 TABLET BY MOUTH EVERY DAY FOR 90 DAYS   ZEPBOUND 2.5 MG/0.5ML Pen, 0.5 ML SUBCUTANEOUSLY ONCE A WEEK 28 DAYS   ZEPBOUND 5 MG/0.5ML Pen, Inject 5 mg into the skin once a week. Every other week  Current Facility-Administered Medications (Other):    0.9 %  sodium chloride  infusion   Reviewed prior external information including notes and imaging from  primary care provider As well as notes that were available from care everywhere and other healthcare systems.  Past medical history, social, surgical and family history all reviewed in electronic medical record.  No pertanent information unless stated regarding to the chief complaint.   Review of Systems:  No headache, visual changes, nausea, vomiting, diarrhea, constipation, dizziness, abdominal pain, skin rash, fevers, chills, night sweats, weight loss, swollen lymph nodes, body aches, joint swelling, chest pain, shortness of breath, mood changes. POSITIVE muscle aches  Objective  There were no vitals taken for this visit.   General: No apparent distress alert and oriented x3 mood and affect normal, dressed appropriately.  HEENT: Pupils equal, extraocular movements intact  Respiratory: Patient's speak in full sentences and does not appear short of breath  Cardiovascular: No lower extremity edema, non tender, no erythema      Impression and Recommendations:    The above documentation has been reviewed and is accurate and complete Mckena Chern M Jetson Pickrel, DO

## 2024-07-06 ENCOUNTER — Encounter: Payer: Self-pay | Admitting: Family Medicine

## 2024-07-07 ENCOUNTER — Ambulatory Visit: Admitting: Family Medicine
